# Patient Record
Sex: Female | Born: 1950 | Race: Black or African American | Hispanic: No | Marital: Single | Smoking: Former smoker
Health system: Southern US, Community
[De-identification: ages and names within clinical notes are randomized; demographics above are authoritative.]

## PROBLEM LIST (undated history)

## (undated) DIAGNOSIS — H409 Unspecified glaucoma: Secondary | ICD-10-CM

## (undated) DIAGNOSIS — I10 Essential (primary) hypertension: Secondary | ICD-10-CM

## (undated) DIAGNOSIS — H269 Unspecified cataract: Secondary | ICD-10-CM

## (undated) DIAGNOSIS — F329 Major depressive disorder, single episode, unspecified: Secondary | ICD-10-CM

## (undated) DIAGNOSIS — E669 Obesity, unspecified: Secondary | ICD-10-CM

## (undated) DIAGNOSIS — E119 Type 2 diabetes mellitus without complications: Secondary | ICD-10-CM

## (undated) DIAGNOSIS — F419 Anxiety disorder, unspecified: Secondary | ICD-10-CM

## (undated) DIAGNOSIS — F32A Depression, unspecified: Secondary | ICD-10-CM

## (undated) DIAGNOSIS — M1990 Unspecified osteoarthritis, unspecified site: Secondary | ICD-10-CM

## (undated) DIAGNOSIS — I639 Cerebral infarction, unspecified: Secondary | ICD-10-CM

## (undated) DIAGNOSIS — E785 Hyperlipidemia, unspecified: Secondary | ICD-10-CM

## (undated) HISTORY — PX: JOINT REPLACEMENT: SHX530

## (undated) HISTORY — DX: Major depressive disorder, single episode, unspecified: F32.9

## (undated) HISTORY — DX: Depression, unspecified: F32.A

## (undated) HISTORY — DX: Anxiety disorder, unspecified: F41.9

## (undated) HISTORY — DX: Type 2 diabetes mellitus without complications: E11.9

## (undated) HISTORY — DX: Essential (primary) hypertension: I10

## (undated) HISTORY — DX: Obesity, unspecified: E66.9

## (undated) HISTORY — PX: TUBAL LIGATION: SHX77

## (undated) HISTORY — DX: Hyperlipidemia, unspecified: E78.5

---

## 1997-05-12 ENCOUNTER — Other Ambulatory Visit: Admission: RE | Admit: 1997-05-12 | Discharge: 1997-05-12 | Payer: Self-pay | Admitting: Family Medicine

## 1998-12-15 ENCOUNTER — Ambulatory Visit (HOSPITAL_COMMUNITY): Admission: RE | Admit: 1998-12-15 | Discharge: 1998-12-15 | Payer: Self-pay | Admitting: Family Medicine

## 1999-04-04 ENCOUNTER — Emergency Department (HOSPITAL_COMMUNITY): Admission: EM | Admit: 1999-04-04 | Discharge: 1999-04-04 | Payer: Self-pay | Admitting: Emergency Medicine

## 1999-04-04 ENCOUNTER — Encounter: Payer: Self-pay | Admitting: Emergency Medicine

## 1999-07-28 ENCOUNTER — Encounter: Payer: Self-pay | Admitting: Emergency Medicine

## 1999-07-28 ENCOUNTER — Emergency Department (HOSPITAL_COMMUNITY): Admission: EM | Admit: 1999-07-28 | Discharge: 1999-07-28 | Payer: Self-pay | Admitting: Emergency Medicine

## 1999-09-08 ENCOUNTER — Encounter: Admission: RE | Admit: 1999-09-08 | Discharge: 1999-12-07 | Payer: Self-pay | Admitting: Orthopedic Surgery

## 1999-10-19 ENCOUNTER — Ambulatory Visit (HOSPITAL_COMMUNITY): Admission: RE | Admit: 1999-10-19 | Discharge: 1999-10-19 | Payer: Self-pay | Admitting: Ophthalmology

## 1999-10-19 ENCOUNTER — Encounter: Payer: Self-pay | Admitting: Ophthalmology

## 1999-12-20 ENCOUNTER — Ambulatory Visit (HOSPITAL_COMMUNITY): Admission: RE | Admit: 1999-12-20 | Discharge: 1999-12-20 | Payer: Self-pay

## 2000-10-04 ENCOUNTER — Emergency Department (HOSPITAL_COMMUNITY): Admission: EM | Admit: 2000-10-04 | Discharge: 2000-10-04 | Payer: Self-pay | Admitting: Emergency Medicine

## 2000-10-20 ENCOUNTER — Ambulatory Visit (HOSPITAL_COMMUNITY): Admission: RE | Admit: 2000-10-20 | Discharge: 2000-10-20 | Payer: Self-pay | Admitting: Family Medicine

## 2001-01-12 ENCOUNTER — Other Ambulatory Visit: Admission: RE | Admit: 2001-01-12 | Discharge: 2001-01-12 | Payer: Self-pay | Admitting: Family Medicine

## 2001-01-25 ENCOUNTER — Ambulatory Visit (HOSPITAL_COMMUNITY): Admission: RE | Admit: 2001-01-25 | Discharge: 2001-01-25 | Payer: Self-pay | Admitting: Family Medicine

## 2003-02-20 ENCOUNTER — Encounter: Admission: RE | Admit: 2003-02-20 | Discharge: 2003-03-04 | Payer: Self-pay | Admitting: Internal Medicine

## 2004-10-31 ENCOUNTER — Emergency Department (HOSPITAL_COMMUNITY): Admission: EM | Admit: 2004-10-31 | Discharge: 2004-10-31 | Payer: Self-pay | Admitting: Emergency Medicine

## 2004-11-01 ENCOUNTER — Ambulatory Visit: Payer: Self-pay | Admitting: Nurse Practitioner

## 2004-11-10 ENCOUNTER — Ambulatory Visit (HOSPITAL_COMMUNITY): Admission: RE | Admit: 2004-11-10 | Discharge: 2004-11-10 | Payer: Self-pay | Admitting: Emergency Medicine

## 2004-11-15 ENCOUNTER — Ambulatory Visit: Payer: Self-pay | Admitting: Nurse Practitioner

## 2004-11-29 ENCOUNTER — Ambulatory Visit: Payer: Self-pay | Admitting: Nurse Practitioner

## 2004-12-28 ENCOUNTER — Ambulatory Visit: Payer: Self-pay | Admitting: Nurse Practitioner

## 2005-01-11 ENCOUNTER — Ambulatory Visit: Payer: Self-pay | Admitting: Nurse Practitioner

## 2005-02-11 ENCOUNTER — Ambulatory Visit: Payer: Self-pay | Admitting: Nurse Practitioner

## 2005-03-29 ENCOUNTER — Ambulatory Visit: Payer: Self-pay | Admitting: Nurse Practitioner

## 2005-09-20 ENCOUNTER — Ambulatory Visit: Payer: Self-pay | Admitting: Nurse Practitioner

## 2005-10-20 ENCOUNTER — Ambulatory Visit: Payer: Self-pay | Admitting: Nurse Practitioner

## 2005-10-24 ENCOUNTER — Ambulatory Visit (HOSPITAL_COMMUNITY): Admission: RE | Admit: 2005-10-24 | Discharge: 2005-10-24 | Payer: Self-pay | Admitting: Internal Medicine

## 2005-10-24 DIAGNOSIS — M949 Disorder of cartilage, unspecified: Secondary | ICD-10-CM

## 2005-10-24 DIAGNOSIS — M899 Disorder of bone, unspecified: Secondary | ICD-10-CM | POA: Insufficient documentation

## 2006-05-19 ENCOUNTER — Ambulatory Visit: Payer: Self-pay | Admitting: Family Medicine

## 2006-06-06 ENCOUNTER — Encounter (INDEPENDENT_AMBULATORY_CARE_PROVIDER_SITE_OTHER): Payer: Self-pay | Admitting: Nurse Practitioner

## 2006-06-06 ENCOUNTER — Other Ambulatory Visit: Admission: RE | Admit: 2006-06-06 | Discharge: 2006-06-06 | Payer: Self-pay | Admitting: Family Medicine

## 2006-06-06 ENCOUNTER — Ambulatory Visit: Payer: Self-pay | Admitting: Nurse Practitioner

## 2006-10-26 ENCOUNTER — Ambulatory Visit (HOSPITAL_COMMUNITY): Admission: RE | Admit: 2006-10-26 | Discharge: 2006-10-26 | Payer: Self-pay | Admitting: Family Medicine

## 2007-02-15 ENCOUNTER — Ambulatory Visit: Payer: Self-pay | Admitting: Internal Medicine

## 2007-09-19 ENCOUNTER — Encounter (INDEPENDENT_AMBULATORY_CARE_PROVIDER_SITE_OTHER): Payer: Self-pay | Admitting: Family Medicine

## 2007-09-19 ENCOUNTER — Ambulatory Visit: Payer: Self-pay | Admitting: Family Medicine

## 2007-09-19 LAB — CONVERTED CEMR LAB
AST: 15 units/L (ref 0–37)
Alkaline Phosphatase: 73 units/L (ref 39–117)
BUN: 24 mg/dL — ABNORMAL HIGH (ref 6–23)
Basophils Absolute: 0 10*3/uL (ref 0.0–0.1)
Basophils Relative: 0 % (ref 0–1)
Creatinine, Ser: 1.1 mg/dL (ref 0.40–1.20)
Eosinophils Relative: 2 % (ref 0–5)
HCT: 41.1 % (ref 36.0–46.0)
HDL: 56 mg/dL (ref >39)
Hemoglobin: 13 g/dL (ref 12.0–15.0)
LDL Cholesterol: 117 mg/dL — ABNORMAL HIGH (ref 0–99)
MCHC: 31.6 g/dL (ref 30.0–36.0)
Monocytes Absolute: 0.5 10*3/uL (ref 0.1–1.0)
RDW: 14 % (ref 11.5–15.5)
TSH: 0.536 microintl units/mL (ref 0.350–4.50)
Total CHOL/HDL Ratio: 3.4

## 2007-09-24 ENCOUNTER — Ambulatory Visit: Payer: Self-pay | Admitting: Internal Medicine

## 2007-09-25 ENCOUNTER — Encounter (INDEPENDENT_AMBULATORY_CARE_PROVIDER_SITE_OTHER): Payer: Self-pay | Admitting: Internal Medicine

## 2007-09-28 ENCOUNTER — Telehealth (INDEPENDENT_AMBULATORY_CARE_PROVIDER_SITE_OTHER): Payer: Self-pay | Admitting: Internal Medicine

## 2007-10-03 ENCOUNTER — Encounter (INDEPENDENT_AMBULATORY_CARE_PROVIDER_SITE_OTHER): Payer: Self-pay | Admitting: Internal Medicine

## 2007-10-09 ENCOUNTER — Encounter (INDEPENDENT_AMBULATORY_CARE_PROVIDER_SITE_OTHER): Payer: Self-pay | Admitting: Nurse Practitioner

## 2007-10-31 ENCOUNTER — Ambulatory Visit: Payer: Self-pay | Admitting: Internal Medicine

## 2007-10-31 DIAGNOSIS — I10 Essential (primary) hypertension: Secondary | ICD-10-CM | POA: Insufficient documentation

## 2007-11-08 ENCOUNTER — Encounter (INDEPENDENT_AMBULATORY_CARE_PROVIDER_SITE_OTHER): Payer: Self-pay | Admitting: Internal Medicine

## 2007-11-08 ENCOUNTER — Telehealth (INDEPENDENT_AMBULATORY_CARE_PROVIDER_SITE_OTHER): Payer: Self-pay | Admitting: *Deleted

## 2007-11-08 ENCOUNTER — Ambulatory Visit (HOSPITAL_COMMUNITY): Admission: RE | Admit: 2007-11-08 | Discharge: 2007-11-08 | Payer: Self-pay | Admitting: Family Medicine

## 2007-11-09 ENCOUNTER — Telehealth (INDEPENDENT_AMBULATORY_CARE_PROVIDER_SITE_OTHER): Payer: Self-pay | Admitting: Internal Medicine

## 2007-11-22 ENCOUNTER — Emergency Department (HOSPITAL_COMMUNITY): Admission: EM | Admit: 2007-11-22 | Discharge: 2007-11-22 | Payer: Self-pay | Admitting: Emergency Medicine

## 2007-12-07 ENCOUNTER — Ambulatory Visit: Payer: Self-pay | Admitting: Internal Medicine

## 2007-12-11 ENCOUNTER — Encounter (INDEPENDENT_AMBULATORY_CARE_PROVIDER_SITE_OTHER): Payer: Self-pay | Admitting: Internal Medicine

## 2007-12-11 LAB — CONVERTED CEMR LAB: Calcium: 9.8 mg/dL (ref 8.4–10.5)

## 2008-04-04 ENCOUNTER — Ambulatory Visit: Payer: Self-pay | Admitting: Internal Medicine

## 2008-04-04 ENCOUNTER — Other Ambulatory Visit: Admission: RE | Admit: 2008-04-04 | Discharge: 2008-04-04 | Payer: Self-pay | Admitting: Internal Medicine

## 2008-04-04 ENCOUNTER — Encounter (INDEPENDENT_AMBULATORY_CARE_PROVIDER_SITE_OTHER): Payer: Self-pay | Admitting: Internal Medicine

## 2008-04-04 DIAGNOSIS — F319 Bipolar disorder, unspecified: Secondary | ICD-10-CM | POA: Insufficient documentation

## 2008-04-04 LAB — CONVERTED CEMR LAB
ALT: 27 units/L (ref 0–35)
AST: 22 units/L (ref 0–37)
Albumin: 4.4 g/dL (ref 3.5–5.2)
Alkaline Phosphatase: 105 units/L (ref 39–117)
Bilirubin Urine: NEGATIVE
Glucose, Bld: 78 mg/dL (ref 70–99)
Ketones, urine, test strip: NEGATIVE
Potassium: 4.2 meq/L (ref 3.5–5.3)
Sodium: 141 meq/L (ref 135–145)
Total Protein: 7.3 g/dL (ref 6.0–8.3)
Urobilinogen, UA: 0.2
pH: 5

## 2008-04-13 ENCOUNTER — Encounter (INDEPENDENT_AMBULATORY_CARE_PROVIDER_SITE_OTHER): Payer: Self-pay | Admitting: Internal Medicine

## 2008-10-31 ENCOUNTER — Telehealth (INDEPENDENT_AMBULATORY_CARE_PROVIDER_SITE_OTHER): Payer: Self-pay | Admitting: Internal Medicine

## 2008-11-05 ENCOUNTER — Encounter (INDEPENDENT_AMBULATORY_CARE_PROVIDER_SITE_OTHER): Payer: Self-pay | Admitting: Internal Medicine

## 2008-11-10 ENCOUNTER — Ambulatory Visit (HOSPITAL_COMMUNITY): Admission: RE | Admit: 2008-11-10 | Discharge: 2008-11-10 | Payer: Self-pay | Admitting: Internal Medicine

## 2009-04-08 ENCOUNTER — Telehealth: Payer: Self-pay | Admitting: Physician Assistant

## 2009-05-05 ENCOUNTER — Ambulatory Visit: Payer: Self-pay | Admitting: Internal Medicine

## 2009-05-05 DIAGNOSIS — M25569 Pain in unspecified knee: Secondary | ICD-10-CM

## 2009-05-05 DIAGNOSIS — R635 Abnormal weight gain: Secondary | ICD-10-CM

## 2009-05-05 DIAGNOSIS — R809 Proteinuria, unspecified: Secondary | ICD-10-CM | POA: Insufficient documentation

## 2009-05-05 LAB — CONVERTED CEMR LAB
ALT: 13 units/L (ref 0–35)
Alkaline Phosphatase: 123 units/L — ABNORMAL HIGH (ref 39–117)
Basophils Absolute: 0 10*3/uL (ref 0.0–0.1)
Basophils Relative: 1 % (ref 0–1)
Creatinine, Ser: 0.94 mg/dL (ref 0.40–1.20)
LDL Cholesterol: 142 mg/dL — ABNORMAL HIGH (ref 0–99)
MCHC: 30.6 g/dL (ref 30.0–36.0)
Neutro Abs: 2.1 10*3/uL (ref 1.7–7.7)
Neutrophils Relative %: 49 % (ref 43–77)
Platelets: 297 10*3/uL (ref 150–400)
RBC: 5 M/uL (ref 3.87–5.11)
RDW: 15.4 % (ref 11.5–15.5)
Sodium: 142 meq/L (ref 135–145)
Total Bilirubin: 0.5 mg/dL (ref 0.3–1.2)
Total CHOL/HDL Ratio: 4.8
Total Protein: 7 g/dL (ref 6.0–8.3)
VLDL: 20 mg/dL (ref 0–40)

## 2009-05-08 ENCOUNTER — Encounter (INDEPENDENT_AMBULATORY_CARE_PROVIDER_SITE_OTHER): Payer: Self-pay | Admitting: Internal Medicine

## 2009-05-13 ENCOUNTER — Ambulatory Visit (HOSPITAL_COMMUNITY): Admission: RE | Admit: 2009-05-13 | Discharge: 2009-05-13 | Payer: Self-pay | Admitting: Internal Medicine

## 2009-05-14 ENCOUNTER — Ambulatory Visit: Payer: Self-pay | Admitting: Internal Medicine

## 2009-05-14 LAB — CONVERTED CEMR LAB
Bilirubin Urine: NEGATIVE
Ketones, urine, test strip: NEGATIVE
Nitrite: NEGATIVE
Specific Gravity, Urine: <1.005
pH: 5

## 2009-05-21 DIAGNOSIS — IMO0002 Reserved for concepts with insufficient information to code with codable children: Secondary | ICD-10-CM | POA: Insufficient documentation

## 2009-05-21 DIAGNOSIS — M1710 Unilateral primary osteoarthritis, unspecified knee: Secondary | ICD-10-CM

## 2009-05-22 ENCOUNTER — Ambulatory Visit: Payer: Self-pay | Admitting: Internal Medicine

## 2009-06-21 ENCOUNTER — Encounter (INDEPENDENT_AMBULATORY_CARE_PROVIDER_SITE_OTHER): Payer: Self-pay | Admitting: Internal Medicine

## 2009-06-21 DIAGNOSIS — N259 Disorder resulting from impaired renal tubular function, unspecified: Secondary | ICD-10-CM | POA: Insufficient documentation

## 2009-06-21 LAB — CONVERTED CEMR LAB
Collection Interval-CRCL: 24 hr
Creatinine 24 HR UR: 707 mg/24hr (ref 700–1800)
Creatinine Clearance: 53 mL/min — ABNORMAL LOW (ref 75–115)

## 2009-07-07 ENCOUNTER — Other Ambulatory Visit: Admission: RE | Admit: 2009-07-07 | Discharge: 2009-07-07 | Payer: Self-pay | Admitting: Internal Medicine

## 2009-07-07 ENCOUNTER — Ambulatory Visit: Payer: Self-pay | Admitting: Internal Medicine

## 2009-07-07 DIAGNOSIS — M2550 Pain in unspecified joint: Secondary | ICD-10-CM | POA: Insufficient documentation

## 2009-07-07 DIAGNOSIS — N898 Other specified noninflammatory disorders of vagina: Secondary | ICD-10-CM | POA: Insufficient documentation

## 2009-07-07 LAB — CONVERTED CEMR LAB
Anti Nuclear Antibody(ANA): NEGATIVE
Bilirubin Urine: NEGATIVE
Chlamydia, DNA Probe: NEGATIVE
GC Probe Amp, Genital: NEGATIVE
Ketones, urine, test strip: NEGATIVE
Nitrite: NEGATIVE
Protein, U semiquant: >=300
Rhuematoid fact SerPl-aCnc: 20 intl units/mL (ref 0–20)
Sed Rate: 14 mm/hr (ref 0–22)
Urobilinogen, UA: 0.2
Whiff Test: NEGATIVE
pH: 5.5

## 2009-07-10 ENCOUNTER — Ambulatory Visit (HOSPITAL_COMMUNITY): Admission: RE | Admit: 2009-07-10 | Discharge: 2009-07-10 | Payer: Self-pay | Admitting: Internal Medicine

## 2009-07-14 ENCOUNTER — Encounter (INDEPENDENT_AMBULATORY_CARE_PROVIDER_SITE_OTHER): Payer: Self-pay | Admitting: Internal Medicine

## 2009-07-21 ENCOUNTER — Ambulatory Visit: Payer: Self-pay | Admitting: Internal Medicine

## 2009-07-23 LAB — CONVERTED CEMR LAB
BUN: 11 mg/dL (ref 6–23)
Calcium: 9.6 mg/dL (ref 8.4–10.5)
Creatinine, Ser: 0.86 mg/dL (ref 0.40–1.20)
Glucose, Bld: 106 mg/dL — ABNORMAL HIGH (ref 70–99)
Potassium: 3.8 meq/L (ref 3.5–5.3)

## 2009-08-03 ENCOUNTER — Telehealth (INDEPENDENT_AMBULATORY_CARE_PROVIDER_SITE_OTHER): Payer: Self-pay | Admitting: Internal Medicine

## 2009-08-04 ENCOUNTER — Ambulatory Visit: Payer: Self-pay | Admitting: Internal Medicine

## 2009-08-04 LAB — CONVERTED CEMR LAB
BUN: 16 mg/dL (ref 6–23)
CO2: 24 meq/L (ref 19–32)
Calcium: 10.2 mg/dL (ref 8.4–10.5)
Chloride: 104 meq/L (ref 96–112)
Creatinine, Ser: 0.7 mg/dL (ref 0.40–1.20)
Glucose, Bld: 75 mg/dL (ref 70–99)
Potassium: 4 meq/L (ref 3.5–5.3)
Sodium: 140 meq/L (ref 135–145)

## 2009-10-16 ENCOUNTER — Ambulatory Visit: Payer: Self-pay | Admitting: Internal Medicine

## 2009-10-16 DIAGNOSIS — K089 Disorder of teeth and supporting structures, unspecified: Secondary | ICD-10-CM | POA: Insufficient documentation

## 2009-11-13 ENCOUNTER — Ambulatory Visit (HOSPITAL_COMMUNITY): Admission: RE | Admit: 2009-11-13 | Discharge: 2009-11-13 | Payer: Self-pay | Admitting: Internal Medicine

## 2009-11-17 ENCOUNTER — Ambulatory Visit: Payer: Self-pay | Admitting: Internal Medicine

## 2009-11-17 LAB — CONVERTED CEMR LAB
CO2: 26 meq/L (ref 19–32)
Calcium: 9.7 mg/dL (ref 8.4–10.5)
Chloride: 106 meq/L (ref 96–112)
Glucose, Bld: 94 mg/dL (ref 70–99)
Potassium: 3.8 meq/L (ref 3.5–5.3)
Sodium: 141 meq/L (ref 135–145)

## 2009-11-27 ENCOUNTER — Ambulatory Visit: Payer: Self-pay | Admitting: Internal Medicine

## 2009-12-28 ENCOUNTER — Telehealth (INDEPENDENT_AMBULATORY_CARE_PROVIDER_SITE_OTHER): Payer: Self-pay | Admitting: Internal Medicine

## 2010-01-21 ENCOUNTER — Ambulatory Visit
Admission: RE | Admit: 2010-01-21 | Discharge: 2010-01-21 | Payer: Self-pay | Source: Home / Self Care | Attending: Internal Medicine | Admitting: Internal Medicine

## 2010-02-22 ENCOUNTER — Ambulatory Visit: Admit: 2010-02-22 | Payer: Self-pay | Admitting: Internal Medicine

## 2010-02-23 NOTE — Assessment & Plan Note (Signed)
Summary: 1 WEEK FU FOR BP CHECK///KT  Nurse Visit   Vital Signs:  Patient profile:   60 year old female Menstrual status:  postmenopausal Pulse rate:   56 / minute Pulse rhythm:   regular Resp:     20 per minute BP sitting:   156 / 92  (right arm) Cuff size:   large  Vitals Entered By: Dutch Quint RN (November 27, 2009 11:51 AM)  Impression & Recommendations:  Problem # 1:  HYPERTENSION (ICD-401.9) Rx was not changed as per plan due to low pulse Repeat blood pressure and pulse check in one week. Asymptomatic   The following medications were removed from the medication list:    Clonidine Hcl 0.2 Mg Tabs (Clonidine hcl) .Marland Kitchen... Take one tablet by mouth two times a day for blood pressure Her updated medication list for this problem includes:    Lisinopril 40 Mg Tabs (Lisinopril) .Marland Kitchen... 1 tab by mouth daily    Amlodipine Besylate 10 Mg Tabs (Amlodipine besylate) .Marland Kitchen... 1 tab by mouth daily    Atenolol 50 Mg Tabs (Atenolol) .Marland Kitchen... 1 tab daily by mouth    Clonidine Hcl 0.1 Mg Tabs (Clonidine hcl) .Marland Kitchen... Take one tablet by mouth in the morning and two at nght for blood pressure  Complete Medication List: 1)  Clonazepam 1 Mg Tabs (Clonazepam) .Marland Kitchen.. 1 tab by mouth at hs  dr. Katrinka Blazing 2)  Lisinopril 40 Mg Tabs (Lisinopril) .Marland Kitchen.. 1 tab by mouth daily 3)  Amlodipine Besylate 10 Mg Tabs (Amlodipine besylate) .Marland Kitchen.. 1 tab by mouth daily 4)  Atenolol 50 Mg Tabs (Atenolol) .Marland Kitchen.. 1 tab daily by mouth 5)  Seroquel Xr 150 Mg Xr24h-tab (Quetiapine fumarate) .Marland Kitchen.. 1 tab by mouth at 7 pm dr. Katrinka Blazing 6)  Sertraline Hcl 50 Mg Tabs (Sertraline hcl) .Marland Kitchen.. 1 tab by mouth q hs dr. Katrinka Blazing 7)  Clonidine Hcl 0.1 Mg Tabs (Clonidine hcl) .... Take one tablet by mouth in the morning and two at nght for blood pressure   Patient Instructions: 1)  Reviewed with Dr. Delrae Alfred 2)  Your blood pressure is still high today 3)  Return in one week for blood pressure and pulse check with triage nurse. 4)  Call if anything changes or  if you have any questions.   CC:  BP recheck.  History of Present Illness: Took all medications about 9:30 am.  On 11/20/09 BP was 178/100.  States asymptomatic.  Denies cough, headache, peripheral edema.   Review of Systems CV:  States asymptomatic.   Physical Exam  Lungs:  normal respiratory effort, normal breath sounds, no crackles, and no wheezes.   Heart:  normal rate and regular rhythm.    CC: BP recheck Is Patient Diabetic? No Pain Assessment Patient in pain? yes     Location: left arm, hand, knee Type: aching Onset of pain  Chronic  Does patient need assistance? Functional Status Self care Ambulation Normal   Allergies: 1)  ! Codeine  Orders Added: 1)  Est. Patient Level I [60454] Prescriptions: CLONIDINE HCL 0.2 MG TABS (CLONIDINE HCL) Take one tablet by mouth two times a day for blood pressure  #60 x 3   Entered by:   Dutch Quint RN   Authorized by:   Julieanne Manson MD   Signed by:   Dutch Quint RN on 11/27/2009   Method used:   Electronically to        [facility] [id number] (retail)       [phone]  [address]       [place], [place]  [address]       Ph: [phone]       Fax: [phone]   RxID:   [id number]

## 2010-02-23 NOTE — Progress Notes (Signed)
Summary: MEDS REFILL  Phone Note Refill Request Call back at [phone]   Refills Requested: Medication #1:  SERTRALINE HCL 50 MG TABS 1 tab by mouth q hs Dr. Katrinka Blazing [facility]   Initial call taken by: Domenic Polite,  December 28, 2009 12:00 PM  Follow-up for Phone Call        Pt[facility] -- advised that she needs to call there for refill.  Dutch Quint RN  December 28, 2009 5:51 PM

## 2010-02-23 NOTE — Progress Notes (Signed)
Summary: Legs are swelling  Phone Note Call from Patient   Summary of Call: SHE IS HAVEN'T A HARD TIME,WITH STRESS .THE PILLS SHE IS TAKING IS MAKING HER LEGS SWELLING.HAS BEEN FACING DEATH IN HER FAMILY Initial call taken by: Arta Bruce,  August 03, 2009 4:26 PM  Follow-up for Phone Call        PHONE  [phone]   Follow-up by: Arta Bruce,  August 03, 2009 4:27 PM  Additional Follow-up for Phone Call Additional follow up Details #1::        Has appt. for BP and BMET 08/04/09-- stressed importance that she keep appt. -- requested to come in around 11:30  in the AM -- advised that we will fit her in, as her sister is in the hospital and she has several family issues going on -- not sure if she can make the afternoon appt.  Dutch Quint RN  August 03, 2009 5:49 PM     Additional Follow-up for Phone Call Additional follow up Details #2::    In office this morning for nurse visit.  Dutch Quint RN  August 04, 2009 11:17 AM  Follow-up by: Dutch Quint RN,  August 04, 2009 11:17 AM

## 2010-02-23 NOTE — Assessment & Plan Note (Signed)
Summary: CPP EXAM///GK   Vital Signs:  Patient profile:   60 year old female Menstrual status:  postmenopausal Weight:      185 pounds Temp:     97.8 degrees F Pulse rate:   51 / minute Pulse rhythm:   regular Resp:     20 per minute BP sitting:   172 / 100  (left arm) Cuff size:   regular  Vitals Entered By: Vesta Mixer CMA (July 07, 2009 11:07 AM) CC: CPP.  Last mammo October 2010. Is Patient Diabetic? No Pain Assessment Patient in pain? no       Does patient need assistance? Ambulation Normal   CC:  CPP.  Last mammo October 2010.Marland Kitchen  History of Present Illness: 60 yo female here for CPP.  Concerns:    1.  Arthritis :  feet and fingers still bothering her.  Has not tried Tylenol or ibuprofen.  Really started bothering her more so 1 month ago.    2.  Hypertension:  stressed.  Has not missed meds.  Lost sister-in-law in MVA last month, nephew died of cancer recently.  Sister (pt's niece) of nephew--her daughter just severely injured in MVA yesterday.    Habits & Providers  Alcohol-Tobacco-Diet     Alcohol drinks/day: 0     Tobacco Status: quit maybe in 2006     Year Started: started in 20s  Exercise-Depression-Behavior     Drug Use: never  Allergies (verified): 1)  ! Codeine  Past History:  Past Medical History: RENAL INSUFFICIENCY (ICD-588.9) DEGENERATIVE JOINT DISEASE, LEFT KNEE (ICD-715.96) PROTEINURIA (ICD-791.0) WEIGHT GAIN (ICD-783.1) KNEE PAIN, LEFT (ICD-719.46) ROUTINE GYNECOLOGICAL EXAMINATION (ICD-V72.31) ENCOUNTER FOR LONG-TERM USE OF OTHER MEDICATIONS (ICD-V58.69) BIPOLAR DISORDER UNSPECIFIED (ICD-296.80) OSTEOPENIA (ICD-733.90) HYPERTENSION (ICD-401.9) HYPERCALCEMIA (ICD-275.42)    Past Surgical History: Reviewed history from 04/04/2008 and no changes required. 1.  1983:  BTL  Family History: Reviewed history from 04/04/2008 and no changes required. Mother, died 26s:  Hypertension--unknown cause of death--pt. was young. Father,  died unknown age--pt. was young 21 Brothers:  one living.  Twin brother hit by a train-age 70.  Others died secondary to alcoholism, diabetic complications.  Brother living with DM, on dialysis 2 sisters:  1 with DM.  1 with multiple unknown health concerns. 3 sets of twins in pt's siblings 4 children:  43, 41, 39, and 27.  All healthy  Social History: Divorced Lives alone--but now caring for son's child--child now in custody of pt's daughter.  Baby's mother a drug addict.   Currently not working Worked in [employer] previously. No partner. Not sexually active Quit smoking many years ago.  Cannot say how many years she smoked. No exerciseSmoking Status:  quit maybe in 2006 Drug Use:  never  Review of Systems General:  Energy up and down.. Eyes:  reading glasses--stable with those. ENT:  Denies decreased hearing. CV:  Denies chest pain or discomfort and palpitations. Resp:  Dyspnea when tired and walks a lot;. GI:  Denies abdominal pain, bloody stools, constipation, dark tarry stools, and diarrhea. GU:  Complains of urinary frequency; denies dysuria; white vaginal discharge with itching.  Bad odor.  Has noted for about 3 weeks.. MS:  See HPI--hands, feet and knees with discomfort.  Never red and swollen.. Derm:  Denies lesion(s) and rash. Neuro:  Denies numbness, tingling, and weakness. Psych:  Scored 10 on PQH 9.  Working on this with [employer].Marland Kitchen  Physical Exam  General:  overweight, NAD Head:  Normocephalic and atraumatic  without obvious abnormalities. No apparent alopecia or balding. Eyes:  No corneal or conjunctival inflammation noted. EOMI. Perrla. Funduscopic exam benign, without hemorrhages, exudates or papilledema. Vision grossly normal. Ears:  External ear exam shows no significant lesions or deformities.  Otoscopic examination reveals clear canals, tympanic membranes are intact bilaterally without bulging, retraction, inflammation or discharge. Hearing is  grossly normal bilaterally. Nose:  External nasal examination shows no deformity or inflammation. Nasal mucosa are pink and moist without lesions or exudates. Mouth:  Oral mucosa and oropharynx without lesions or exudates.  poor dentition.   Neck:  No deformities, masses, or tenderness noted. Breasts:  No mass, nodules, thickening, tenderness, bulging, retraction, inflamation, nipple discharge or skin changes noted.   Lungs:  Normal respiratory effort, chest expands symmetrically. Lungs are clear to auscultation, no crackles or wheezes. Heart:  Normal rate and regular rhythm. S1 and S2 normal without gallop, murmur, click, rub or other extra sounds. Abdomen:  Abdomen obese and protuberant.soft, non-tender, normal bowel sounds, no distention, no masses, no hepatomegaly, and no splenomegaly.   Rectal:  No external abnormalities noted. Normal sphincter tone. No rectal masses or tenderness.  Heme negative light brown stool. Genitalia:  Pelvic Exam:        External: normal female genitalia without lesions or masses        Vagina: normal without lesions or masses.  Light yellow frothy discharge without odor        Cervix: normal without lesions or masses        Adnexa: normal bimanual exam without masses or fullness        Uterus: Either enlarged or quite anteverted or both.        Pap smear: performed Msk:  No deformity or scoliosis noted of thoracic or lumbar spine.   Pulses:  R and L carotid,radial,femoral,dorsalis pedis and posterior tibial pulses are full and equal bilaterally Extremities:  No obvious synovial thickening of any of joints of fingers, hand, wrist, toes, feet , or ankles, but MCPs of hands, particularly of fingers 2-4 appear hypertrophied, possibly with ulnar deviation of fingers on right.  Knees with hypertrophic changes as well. Neurologic:  No cranial nerve deficits noted. Station and gait are normal. Plantar reflexes are down-going bilaterally. DTRs are symmetrical throughout.  Sensory, motor and coordinative functions appear intact. Skin:  Intact without suspicious lesions or rashes Cervical Nodes:  No lymphadenopathy noted Axillary Nodes:  No palpable lymphadenopathy Inguinal Nodes:  No significant adenopathy Psych:  Cognition and judgment appear intact. Alert and cooperative with normal attention span and concentration. No apparent delusions, illusions, hallucinations   Impression & Recommendations:  Problem # 1:  ROUTINE GYNECOLOGICAL EXAMINATION (ICD-V72.31)  Orders: Mammogram (Screening) (Mammo)--for October KOH/ [place] [id number]) Pap Smear, Thin Prep ( Collection of) (J8119) UA Dipstick w/o Micro (manual) (14782) T-Pap Smear, Thin Prep (95621)  Problem # 2:  HEALTH SCREENING (ICD-V70.0) Guaiac cards x 3  to return in 2 weeks. Immunizations up to date Orders: Gastroenterology Referral (GI) for screening colonoscopy  Problem # 3:  HYPERTENSION (ICD-401.9) Not controlled. Switch Nifediac to Amlodipine 10 mg Discussed importance of controlling bp to prevent progression of Renal insufficiency Her updated medication list for this problem includes:    Clonidine Hcl 0.1 Mg Tabs (Clonidine hcl) .Marland Kitchen... Take one tab every night at bedtime    Lisinopril 20 Mg Tabs (Lisinopril) .Marland Kitchen... Take one tab once daily    Amlodipine Besylate 10 Mg Tabs (Amlodipine besylate) .Marland Kitchen... 1 tab by mouth daily  Atenolol 50 Mg Tabs (Atenolol) .Marland Kitchen... 1 tab daily by mouth  Problem # 4:  ARTHRALGIA (ICD-719.40) Suspect OA, but some minimal changes to suggest inflammatory arthritis. Orders: T-Rheumatoid Factor [phone]) T-Antinuclear Antib (ANA) [id number]) T-Sed Rate (Automated) [id number])  Problem # 5:  VAGINAL DISCHARGE (ICD-623.5) No defintive dx via wet prep Orders: T- GC Chlamydia (57846) T-HIV Antibody  (Reflex) ([id number])  Problem # 6:  UTERINE ENLARGEMENT (ICD-621.2)  Orders: Ultrasound (Ultrasound)  Problem # 7:  OSTEOPENIA  (ICD-733.90)  Orders: Dexa scan (Dexa scan)  Complete Medication List: 1)  Clonazepam 1 Mg Tabs (Clonazepam) .Marland Kitchen.. 1 tab by mouth at hs  dr. Katrinka Blazing 2)  Clonidine Hcl 0.1 Mg Tabs (Clonidine hcl) .... Take one tab every night at bedtime 3)  Lisinopril 20 Mg Tabs (Lisinopril) .... Take one tab once daily 4)  Amlodipine Besylate 10 Mg Tabs (Amlodipine besylate) .Marland Kitchen.. 1 tab by mouth daily 5)  Atenolol 50 Mg Tabs (Atenolol) .Marland Kitchen.. 1 tab daily by mouth 6)  Seroquel Xr 150 Mg Xr24h-tab (Quetiapine fumarate) .Marland Kitchen.. 1 tab by mouth at 7 pm dr. Katrinka Blazing 7)  Sertraline Hcl 50 Mg Tabs (Sertraline hcl) .Marland Kitchen.. 1 tab by mouth q hs dr. Katrinka Blazing  Patient Instructions: 1)  BP check in 2 weeks--nurse visit. 2)  BMET with BP check 3)  Follow up with Dr. Delrae Alfred in 3 months--hypertension Prescriptions: AMLODIPINE BESYLATE 10 MG TABS (AMLODIPINE BESYLATE) 1 tab by mouth daily  #30 x 11   Entered and Authorized by:   Julieanne Manson MD   Signed by:   Julieanne Manson MD on 07/07/2009   Method used:   Electronically to        [facility] [id number] (retail)       [address] [place], [place]  [address]       Ph: [phone]       Fax: [phone]   RxID:   [id number]    Preventive Care Screening  Prior Values:    Pap Smear:  NEGATIVE FOR INTRAEPITHELIAL LESIONS OR MALIGNANCY. (04/04/2008)    Mammogram:  ASSESSMENT: Negative - BI-RADS 1^MM DIGITAL SCREENING (11/10/2008)    Last Tetanus Booster:  Tdap (04/04/2008)     SBE:  Daily--no concerns LMP:  age 19 Osteoprevention:  Eats 5-6 cups of yogurt daily.  Not much physical activity with family loss recently. Guaiac cards:  cannot recall last time Colonoscopy:  never.  Laboratory Results   Urine Tests    Routine Urinalysis   Glucose: negative   (Normal Range: Negative) Bilirubin: negative   (Normal Range: Negative) Ketone: negative   (Normal Range: Negative) Spec. Gravity: >=1.030   (Normal Range:  1.003-1.035) Blood: negative   (Normal Range: Negative) pH: 5.5   (Normal Range: 5.0-8.0) Protein: >=300   (Normal Range: Negative) Urobilinogen: 0.2   (Normal Range: 0-1) Nitrite: negative   (Normal Range: Negative) Leukocyte Esterace: small   (Normal Range: Negative)    Date/Time Received: July 07, 2009 1:32 PM  Date/Time Reported: July 07, 2009 1:32 PM   [employer] Source: vaginal WBC/hpf: 10-20 Bacteria/hpf: 1+ Clue cells/hpf: none  Negative whiff Yeast/hpf: none Wet [place] KOH: Negative Trichomonas/hpf: none  Other Tests  Rapid HIV: negative   Laboratory Results   Urine Tests    Routine Urinalysis   Glucose: negative   (Normal Range: Negative) Bilirubin: negative   (Normal Range: Negative) Ketone: negative   (Normal Range: Negative) Spec.  Gravity: >=1.030   (Normal Range: 1.003-1.035) Blood: negative   (Normal Range: Negative) pH: 5.5   (Normal Range: 5.0-8.0) Protein: >=300   (Normal Range: Negative) Urobilinogen: 0.2   (Normal Range: 0-1) Nitrite: negative   (Normal Range: Negative) Leukocyte Esterace: small   (Normal Range: Negative)      Wet [place]/KOH  Negative whiff  Other Tests  Rapid HIV: negative

## 2010-02-23 NOTE — Letter (Signed)
Summary: *HSN Results Follow up  [facility]  [address] [place], [place] [address]   Phone: [phone]  Fax: [phone]      07/14/2009   [facility] [address] [place], [place]  [address]   Dear  Ms. [employer],                            ____S.Drinkard,FNP   ____D. Gore,FNP       ____B. McPherson,MD   ____V. Rankins,MD    __X__E. Maxson Oddo,MD    ____N. Daphine Deutscher, FNP  ____D. Reche Dixon, MD    ____K. Philipp Deputy, MD    ____Other     This letter is to inform you that your recent test(s):  ___X____Pap Smear    ____X___Lab Test     ___X____X-ray    _______ is within acceptable limits  _______ requires a medication change  _______ requires a follow-up lab visit  _______ requires a follow-up visit with your provider   Comments:  Lab tests regarding your joint pains do not support inflammation like would see in rheumatoid arthritis--you have regular arthritis.  Pap smear was fine.  Your ultrasound did show a large fibroid, which has made your uterus larger.  No concern with that.       _________________________________________________________ If you have any questions, please contact our office                     Sincerely,  Julieanne Manson MD [facility]

## 2010-02-23 NOTE — Assessment & Plan Note (Signed)
Summary: 1 MONTH FU ON BP/BMET//KT   Vital Signs:  Patient profile:   60 year old female Menstrual status:  postmenopausal Weight:      189.2 pounds Pulse rate:   72 / minute Pulse rhythm:   regular Resp:     24 per minute BP sitting:   178 / 100  (left arm) Cuff size:   regular  Vitals Entered By: Dutch Quint RN (November 17, 2009 9:54 AM) CC: BP and BMET Is Patient Diabetic? No Pain Assessment Patient in pain? no       Does patient need assistance? Functional Status Self care Ambulation Normal   CC:  BP and BMET.  History of Present Illness: Here for BP check.  Took meds this morning, about an hour before visit.  Medications checked against bottles, states she's taking regularly.  Some question on whether or not this is actually the case.  States asymptomatic.  Allergies: 1)  ! Codeine  Review of Systems CV:  Denies headache, cough, visual changes.  States asymptomatic.   Impression & Recommendations:  Problem # 1:  HYPERTENSION (ICD-401.9)  Will check meds against refill history BP elevated  178/100 BMET done Return in one week forBP recheck with triage nurse  Her updated medication list for this problem includes:    Clonidine Hcl 0.1 Mg Tabs (Clonidine hcl) .Marland Kitchen... 1 tab by mouth in morning and 2 tabs by mouth at night.    Lisinopril 40 Mg Tabs (Lisinopril) .Marland Kitchen... 1 tab by mouth daily    Amlodipine Besylate 10 Mg Tabs (Amlodipine besylate) .Marland Kitchen... 1 tab by mouth daily    Atenolol 50 Mg Tabs (Atenolol) .Marland Kitchen... 1 tab daily by mouth  Orders: T-Basic Metabolic Panel [phone])  Complete Medication List: 1)  Clonazepam 1 Mg Tabs (Clonazepam) .Marland Kitchen.. 1 tab by mouth at hs  dr. Katrinka Blazing 2)  Clonidine Hcl 0.1 Mg Tabs (Clonidine hcl) .Marland Kitchen.. 1 tab by mouth in morning and 2 tabs by mouth at night. 3)  Lisinopril 40 Mg Tabs (Lisinopril) .Marland Kitchen.. 1 tab by mouth daily 4)  Amlodipine Besylate 10 Mg Tabs (Amlodipine besylate) .Marland Kitchen.. 1 tab by mouth daily 5)  Atenolol 50 Mg Tabs  (Atenolol) .Marland Kitchen.. 1 tab daily by mouth 6)  Seroquel Xr 150 Mg Xr24h-tab (Quetiapine fumarate) .Marland Kitchen.. 1 tab by mouth at 7 pm dr. Katrinka Blazing 7)  Sertraline Hcl 50 Mg Tabs (Sertraline hcl) .Marland Kitchen.. 1 tab by mouth q hs dr. Katrinka Blazing  Other Orders: Flu Vaccine 32yrs + [phone]) Admin 1st Vaccine ([id number])  Patient Instructions: 1)  Reviewed with Dr. Delrae Alfred 2)  Your blood pressure is elevated 178/100 3)  Return in one week for recheck of blood pressure with triage nurse 4)  We will call with the results of your lab work. 5)  You received your flu vaccine today 6)  Call if anything changes or if you have any questions 7)  If BP is still higher than 150/90 when follows up, increase Clonidine to 0.2 mg two times a day with repeat bp in 1 week.  Baruch Goldmann, M.D.   Orders Added: 1)  Est. Patient Level I [29562] 2)  T-Basic Metabolic Panel [[id number]] 3)  Flu Vaccine 43yrs + [90658] 4)  Admin 1st Vaccine [96295]   Immunizations Administered:  Influenza Vaccine # 1:    Vaccine Type: Fluvax 3+    Site: left deltoid    Mfr: GlaxoSmithKline    Dose: 0.5 ml    Route: IM    Given by: Dutch Quint RN  Exp. Date: 07/24/2010    Lot #: [id number]    VIS given: 08/18/09 version given November 17, 2009.  Flu Vaccine Consent Questions:    Do you have a history of severe allergic reactions to this vaccine? no    Any prior history of allergic reactions to egg and/or gelatin? no    Do you have a sensitivity to the preservative Thimersol? no    Do you have a past history of Guillan-Barre Syndrome? no    Do you currently have an acute febrile illness? no    Have you ever had a severe reaction to latex? no    Vaccine information given and explained to patient? yes    Are you currently pregnant? no   Immunizations Administered:  Influenza Vaccine # 1:    Vaccine Type: Fluvax 3+    Site: left deltoid    Mfr: GlaxoSmithKline    Dose: 0.5 ml    Route: IM    Given by: Dutch Quint RN    Exp. Date: 07/24/2010     Lot #: [id number]    VIS given: 08/18/09 version given November 17, 2009.   Appended Document: 1 MONTH FU ON BP/BMET//KT EValuated pharmacy records--she appears to at least be picking up regularly--increase Clonidine to 0.2 mg two times a day at next visit if bp still > 150/90

## 2010-02-23 NOTE — Assessment & Plan Note (Signed)
Summary: f/u htn/ knee pain no ov in >1  year /tmm   Vital Signs:  Patient profile:   60 year old female Menstrual status:  postmenopausal Weight:      188.6 pounds BMI:     35.76 Temp:     98.1 degrees F Pulse rate:   72 / minute Pulse rhythm:   regular Resp:     20 per minute BP sitting:   172 / 99  (left arm) Cuff size:   regular  Vitals Entered By: Vesta Mixer CMA (May 05, 2009 9:02 AM) CC: no ov in a while.  Has not taken bp meds today.  Knees hurt specially when going up staires.  Left worse than right also gets short of breath real easy.  Per pt not taking seroquel now. Is Patient Diabetic? No Pain Assessment Patient in pain? no       Does patient need assistance? Ambulation Normal   CC:  no ov in a while.  Has not taken bp meds today.  Knees hurt specially when going up staires.  Left worse than right also gets short of breath real easy.  Per pt not taking seroquel now..  History of Present Illness: Pt. not seen in over 1 year.  Has been dealing with mental health issues.  Lot of loss of family and friends since last year.  Is going to [facility].    1.  Hypertension:  States she does miss meds--if cannot recall if has taken, does not take.  She does have a pill box, but hasn/t really used.  Daughter has tried working with her on this.  Not exercising.  Has put on 13 lbs since last year.    2.  Left knee pain:  Front part of knee and top of foot hurts as well. Past 5 months or so.  When goes up stairs, really hurts.  Her steps are quite high in her home--no problem with shorter steps, however.  Does have pain even when not moving.  Hurts also with kneeling or deep knee bending.  Sits a lot, stays in her home with the baby.  Is trying to move by end of May--will still have Section 8 housing.  Not sure if area she is moving to is safer than current area.    3.  Worried about DM.  Siblings with DM.  Son borderline.  Weight gain as above  Current Medications  (verified): 1)  Clonazepam 1 Mg Tabs (Clonazepam) .Marland Kitchen.. 1 Tab By Mouth At [facility]  Dr. Katrinka Blazing 2)  Clonidine Hcl 0.1 Mg Tabs (Clonidine Hcl) .... Take One Tab Every Night At Bedtime 3)  Lisinopril 20 Mg Tabs (Lisinopril) .... Take One Tab Once Daily 4)  Nifediac Cc 90 Mg Xr24h-Tab (Nifedipine) .... Take One Tab Once Daily 5)  Atenolol 50 Mg Tabs (Atenolol) .Marland Kitchen.. 1 Tab Daily By Mouth 6)  Zoloft 50 Mg Tabs (Sertraline Hcl) .Marland Kitchen.. 1 Tab By Mouth Daily 7)  Seroquel Xr 150 Mg Xr24h-Tab (Quetiapine Fumarate) .Marland Kitchen.. 1 Tab By Mouth At 7 Pm Dr. Katrinka Blazing 8)  Sertraline Hcl 50 Mg Tabs (Sertraline Hcl) .Marland Kitchen.. 1 Tab By Mouth Q Hs Dr. Katrinka Blazing  Allergies (verified): 1)  ! Codeine  Social History: Divorced Lives alone--but now caring for son's child--child now in custody of pt's daughter.  Baby's mother a drug addict.   Works part time helping out in a daycare--volunteers.   Worked in [employer] previously. No partner. Not sexually active Quit smoking many  years ago.  Cannot say how many years she smoked. No exercise  Physical Exam  General:  Morbidly obese. Lungs:  Normal respiratory effort, chest expands symmetrically. Lungs are clear to auscultation, no crackles or wheezes. Heart:  Normal rate and regular rhythm. S1 and S2 normal without gallop, murmur, click, rub or other extra sounds.  Radial pulses normal and equal. Extremities:  Decreased flexion of left knee.   Otherwise full ROM.  Good extension against opposition No definite effusion--lot of fatty tissue surrounding. NT over patella and along joint margins, posteriorly NT with no laxity  on stress of ligaments  Mild tenderness over left dorsal foot--no erythema, swelling, or crepitation.   Impression & Recommendations:  Problem # 1:  KNEE PAIN, LEFT (GNF-621.30)  Orders: Diagnostic X-Ray/Fluoroscopy (Diagnostic X-Ray/Flu) UA Dipstick w/o Micro (automated)  (81003)  Problem # 2:  WEIGHT GAIN (ICD-783.1)  Orders: T-Lipid Profile  (86578-46962)  Problem # 3:  HYPERTENSION (ICD-401.9) Encouraged exercise, weight loss and to take meds regularly--she will work with daughter and pill box on this. Her updated medication list for this problem includes:    Clonidine Hcl 0.1 Mg Tabs (Clonidine hcl) .Marland Kitchen... Take one tab every night at bedtime    Lisinopril 20 Mg Tabs (Lisinopril) .Marland Kitchen... Take one tab once daily    Nifediac Cc 90 Mg Xr24h-tab (Nifedipine) .Marland Kitchen... Take one tab once daily    Atenolol 50 Mg Tabs (Atenolol) .Marland Kitchen... 1 tab daily by mouth  Orders: T-Comprehensive Metabolic Panel (95284-13244) T-CBC w/Diff (01027-25366) UA Dipstick w/o Micro (automated)  (81003)  Complete Medication List: 1)  Clonazepam 1 Mg Tabs (Clonazepam) .Marland Kitchen.. 1 tab by mouth at hs  dr. Katrinka Blazing 2)  Clonidine Hcl 0.1 Mg Tabs (Clonidine hcl) .... Take one tab every night at bedtime 3)  Lisinopril 20 Mg Tabs (Lisinopril) .... Take one tab once daily 4)  Nifediac Cc 90 Mg Xr24h-tab (Nifedipine) .... Take one tab once daily 5)  Atenolol 50 Mg Tabs (Atenolol) .Marland Kitchen.. 1 tab daily by mouth 6)  Zoloft 50 Mg Tabs (Sertraline hcl) .Marland Kitchen.. 1 tab by mouth daily 7)  Seroquel Xr 150 Mg Xr24h-tab (Quetiapine fumarate) .Marland Kitchen.. 1 tab by mouth at 7 pm dr. Katrinka Blazing 8)  Sertraline Hcl 50 Mg Tabs (Sertraline hcl) .Marland Kitchen.. 1 tab by mouth q hs dr. Katrinka Blazing  Patient Instructions: 1)  Use your pill box--get a set schedule for taking meds. 2)  Exercise--start low and gradually increase. 3)  Follow up with Dr. Delrae Alfred in 2 months --CPP and bp follow up. 4)  Tylenol 1000 mg two times a day for knee pain--take regularly  Appended Document: f/u htn/ knee pain no ov in >1  year /tmm  Laboratory Results   Urine Tests    Routine Urinalysis   Glucose: negative   (Normal Range: Negative) Bilirubin: negative   (Normal Range: Negative) Ketone: negative   (Normal Range: Negative) Spec. Gravity: >=1.030   (Normal Range: 1.003-1.035) Blood: negative   (Normal Range: Negative) pH: 5.5   (Normal  Range: 5.0-8.0) Protein: >=300   (Normal Range: Negative) Urobilinogen: 0.2   (Normal Range: 0-1) Nitrite: negative   (Normal Range: Negative) Leukocyte Esterace: negative   (Normal Range: Negative)      Please have pt. come in well hydrated with water to repeat UA secondary to proteinuria.  If still with protein in urine, set her up for a 24 hour urine collection for protein and creatinine clearance.  Pt notified and she will come in this week to give UA  after hydrating well........ Elmarie Shiley McCoy CMA  May 11, 2009 11:06 AM

## 2010-02-23 NOTE — Progress Notes (Signed)
Summary: Office Visit//DEPRESSION SCREENNING  Office Visit//DEPRESSION SCREENNING   Imported By: Arta Bruce 07/08/2009 09:55:25  _____________________________________________________________________  External Attachment:    Type:   Image     Comment:   External Document

## 2010-02-23 NOTE — Assessment & Plan Note (Signed)
Summary: F/U per Dr. Baran Kuhrt//mm   Vital Signs:  Patient profile:   60 year old female Menstrual status:  postmenopausal Height:      61 inches Weight:      190.7 pounds Pulse rate:   76 / minute BP sitting:   160 / 94  (left arm) Cuff size:   large  Vitals Entered By: Michelle Nasuti (October 16, 2009 11:05 AM) CC: htn flollow up Pain Assessment Patient in pain? no        CC:  htn flollow up.  History of Present Illness: 1.  Hypertension:  No Clonidine bottle today, but states she is taking  tab in morning and 2 at night.  Swelling of legs is better.  2.  Pain in right maxillary area--went to dentist secondary to pain and swelling in that area 4 days ago.  Sounds like she was diagnosed with an abscessed tooth.  Has been on Amoxicillin 500 mg three times a day for 3 days.  Swelling is better, but still with some pain.  No drainage.  She does have a follow up with him.    3.  Bipolar Disorder:  pt. has been to [employer] having difficulties after losing sister in July.  On Clonazepam and Sertraline currenlty-not clear if taking the Seroquel.  Medications helps somewhat.  Allergies: 1)  ! Codeine  Physical Exam  Mouth:  No current swelling of cheek or gingiva.  No erythema.  Mild tenderness near right nasaolabial fold.  Remaining teeth with very large cavities. Lungs:  Normal respiratory effort, chest expands symmetrically. Lungs are clear to auscultation, no crackles or wheezes. Heart:  Normal rate and regular rhythm. S1 and S2 normal without gallop, murmur, click, rub or other extra sounds.  Radial pulses normal and equal   Impression & Recommendations:  Problem # 1:  HYPERTENSION (ICD-401.9) Not adequately controlled--increase Lisinopril Her updated medication list for this problem includes:    Clonidine Hcl 0.1 Mg Tabs (Clonidine hcl) .Marland Kitchen... 1 tab by mouth in morning and 2 tabs by mouth at night.    Lisinopril 40 Mg Tabs (Lisinopril) .Marland Kitchen... 1 tab by mouth  daily    Amlodipine Besylate 10 Mg Tabs (Amlodipine besylate) .Marland Kitchen... 1 tab by mouth daily    Atenolol 50 Mg Tabs (Atenolol) .Marland Kitchen... 1 tab daily by mouth  Problem # 2:  DENTAL PAIN (ICD-525.9) No concerning findings for infection today--to follow up with dentist.  Problem # 3:  BIPOLAR DISORDER UNSPECIFIED (ICD-296.80) As per [facility]  Complete Medication List: 1)  Clonazepam 1 Mg Tabs (Clonazepam) .Marland Kitchen.. 1 tab by mouth at hs  dr. Katrinka Blazing 2)  Clonidine Hcl 0.1 Mg Tabs (Clonidine hcl) .Marland Kitchen.. 1 tab by mouth in morning and 2 tabs by mouth at night. 3)  Lisinopril 40 Mg Tabs (Lisinopril) .Marland Kitchen.. 1 tab by mouth daily 4)  Amlodipine Besylate 10 Mg Tabs (Amlodipine besylate) .Marland Kitchen.. 1 tab by mouth daily 5)  Atenolol 50 Mg Tabs (Atenolol) .Marland Kitchen.. 1 tab daily by mouth 6)  Seroquel Xr 150 Mg Xr24h-tab (Quetiapine fumarate) .Marland Kitchen.. 1 tab by mouth at 7 pm dr. Katrinka Blazing 7)  Sertraline Hcl 50 Mg Tabs (Sertraline hcl) .Marland Kitchen.. 1 tab by mouth q hs dr. Katrinka Blazing  Patient Instructions: 1)  Follow up with Dr. Delrae Alfred in 1 month --for bp and to have bmet done Prescriptions: LISINOPRIL 40 MG TABS (LISINOPRIL) 1 tab by mouth daily  #30 x 11   Entered and Authorized by:   Julieanne Manson MD  Signed by:   Julieanne Manson MD on 10/16/2009   Method used:   Electronically to        [facility] [id number] (retail)       [address] [place], [place]  [address]       Ph: [phone]       Fax: [phone]   RxID:   [id number]

## 2010-02-23 NOTE — Letter (Signed)
Summary: Lipid Letter  [facility]  [address] [place], [place] [address]   Phone: [phone]  Fax: [phone]    05/08/2009  Caitlin Morris [address] [place], [place]  [address]  Dear Ms. Caitlin Morris:  We have carefully reviewed your last lipid profile from 05/05/2009 and the results are noted below with a summary of recommendations for lipid management.    Cholesterol:       205     Goal: <200   HDL "good" Cholesterol:   43     Goal: >45   LDL "bad" Cholesterol:   142     Goal: <100   Triglycerides:       98     Goal: <150    Your cholesterol is up a fair amount.  Need to work on eating healthier--lots of vegetables and fruits, cut back on fatty, fried foods and red meats.  Recommend we recheck your cholesterol again in 3-4 months.  I believe you have a follow up about then--plan to come fasting if the appt. is in the morning.  If not, we can schedule the fasting labs for a later date after your appt. with me.    TLC Diet (Therapeutic Lifestyle Change): Saturated Fats & Transfatty acids should be kept < 7% of total calories ***Reduce Saturated Fats Polyunstaurated Fat can be up to 10% of total calories Monounsaturated Fat Fat can be up to 20% of total calories Total Fat should be no greater than 25-35% of total calories Carbohydrates should be 50-60% of total calories Protein should be approximately 15% of total calories Fiber should be at least 20-30 grams a day ***Increased fiber may help lower LDL Total Cholesterol should be < 200mg /day Consider adding plant stanol/sterols to diet (example: Benacol spread) ***A higher intake of unsaturated fat may reduce Triglycerides and Increase HDL    Adjunctive Measures (may lower LIPIDS and reduce risk of Heart Attack) include: Aerobic Exercise (20-30 minutes 3-4 times a week) Limit Alcohol Consumption Weight Reduction Aspirin 75-81 mg a day by mouth (if not allergic or contraindicated) Dietary Fiber 20-30 grams a  day by mouth     Current Medications: 1)    Clonazepam 1 Mg Tabs (Clonazepam) .Marland Kitchen.. 1 tab by mouth at hs  dr. Katrinka Blazing 2)    Clonidine Hcl 0.1 Mg Tabs (Clonidine hcl) .... Take one tab every night at bedtime 3)    Lisinopril 20 Mg Tabs (Lisinopril) .... Take one tab once daily 4)    Nifediac Cc 90 Mg Xr24h-tab (Nifedipine) .... Take one tab once daily 5)    Atenolol 50 Mg Tabs (Atenolol) .Marland Kitchen.. 1 tab daily by mouth 6)    Zoloft 50 Mg Tabs (Sertraline hcl) .Marland Kitchen.. 1 tab by mouth daily 7)    Seroquel Xr 150 Mg Xr24h-tab (Quetiapine fumarate) .Marland Kitchen.. 1 tab by mouth at 7 pm dr. Katrinka Blazing 8)    Sertraline Hcl 50 Mg Tabs (Sertraline hcl) .Marland Kitchen.. 1 tab by mouth q hs dr. Katrinka Blazing  If you have any questions, please call. We appreciate being able to work with you.   Sincerely,    [facility] Julieanne Manson MD

## 2010-02-23 NOTE — Progress Notes (Signed)
  Phone Note Outgoing Call   Summary of Call: Not seen in a year. Needs f/u. Will fill HTN med x 2 mos. Initial call taken by: Brynda Rim,  April 08, 2009 4:26 PM  Follow-up for Phone Call        Appt made for pt on 05/05/09. Follow-up by: Vesta Mixer CMA,  April 08, 2009 4:45 PM    Prescriptions: LISINOPRIL 20 MG TABS (LISINOPRIL) take one tab once daily  #30 x 1   Entered and Authorized by:   Tereso Newcomer PA-C   Signed by:   Tereso Newcomer PA-C on 04/08/2009   Method used:   Electronically to        [facility] [id number] (retail)       [address] [place], [place]  [address]       Ph: [phone]       Fax: [phone]   RxID:   [id number]

## 2010-02-23 NOTE — Letter (Signed)
Summary: *HSN Results Follow up  [facility]  [address] [place], [place] [address]   Phone: [phone]  Fax: [phone]      06/21/2009   [facility] [address] [place], [place]  [address]   Dear  Ms. [employer],                            ____S.Drinkard,FNP   ____D. Gore,FNP       ____B. McPherson,MD   ____V. Rankins,MD    _X___E. Gottfried Standish,MD    ____N. Daphine Deutscher, FNP  ____D. Reche Dixon, MD    ____K. Philipp Deputy, MD    ____Other     This letter is to inform you that your recent test(s):  _______Pap Smear    ____X___Lab Test     _______X-ray    _______ is within acceptable limits  _______ requires a medication change  _______ requires a follow-up lab visit  ___X__ requires a follow-up visit with your provider   Comments:  Please make sure you follow up with me for your physical--should be in June or July.  If you do not have a follow up scheduled, please call and get an appt. with me in next 2 months       _________________________________________________________ If you have any questions, please contact our office                     Sincerely,  Julieanne Manson MD [facility]

## 2010-02-25 ENCOUNTER — Encounter: Payer: Self-pay | Admitting: Internal Medicine

## 2010-02-25 NOTE — Assessment & Plan Note (Signed)
Summary: BP recheck // tl  Nurse Visit   Vital Signs:  Patient profile:   60 year old female Menstrual status:  postmenopausal Weight:      189.5 pounds Temp:     97.0 degrees F oral Pulse rate:   60 / minute Pulse rhythm:   regular Resp:     20 per minute BP sitting:   170 / 90  (right arm) Cuff size:   regular  Vitals Entered By: Dutch Quint RN (January 21, 2010 11:26 AM)  Patient Instructions: 1)  Reviewed with Dr. Delrae Alfred 2)  Your blood pressure is still elevated 3)  Make sure your medications are taken exactly as ordered -- when you don't, your blood pressure will continue to stay high. 4)  Take Coric[place] HBP for your sinus congestion and cough -- you can get this over the counter. 5)  Return in one month for blood pressure check with triage nurse.  Bring all of your medication bottles with you. 6)  Call if anything changes or if you have any questions.  CC: BP recheck Is Patient Diabetic? No Pain Assessment Patient in pain? no       Does patient need assistance? Functional Status Self care Ambulation Normal   CC:  BP recheck.  History of Present Illness: 11/27/09 bp 156/92, pulse 56.  Clonidine changed to 0.1 mg. 1 in am and 2 in pm.  Here for BP recheck.  C/o sinus pressure, draining yellow, started Monday.  States did not take Clonidine last night, fell asleep.  Meds checked with pharmacy -- is not taking meds as ordered, has several bottles with more meds than should be for the dates.   Review of Systems ENT:  Complains of nasal congestion and sinus pressure; states nasal drainage is yellow. CV:  States occasional cough, mostly at night, dry cough, no headaches except sinus, denies peripheral edema, visual changes..   Physical Exam  Ears:  R ear normal and L ear normal.   Nose:  nares mucosa swollen, reddened Mouth:  pharynx pink and moist.     Impression & Recommendations:  Problem # 1:  HYPERTENSION (ICD-401.9) BP still elevated Pharmacy  called, meds checked -- medications not taken as ordered, especially clonidine Return one month for BP recheck with triage nurse -- to bring all med bottles with her  Her updated medication list for this problem includes:    Lisinopril 40 Mg Tabs (Lisinopril) .Marland Kitchen... 1 tab by mouth daily    Amlodipine Besylate 10 Mg Tabs (Amlodipine besylate) .Marland Kitchen... 1 tab by mouth daily    Atenolol 50 Mg Tabs (Atenolol) .Marland Kitchen... 1 tab daily by mouth    Clonidine Hcl 0.1 Mg Tabs (Clonidine hcl) .Marland Kitchen... Take one tablet by mouth in the morning and two at nght for blood pressure  Orders: Est. Patient Level I ([address])  Complete Medication List: 1)  Clonazepam 1 Mg Tabs (Clonazepam) .Marland Kitchen.. 1 tab by mouth at hs  dr. Katrinka Blazing 2)  Lisinopril 40 Mg Tabs (Lisinopril) .Marland Kitchen.. 1 tab by mouth daily 3)  Amlodipine Besylate 10 Mg Tabs (Amlodipine besylate) .Marland Kitchen.. 1 tab by mouth daily 4)  Atenolol 50 Mg Tabs (Atenolol) .Marland Kitchen.. 1 tab daily by mouth 5)  Seroquel Xr 150 Mg Xr24h-tab (Quetiapine fumarate) .Marland Kitchen.. 1 tab by mouth at 7 pm dr. Katrinka Blazing 6)  Sertraline Hcl 50 Mg Tabs (Sertraline hcl) .Marland Kitchen.. 1 tab by mouth q hs dr. Katrinka Blazing 7)  Clonidine Hcl 0.1 Mg Tabs (Clonidine hcl) .... Take one tablet by  mouth in the morning and two at nght for blood pressure   Allergies: 1)  ! Codeine  Orders Added: 1)  Est. Patient Level I [[address]]

## 2010-03-03 NOTE — Assessment & Plan Note (Signed)
Summary: BP recheck  Nurse Visit   Vital Signs:  Patient profile:   60 year old female Menstrual status:  postmenopausal Weight:      193.3 pounds BMI:     36.66 Temp:     97.9 degrees F oral Pulse rate:   52 / minute Pulse rhythm:   regular Resp:     20 per minute BP sitting:   164 / 84  (left arm) Cuff size:   regular  Vitals Entered By: Dutch Quint RN (February 25, 2010 11:21 AM) CC: BP recheck Is Patient Diabetic? No Pain Assessment Patient in pain? yes     Location: hands Intensity: 6 Type: aching Onset of pain  says it's arthritis pain  Does patient need assistance? Functional Status Self care Ambulation Normal   CC:  BP recheck.  History of Present Illness: 01/21/10 BP 170/90 P60.  No change in meds at that time.  Still drinking a lot of sodas.  Med bottles confirmed against med list - appears that pt. is compliant with medication regimen.   Patient Instructions: 1)  Reviewed with Jesse Fall 2)  Your blood pressure is better, but still elevated 3)  Watch your intake of salt, monitor for "hidden" salt in canned goods, sodas, processed meats. 4)  Take clonidine 0.2 mg. 1 tablet by mouth twice a day.  You can take  2 tablets twice a day of the 0.1 mg. prescription you have now until it runs out.  5)  Continue taking all of your other medications as ordered. 6)  Return in two weeks for a blood pressure recheck with triage nurse. 7)  Call if anything changes or if you have any questions.   Review of Systems CV:  Gets occasional SOB with walking.  Denies CP, cough, headache, visual changes, peripheral edema..   Physical Exam  General:  alert, well-developed, well-nourished, and well-hydrated.     Impression & Recommendations:  Problem # 1:  HYPERTENSION (ICD-401.9) BP better, still elevated Increase clonidine to 0.2 two times a day Monitor salt intake, watch for hidden sources F/U BP recheck in two weeks with triage  Her updated medication list for  this problem includes:    Lisinopril 40 Mg Tabs (Lisinopril) .Marland Kitchen... 1 tab by mouth daily    Amlodipine Besylate 10 Mg Tabs (Amlodipine besylate) .Marland Kitchen... 1 tab by mouth daily    Atenolol 50 Mg Tabs (Atenolol) .Marland Kitchen... 1 tab daily by mouth    Clonidine Hcl 0.2 Mg Tabs (Clonidine hcl) .Marland Kitchen... 1 tablet by mouth two times a day for blood pressure  Complete Medication List: 1)  Clonazepam 1 Mg Tabs (Clonazepam) .Marland Kitchen.. 1 tab by mouth at hs  dr. Katrinka Blazing 2)  Lisinopril 40 Mg Tabs (Lisinopril) .Marland Kitchen.. 1 tab by mouth daily 3)  Amlodipine Besylate 10 Mg Tabs (Amlodipine besylate) .Marland Kitchen.. 1 tab by mouth daily 4)  Atenolol 50 Mg Tabs (Atenolol) .Marland Kitchen.. 1 tab daily by mouth 5)  Seroquel Xr 150 Mg Xr24h-tab (Quetiapine fumarate) .Marland Kitchen.. 1 tab by mouth at 7 pm dr. Katrinka Blazing 6)  Sertraline Hcl 50 Mg Tabs (Sertraline hcl) .Marland Kitchen.. 1 tab by mouth q hs dr. Katrinka Blazing 7)  Clonidine Hcl 0.2 Mg Tabs (Clonidine hcl) .Marland Kitchen.. 1 tablet by mouth two times a day for blood pressure   Allergies: 1)  ! Codeine  Orders Added: 1)  Est. Patient Level II [97026] Prescriptions: CLONIDINE HCL 0.2 MG TABS (CLONIDINE HCL) 1 tablet by mouth two times a day for blood pressure  #60  x 5   Entered by:   Dutch Quint RN   Authorized by:   Lehman Prom FNP   Signed by:   Dutch Quint RN on 02/25/2010   Method used:   Electronically to        [facility] [id number] (retail)       [address] [place], [place]  [address]       Ph: [phone]       Fax: [phone]   RxID:   [id number]

## 2010-09-05 ENCOUNTER — Emergency Department (HOSPITAL_COMMUNITY)
Admission: EM | Admit: 2010-09-05 | Discharge: 2010-09-05 | Disposition: A | Discharge status: Home / Self Care | Payer: Medicaid Other | Attending: Emergency Medicine | Admitting: Emergency Medicine

## 2010-09-05 DIAGNOSIS — F411 Generalized anxiety disorder: Secondary | ICD-10-CM | POA: Insufficient documentation

## 2010-09-05 DIAGNOSIS — Z8673 Personal history of transient ischemic attack (TIA), and cerebral infarction without residual deficits: Secondary | ICD-10-CM | POA: Insufficient documentation

## 2010-09-05 DIAGNOSIS — E876 Hypokalemia: Secondary | ICD-10-CM | POA: Insufficient documentation

## 2010-09-05 DIAGNOSIS — I10 Essential (primary) hypertension: Secondary | ICD-10-CM | POA: Insufficient documentation

## 2010-09-05 LAB — COMPREHENSIVE METABOLIC PANEL
BUN: 12 mg/dL (ref 6–23)
CO2: 30 mEq/L (ref 19–32)
Calcium: 11 mg/dL — ABNORMAL HIGH (ref 8.4–10.5)
Chloride: 99 mEq/L (ref 96–112)
Creatinine, Ser: 0.95 mg/dL (ref 0.50–1.10)
GFR calc Af Amer: >60 mL/min (ref >60)
GFR calc non Af Amer: 60 mL/min — ABNORMAL LOW (ref >60)
Glucose, Bld: 95 mg/dL (ref 70–99)
Total Bilirubin: 0.5 mg/dL (ref 0.3–1.2)

## 2010-09-05 LAB — DIFFERENTIAL
Eosinophils Absolute: 0.2 10*3/uL (ref 0.0–0.7)
Eosinophils Relative: 4 % (ref 0–5)
Lymphocytes Relative: 25 % (ref 12–46)
Lymphs Abs: 1.5 10*3/uL (ref 0.7–4.0)
Monocytes Relative: 9 % (ref 3–12)

## 2010-09-05 LAB — RAPID URINE DRUG SCREEN, HOSP PERFORMED
Amphetamines: NOT DETECTED
Benzodiazepines: NOT DETECTED
Opiates: NOT DETECTED
Tetrahydrocannabinol: NOT DETECTED

## 2010-09-05 LAB — URINALYSIS, ROUTINE W REFLEX MICROSCOPIC
Bilirubin Urine: NEGATIVE
Hgb urine dipstick: NEGATIVE
Ketones, ur: NEGATIVE mg/dL
Specific Gravity, Urine: 1.005 (ref 1.005–1.030)
Urobilinogen, UA: 0.2 mg/dL (ref 0.0–1.0)
pH: 7 (ref 5.0–8.0)

## 2010-09-05 LAB — CBC
HCT: 40 % (ref 36.0–46.0)
MCH: 27.5 pg (ref 26.0–34.0)
MCV: 83.3 fL (ref 78.0–100.0)
RDW: 14.5 % (ref 11.5–15.5)
WBC: 5.8 10*3/uL (ref 4.0–10.5)

## 2010-09-05 LAB — URINE MICROSCOPIC-ADD ON

## 2010-10-12 ENCOUNTER — Other Ambulatory Visit (HOSPITAL_COMMUNITY): Payer: Self-pay | Admitting: Family Medicine

## 2010-10-12 DIAGNOSIS — Z1231 Encounter for screening mammogram for malignant neoplasm of breast: Secondary | ICD-10-CM

## 2010-11-16 ENCOUNTER — Ambulatory Visit (HOSPITAL_COMMUNITY)
Admission: RE | Admit: 2010-11-16 | Discharge: 2010-11-16 | Disposition: A | Discharge status: Home / Self Care | Payer: Medicaid Other | Source: Ambulatory Visit | Attending: Family Medicine | Admitting: Family Medicine

## 2010-11-16 DIAGNOSIS — Z1231 Encounter for screening mammogram for malignant neoplasm of breast: Secondary | ICD-10-CM | POA: Insufficient documentation

## 2011-12-12 ENCOUNTER — Other Ambulatory Visit (HOSPITAL_COMMUNITY): Payer: Self-pay | Admitting: Internal Medicine

## 2011-12-12 DIAGNOSIS — Z1231 Encounter for screening mammogram for malignant neoplasm of breast: Secondary | ICD-10-CM

## 2011-12-30 ENCOUNTER — Ambulatory Visit (HOSPITAL_COMMUNITY)
Admission: RE | Admit: 2011-12-30 | Discharge: 2011-12-30 | Disposition: A | Discharge status: Home / Self Care | Payer: Medicaid Other | Source: Ambulatory Visit | Attending: Internal Medicine | Admitting: Internal Medicine

## 2011-12-30 DIAGNOSIS — Z1231 Encounter for screening mammogram for malignant neoplasm of breast: Secondary | ICD-10-CM | POA: Insufficient documentation

## 2012-01-11 ENCOUNTER — Encounter: Payer: Self-pay | Admitting: *Deleted

## 2012-01-11 ENCOUNTER — Encounter: Payer: Medicaid Other | Attending: Internal Medicine | Admitting: *Deleted

## 2012-01-11 VITALS — Ht 63.0 in | Wt 171.1 lb

## 2012-01-11 DIAGNOSIS — Z713 Dietary counseling and surveillance: Secondary | ICD-10-CM | POA: Insufficient documentation

## 2012-01-11 DIAGNOSIS — E119 Type 2 diabetes mellitus without complications: Secondary | ICD-10-CM | POA: Insufficient documentation

## 2012-01-11 NOTE — Patient Instructions (Addendum)
Plan: Aim for 2-3 Carb Choices per meal (30-45 grams) Aim for 0-1 Carb per snack if hungry Consider reading food labels for total carbohydrate of foods Consider either walking or exercises in your home for 5-15 minutes a few times every day Consider asking MD about getting a meter so you can check your BG yourself

## 2012-01-11 NOTE — Progress Notes (Signed)
  Medical Nutrition Therapy:  Appt start time: 1200 end time:  1300.  Assessment:  Primary concerns today: patient here for diabetes education. Patient here with her daughter and a grandchild. She states her son has started restricting some of her food choices now that she has been diagnosed with diabetes, including excluding her regular Pepsi and all sweets. 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 She finds this stressful due to the high energy level of the child.  MEDICATIONS: see list   DIETARY INTAKE:  Usual eating pattern includes 2-3 meals and 0-1 snacks per day.  Everyday foods include fast food, frozen dinners and other easily prepared meals.  Avoided foods include none stated other than regular sodas due to son preventing them now.    24-hr recall:  B ( AM): left overs (soup), may skip, fresh fruit OR large bowl of cereal with 2% milk, enjoys coffee but family prohibits Snk ( AM): none  L ( PM): left overs in refrigerator, OR fast food fried chicken, water or would like Pepsi Snk ( PM): none D ( PM): used to eat out, not lately, frozen dinners,  Snk ( PM): crackers before bed to take with medications Beverages: water, would enjoy soda if family allowed  Usual physical activity: cleans own house and keeps grandchild during the day  Estimated energy needs: 1400 calories 158 g carbohydrates 105 g protein 39 g fat  Progress Towards Goal(s):  In progress.   Nutritional Diagnosis:  NB-1.1 Food and nutrition-related knowledge deficit As related to new diagnosis of diabetes.  As evidenced by A1c of 7.5% on 11/03/2011.    Intervention:  Nutrition counseling and diabetes education initiated. Discussed basic physiology of diabetes, SMBG and rationale of checking BG at alternate times of day, A1c, Carb Counting and reading food labels, and benefits of increased activity.   Plan: Aim for 2-3 Carb Choices per  meal (30-45 grams) Aim for 0-1 Carb per snack if hungry Consider reading food labels for total carbohydrate of foods Consider either walking or exercises in your home for 5-15 minutes a few times every day Consider asking MD about getting a meter so you can check your BG yourself   Handouts given during visit include: Living Well with Diabetes Carb Counting and Food Label handouts Meal Plan Card  Monitoring/Evaluation:  Dietary intake, exercise, reading food labels, and body weight prn. Patient to call if she has questions or concerns. Does not choose to make an appointment at this time.

## 2012-01-16 ENCOUNTER — Encounter: Payer: Self-pay | Admitting: *Deleted

## 2012-05-22 ENCOUNTER — Other Ambulatory Visit: Payer: Self-pay | Admitting: Nephrology

## 2012-05-22 DIAGNOSIS — N189 Chronic kidney disease, unspecified: Secondary | ICD-10-CM

## 2012-05-31 ENCOUNTER — Ambulatory Visit
Admission: RE | Admit: 2012-05-31 | Discharge: 2012-05-31 | Disposition: A | Discharge status: Home / Self Care | Payer: Medicaid Other | Source: Ambulatory Visit | Attending: Nephrology | Admitting: Nephrology

## 2012-05-31 DIAGNOSIS — N189 Chronic kidney disease, unspecified: Secondary | ICD-10-CM

## 2012-12-12 ENCOUNTER — Other Ambulatory Visit (HOSPITAL_COMMUNITY): Payer: Self-pay | Admitting: Internal Medicine

## 2012-12-12 DIAGNOSIS — Z1231 Encounter for screening mammogram for malignant neoplasm of breast: Secondary | ICD-10-CM

## 2012-12-31 ENCOUNTER — Ambulatory Visit (HOSPITAL_COMMUNITY): Payer: Medicaid Other

## 2013-02-05 ENCOUNTER — Ambulatory Visit (HOSPITAL_COMMUNITY)
Admission: RE | Admit: 2013-02-05 | Discharge: 2013-02-05 | Disposition: A | Discharge status: Home / Self Care | Payer: Medicaid Other | Source: Ambulatory Visit | Attending: Internal Medicine | Admitting: Internal Medicine

## 2013-02-05 DIAGNOSIS — Z1231 Encounter for screening mammogram for malignant neoplasm of breast: Secondary | ICD-10-CM | POA: Insufficient documentation

## 2013-09-11 ENCOUNTER — Other Ambulatory Visit: Payer: Self-pay | Admitting: Orthopedic Surgery

## 2013-09-11 ENCOUNTER — Other Ambulatory Visit: Payer: Self-pay | Admitting: Internal Medicine

## 2013-09-18 ENCOUNTER — Encounter (HOSPITAL_COMMUNITY): Payer: Self-pay

## 2013-09-20 ENCOUNTER — Encounter (HOSPITAL_COMMUNITY)
Admission: RE | Admit: 2013-09-20 | Discharge: 2013-09-20 | Disposition: A | Discharge status: Home / Self Care | Payer: Medicare Other | Source: Ambulatory Visit | Attending: Orthopedic Surgery | Admitting: Orthopedic Surgery

## 2013-09-20 ENCOUNTER — Encounter (HOSPITAL_COMMUNITY): Payer: Self-pay

## 2013-09-20 DIAGNOSIS — M1710 Unilateral primary osteoarthritis, unspecified knee: Secondary | ICD-10-CM | POA: Insufficient documentation

## 2013-09-20 DIAGNOSIS — Z01818 Encounter for other preprocedural examination: Secondary | ICD-10-CM | POA: Insufficient documentation

## 2013-09-20 LAB — COMPREHENSIVE METABOLIC PANEL
ALBUMIN: 4 g/dL (ref 3.5–5.2)
ALT: 21 U/L (ref 0–35)
ANION GAP: 14 (ref 5–15)
AST: 24 U/L (ref 0–37)
Alkaline Phosphatase: 112 U/L (ref 39–117)
BILIRUBIN TOTAL: 0.6 mg/dL (ref 0.3–1.2)
BUN: 17 mg/dL (ref 6–23)
CALCIUM: 10.3 mg/dL (ref 8.4–10.5)
CHLORIDE: 100 meq/L (ref 96–112)
CO2: 27 mEq/L (ref 19–32)
Creatinine, Ser: 1.36 mg/dL — ABNORMAL HIGH (ref 0.50–1.10)
GFR calc Af Amer: 46 mL/min — ABNORMAL LOW (ref >90)
GFR, EST NON AFRICAN AMERICAN: 40 mL/min — AB (ref >90)
Glucose, Bld: 101 mg/dL — ABNORMAL HIGH (ref 70–99)
Potassium: 3.3 mEq/L — ABNORMAL LOW (ref 3.7–5.3)
Sodium: 141 mEq/L (ref 137–147)
Total Protein: 8 g/dL (ref 6.0–8.3)

## 2013-09-20 LAB — CBC WITH DIFFERENTIAL/PLATELET
Basophils Absolute: 0 10*3/uL (ref 0.0–0.1)
Basophils Relative: 0 % (ref 0–1)
Eosinophils Absolute: 0.3 10*3/uL (ref 0.0–0.7)
Eosinophils Relative: 6 % — ABNORMAL HIGH (ref 0–5)
HEMATOCRIT: 39.5 % (ref 36.0–46.0)
HEMOGLOBIN: 12.6 g/dL (ref 12.0–15.0)
Lymphocytes Relative: 32 % (ref 12–46)
Lymphs Abs: 1.8 10*3/uL (ref 0.7–4.0)
MCH: 27.7 pg (ref 26.0–34.0)
MCHC: 31.9 g/dL (ref 30.0–36.0)
MCV: 86.8 fL (ref 78.0–100.0)
MONO ABS: 0.4 10*3/uL (ref 0.1–1.0)
MONOS PCT: 7 % (ref 3–12)
NEUTROS ABS: 3 10*3/uL (ref 1.7–7.7)
Neutrophils Relative %: 55 % (ref 43–77)
Platelets: 261 10*3/uL (ref 150–400)
RBC: 4.55 MIL/uL (ref 3.87–5.11)
RDW: 15.1 % (ref 11.5–15.5)
WBC: 5.5 10*3/uL (ref 4.0–10.5)

## 2013-09-20 LAB — SURGICAL PCR SCREEN
MRSA, PCR: NEGATIVE
Staphylococcus aureus: NEGATIVE

## 2013-09-20 LAB — TYPE AND SCREEN
ABO/RH(D): O POS
Antibody Screen: NEGATIVE

## 2013-09-20 LAB — PROTIME-INR
INR: 0.99 (ref 0.00–1.49)
PROTHROMBIN TIME: 13.1 s (ref 11.6–15.2)

## 2013-09-20 LAB — ABO/RH: ABO/RH(D): O POS

## 2013-09-20 LAB — APTT: aPTT: 30 seconds (ref 24–37)

## 2013-09-20 NOTE — Pre-Procedure Instructions (Signed)
ADONIS RYTHER   09/20/2013   Your procedure is scheduled on:  10-01-2013  Tuesday   Report to [facility] Admitting at 8:30 AM.   Call this number if you have problems the morning of surgery: [phone]   Remember:   Do not eat food or drink liquids after midnight.    Take these medicines the morning of surgery with A SIP OF WATER: amlodipine(Norvasc),atenolol(Tenorimin),clonidine(Catapress),sertraline(zoloft)              DO NOT TAKE ANY DIABETIC MEDICATIONS THE MORNING OF SURGERY   Do not wear jewelry, make-up or nail polish.  Do not wear lotions, powders, or perfumes. You may NOT wear deodorant.   Do not shave 48 hours prior to surgery. Men may shave face and neck.  Do not bring valuables to the hospital.  [facility] is not responsible for any belongings or valuables.               Contacts, dentures or bridgework may not be worn into surgery.   Leave suitcase in the car. After surgery it may be brought to your room.  For patients admitted to the hospital, discharge time is determined by your   treatment team.                  Special Instructions: See Attached Sheet for instructions on CHG shower/bath   Please read over the following fact sheets that you were given: Pain Booklet, Coughing and Deep Breathing, Blood Transfusion Information and Surgical Site Infection Prevention

## 2013-09-20 NOTE — Progress Notes (Signed)
Lab called and reports that urine has leaked our of specimen cup. Will need recollection DOS

## 2013-09-30 MED ORDER — CEFAZOLIN SODIUM-DEXTROSE 2-3 GM-% IV SOLR
2.0000 g | INTRAVENOUS | Status: AC
  Administered 2013-10-01: 2 g via INTRAVENOUS
  Filled 2013-09-30: qty 50

## 2013-09-30 MED ORDER — POVIDONE-IODINE 7.5 % EX SOLN
Freq: Once | CUTANEOUS | Status: DC
Start: 2013-09-30 — End: 2013-10-01
  Filled 2013-09-30: qty 118

## 2013-10-01 ENCOUNTER — Inpatient Hospital Stay (HOSPITAL_COMMUNITY): Payer: Medicare Other | Admitting: Certified Registered Nurse Anesthetist

## 2013-10-01 ENCOUNTER — Encounter (HOSPITAL_COMMUNITY)
Admission: RE | Disposition: A | Discharge status: Home Health | Payer: Self-pay | Source: Ambulatory Visit | Attending: Orthopedic Surgery

## 2013-10-01 ENCOUNTER — Inpatient Hospital Stay (HOSPITAL_COMMUNITY)
Admission: RE | Admit: 2013-10-01 | Discharge: 2013-10-04 | DRG: 470 | Disposition: A | Discharge status: Home Health | Payer: Medicare Other | Source: Ambulatory Visit | Attending: Orthopedic Surgery | Admitting: Orthopedic Surgery

## 2013-10-01 ENCOUNTER — Encounter (HOSPITAL_COMMUNITY): Payer: Self-pay | Admitting: *Deleted

## 2013-10-01 ENCOUNTER — Encounter (HOSPITAL_COMMUNITY): Payer: Medicare Other | Admitting: Certified Registered Nurse Anesthetist

## 2013-10-01 DIAGNOSIS — E785 Hyperlipidemia, unspecified: Secondary | ICD-10-CM | POA: Diagnosis present

## 2013-10-01 DIAGNOSIS — Z6835 Body mass index (BMI) 35.0-35.9, adult: Secondary | ICD-10-CM | POA: Diagnosis not present

## 2013-10-01 DIAGNOSIS — M1710 Unilateral primary osteoarthritis, unspecified knee: Principal | ICD-10-CM | POA: Diagnosis present

## 2013-10-01 DIAGNOSIS — Z7982 Long term (current) use of aspirin: Secondary | ICD-10-CM

## 2013-10-01 DIAGNOSIS — F319 Bipolar disorder, unspecified: Secondary | ICD-10-CM | POA: Diagnosis present

## 2013-10-01 DIAGNOSIS — F411 Generalized anxiety disorder: Secondary | ICD-10-CM | POA: Diagnosis present

## 2013-10-01 DIAGNOSIS — Z79899 Other long term (current) drug therapy: Secondary | ICD-10-CM | POA: Diagnosis not present

## 2013-10-01 DIAGNOSIS — E119 Type 2 diabetes mellitus without complications: Secondary | ICD-10-CM | POA: Diagnosis present

## 2013-10-01 DIAGNOSIS — I10 Essential (primary) hypertension: Secondary | ICD-10-CM | POA: Diagnosis present

## 2013-10-01 DIAGNOSIS — M25569 Pain in unspecified knee: Secondary | ICD-10-CM | POA: Diagnosis present

## 2013-10-01 DIAGNOSIS — E669 Obesity, unspecified: Secondary | ICD-10-CM | POA: Diagnosis present

## 2013-10-01 DIAGNOSIS — M1712 Unilateral primary osteoarthritis, left knee: Secondary | ICD-10-CM | POA: Diagnosis present

## 2013-10-01 HISTORY — PX: TOTAL KNEE ARTHROPLASTY: SHX125

## 2013-10-01 LAB — URINE MICROSCOPIC-ADD ON

## 2013-10-01 LAB — GLUCOSE, CAPILLARY
GLUCOSE-CAPILLARY: 180 mg/dL — AB (ref 70–99)
GLUCOSE-CAPILLARY: 198 mg/dL — AB (ref 70–99)
Glucose-Capillary: 201 mg/dL — ABNORMAL HIGH (ref 70–99)
Glucose-Capillary: 210 mg/dL — ABNORMAL HIGH (ref 70–99)
Glucose-Capillary: 159 mg/dL — ABNORMAL HIGH (ref 70–99)

## 2013-10-01 LAB — URINALYSIS, ROUTINE W REFLEX MICROSCOPIC
BILIRUBIN URINE: NEGATIVE
Glucose, UA: 100 mg/dL — AB
HGB URINE DIPSTICK: NEGATIVE
Ketones, ur: NEGATIVE mg/dL
Leukocytes, UA: NEGATIVE
Nitrite: NEGATIVE
Protein, ur: 30 mg/dL — AB
SPECIFIC GRAVITY, URINE: 1.014 (ref 1.005–1.030)
Urobilinogen, UA: 1 mg/dL (ref 0.0–1.0)
pH: 7 (ref 5.0–8.0)

## 2013-10-01 SURGERY — ARTHROPLASTY, KNEE, TOTAL
Anesthesia: General | Laterality: Left

## 2013-10-01 MED ORDER — HYDROCHLOROTHIAZIDE 25 MG PO TABS
25.0000 mg | ORAL_TABLET | Freq: Every day | ORAL | Status: DC
  Administered 2013-10-02 – 2013-10-04 (×3): 25 mg via ORAL
  Filled 2013-10-01 (×3): qty 1

## 2013-10-01 MED ORDER — ALUM & MAG HYDROXIDE-SIMETH 200-200-20 MG/5ML PO SUSP: 30.0000 mL | ORAL | Status: DC | PRN

## 2013-10-01 MED ORDER — LACTATED RINGERS IV SOLN
INTRAVENOUS | Status: DC
  Administered 2013-10-01: 09:00:00 via INTRAVENOUS

## 2013-10-01 MED ORDER — DIPHENHYDRAMINE HCL 12.5 MG/5ML PO ELIX: 12.5000 mg | ORAL_SOLUTION | ORAL | Status: DC | PRN

## 2013-10-01 MED ORDER — PROPOFOL 10 MG/ML IV BOLUS
INTRAVENOUS | Status: AC
  Filled 2013-10-01: qty 20

## 2013-10-01 MED ORDER — GLIPIZIDE ER 5 MG PO TB24
5.0000 mg | ORAL_TABLET | Freq: Every day | ORAL | Status: DC
  Administered 2013-10-02 – 2013-10-04 (×3): 5 mg via ORAL
  Filled 2013-10-01 (×4): qty 1

## 2013-10-01 MED ORDER — METHOCARBAMOL 500 MG PO TABS: 500.0000 mg | ORAL_TABLET | Freq: Four times a day (QID) | ORAL | Status: DC | PRN

## 2013-10-01 MED ORDER — PROPOFOL 10 MG/ML IV BOLUS
INTRAVENOUS | Status: DC | PRN
  Administered 2013-10-01: 150 mg via INTRAVENOUS

## 2013-10-01 MED ORDER — PROMETHAZINE HCL 25 MG/ML IJ SOLN: 6.2500 mg | INTRAMUSCULAR | Status: DC | PRN

## 2013-10-01 MED ORDER — MORPHINE SULFATE 2 MG/ML IJ SOLN: 2.0000 mg | INTRAMUSCULAR | Status: DC | PRN

## 2013-10-01 MED ORDER — OXYCODONE HCL 5 MG PO TABS
ORAL_TABLET | ORAL | Status: AC
  Administered 2013-10-01: 10 mg via ORAL
  Filled 2013-10-01: qty 2

## 2013-10-01 MED ORDER — ROCURONIUM BROMIDE 100 MG/10ML IV SOLN
INTRAVENOUS | Status: DC | PRN
  Administered 2013-10-01: 50 mg via INTRAVENOUS

## 2013-10-01 MED ORDER — LACTATED RINGERS IV SOLN
INTRAVENOUS | Status: DC | PRN
  Administered 2013-10-01: 10:00:00 via INTRAVENOUS

## 2013-10-01 MED ORDER — FENTANYL CITRATE 0.05 MG/ML IJ SOLN
INTRAMUSCULAR | Status: AC
  Administered 2013-10-01: 25 ug via INTRAVENOUS
  Filled 2013-10-01: qty 2

## 2013-10-01 MED ORDER — LIDOCAINE HCL (CARDIAC) 20 MG/ML IV SOLN
INTRAVENOUS | Status: AC
  Filled 2013-10-01: qty 5

## 2013-10-01 MED ORDER — METOCLOPRAMIDE HCL 10 MG PO TABS: 5.0000 mg | ORAL_TABLET | Freq: Three times a day (TID) | ORAL | Status: DC | PRN

## 2013-10-01 MED ORDER — ONDANSETRON HCL 4 MG PO TABS: 4.0000 mg | ORAL_TABLET | Freq: Four times a day (QID) | ORAL | Status: DC | PRN

## 2013-10-01 MED ORDER — LISINOPRIL 20 MG PO TABS
20.0000 mg | ORAL_TABLET | Freq: Every day | ORAL | Status: DC
  Administered 2013-10-02 – 2013-10-04 (×3): 20 mg via ORAL
  Filled 2013-10-01 (×3): qty 1

## 2013-10-01 MED ORDER — SENNA 8.6 MG PO TABS
1.0000 | ORAL_TABLET | Freq: Two times a day (BID) | ORAL | Status: DC
  Administered 2013-10-01 – 2013-10-04 (×6): 8.6 mg via ORAL
  Filled 2013-10-01 (×7): qty 1

## 2013-10-01 MED ORDER — BISACODYL 10 MG RE SUPP: 10.0000 mg | Freq: Every day | RECTAL | Status: DC | PRN

## 2013-10-01 MED ORDER — ASPIRIN EC 325 MG PO TBEC
325.0000 mg | DELAYED_RELEASE_TABLET | Freq: Every day | ORAL | Status: DC
Start: 2013-10-02 — End: 2013-10-04
  Administered 2013-10-02 – 2013-10-04 (×3): 325 mg via ORAL
  Filled 2013-10-01 (×4): qty 1

## 2013-10-01 MED ORDER — METHOCARBAMOL 1000 MG/10ML IJ SOLN
500.0000 mg | Freq: Four times a day (QID) | INTRAVENOUS | Status: DC | PRN
  Filled 2013-10-01 (×2): qty 5

## 2013-10-01 MED ORDER — PHENOL 1.4 % MT LIQD: 1.0000 | OROMUCOSAL | Status: DC | PRN

## 2013-10-01 MED ORDER — BUPIVACAINE LIPOSOME 1.3 % IJ SUSP
20.0000 mL | Freq: Once | INTRAMUSCULAR | Status: DC
  Filled 2013-10-01: qty 20

## 2013-10-01 MED ORDER — MIDAZOLAM HCL 2 MG/2ML IJ SOLN
INTRAMUSCULAR | Status: AC
  Administered 2013-10-01: 0.5 mg via INTRAVENOUS
  Filled 2013-10-01: qty 2

## 2013-10-01 MED ORDER — ACETAMINOPHEN 325 MG PO TABS: 650.0000 mg | ORAL_TABLET | Freq: Four times a day (QID) | ORAL | Status: DC | PRN

## 2013-10-01 MED ORDER — MIDAZOLAM HCL 2 MG/2ML IJ SOLN
1.0000 mg | INTRAMUSCULAR | Status: DC | PRN
  Administered 2013-10-01: 0.5 mg via INTRAVENOUS

## 2013-10-01 MED ORDER — ONDANSETRON HCL 4 MG/2ML IJ SOLN
4.0000 mg | Freq: Four times a day (QID) | INTRAMUSCULAR | Status: DC | PRN
  Administered 2013-10-01: 4 mg via INTRAVENOUS
  Filled 2013-10-01: qty 2

## 2013-10-01 MED ORDER — SODIUM CHLORIDE 0.9 % IV SOLN
INTRAVENOUS | Status: DC
  Administered 2013-10-01: 100 mL/h via INTRAVENOUS
  Administered 2013-10-02: 09:00:00 via INTRAVENOUS

## 2013-10-01 MED ORDER — SODIUM CHLORIDE 0.9 % IR SOLN
Status: DC | PRN
  Administered 2013-10-01 (×2): 1000 mL

## 2013-10-01 MED ORDER — SODIUM CHLORIDE 0.9 % IV SOLN: INTRAVENOUS | Status: DC | PRN

## 2013-10-01 MED ORDER — MENTHOL 3 MG MT LOZG: 1.0000 | LOZENGE | OROMUCOSAL | Status: DC | PRN

## 2013-10-01 MED ORDER — ONDANSETRON HCL 4 MG/2ML IJ SOLN
INTRAMUSCULAR | Status: DC | PRN
  Administered 2013-10-01: 4 mg via INTRAVENOUS

## 2013-10-01 MED ORDER — POLYETHYLENE GLYCOL 3350 17 G PO PACK: 17.0000 g | PACK | Freq: Every day | ORAL | Status: DC | PRN

## 2013-10-01 MED ORDER — FLEET ENEMA 7-19 GM/118ML RE ENEM: 1.0000 | ENEMA | Freq: Once | RECTAL | Status: AC | PRN

## 2013-10-01 MED ORDER — CEFAZOLIN SODIUM-DEXTROSE 2-3 GM-% IV SOLR
2.0000 g | Freq: Four times a day (QID) | INTRAVENOUS | Status: AC
  Administered 2013-10-01 (×2): 2 g via INTRAVENOUS
  Filled 2013-10-01 (×2): qty 50

## 2013-10-01 MED ORDER — ONDANSETRON HCL 4 MG/2ML IJ SOLN
INTRAMUSCULAR | Status: AC
  Filled 2013-10-01: qty 2

## 2013-10-01 MED ORDER — LISINOPRIL-HYDROCHLOROTHIAZIDE 20-25 MG PO TABS: 1.0000 | ORAL_TABLET | Freq: Every day | ORAL | Status: DC

## 2013-10-01 MED ORDER — BUPIVACAINE-EPINEPHRINE (PF) 0.5% -1:200000 IJ SOLN
INTRAMUSCULAR | Status: DC | PRN
  Administered 2013-10-01: 30 mL via PERINEURAL

## 2013-10-01 MED ORDER — ATENOLOL 50 MG PO TABS
50.0000 mg | ORAL_TABLET | Freq: Every day | ORAL | Status: DC
  Administered 2013-10-02 – 2013-10-04 (×3): 50 mg via ORAL
  Filled 2013-10-01 (×3): qty 1

## 2013-10-01 MED ORDER — FENTANYL CITRATE 0.05 MG/ML IJ SOLN
INTRAMUSCULAR | Status: AC
  Filled 2013-10-01: qty 5

## 2013-10-01 MED ORDER — CLONIDINE HCL 0.3 MG PO TABS
0.3000 mg | ORAL_TABLET | Freq: Three times a day (TID) | ORAL | Status: DC
  Administered 2013-10-01 – 2013-10-04 (×8): 0.3 mg via ORAL
  Filled 2013-10-01 (×11): qty 1

## 2013-10-01 MED ORDER — SERTRALINE HCL 100 MG PO TABS
100.0000 mg | ORAL_TABLET | Freq: Every day | ORAL | Status: DC
  Administered 2013-10-02 – 2013-10-04 (×3): 100 mg via ORAL
  Filled 2013-10-01 (×3): qty 1

## 2013-10-01 MED ORDER — OXYCODONE HCL 5 MG PO TABS: 5.0000 mg | ORAL_TABLET | Freq: Once | ORAL | Status: DC | PRN

## 2013-10-01 MED ORDER — HYDRALAZINE HCL 20 MG/ML IJ SOLN
INTRAMUSCULAR | Status: DC | PRN
  Administered 2013-10-01 (×2): 5 mg via INTRAVENOUS

## 2013-10-01 MED ORDER — BUPIVACAINE LIPOSOME 1.3 % IJ SUSP
INTRAMUSCULAR | Status: DC | PRN
  Administered 2013-10-01: 20 mL

## 2013-10-01 MED ORDER — AMLODIPINE BESYLATE 10 MG PO TABS
10.0000 mg | ORAL_TABLET | Freq: Every day | ORAL | Status: DC
  Administered 2013-10-02 – 2013-10-04 (×3): 10 mg via ORAL
  Filled 2013-10-01 (×4): qty 1

## 2013-10-01 MED ORDER — METOCLOPRAMIDE HCL 5 MG/ML IJ SOLN
5.0000 mg | Freq: Three times a day (TID) | INTRAMUSCULAR | Status: DC | PRN
  Administered 2013-10-01: 10 mg via INTRAVENOUS
  Filled 2013-10-01: qty 2

## 2013-10-01 MED ORDER — SODIUM CHLORIDE 0.9 % IJ SOLN
INTRAMUSCULAR | Status: DC | PRN
  Administered 2013-10-01: 10 mL via INTRAVENOUS

## 2013-10-01 MED ORDER — FENTANYL CITRATE 0.05 MG/ML IJ SOLN
INTRAMUSCULAR | Status: DC | PRN
  Administered 2013-10-01: 25 ug via INTRAVENOUS
  Administered 2013-10-01: 50 ug via INTRAVENOUS
  Administered 2013-10-01: 100 ug via INTRAVENOUS
  Administered 2013-10-01: 50 ug via INTRAVENOUS
  Administered 2013-10-01: 25 ug via INTRAVENOUS

## 2013-10-01 MED ORDER — FENTANYL CITRATE 0.05 MG/ML IJ SOLN
50.0000 ug | INTRAMUSCULAR | Status: DC | PRN
  Administered 2013-10-01: 25 ug via INTRAVENOUS

## 2013-10-01 MED ORDER — OXYCODONE HCL 5 MG/5ML PO SOLN: 5.0000 mg | Freq: Once | ORAL | Status: DC | PRN

## 2013-10-01 MED ORDER — NEOSTIGMINE METHYLSULFATE 10 MG/10ML IV SOLN
INTRAVENOUS | Status: DC | PRN
  Administered 2013-10-01: 3 mg via INTRAVENOUS

## 2013-10-01 MED ORDER — INSULIN ASPART 100 UNIT/ML ~~LOC~~ SOLN
0.0000 [IU] | Freq: Three times a day (TID) | SUBCUTANEOUS | Status: DC
  Administered 2013-10-01 – 2013-10-02 (×2): 3 [IU] via SUBCUTANEOUS
  Administered 2013-10-02: 2 [IU] via SUBCUTANEOUS
  Administered 2013-10-02: 3 [IU] via SUBCUTANEOUS
  Administered 2013-10-03 (×2): 2 [IU] via SUBCUTANEOUS

## 2013-10-01 MED ORDER — HYDROMORPHONE HCL PF 1 MG/ML IJ SOLN
0.2500 mg | INTRAMUSCULAR | Status: DC | PRN
  Administered 2013-10-01 (×2): 0.5 mg via INTRAVENOUS

## 2013-10-01 MED ORDER — DOCUSATE SODIUM 100 MG PO CAPS
100.0000 mg | ORAL_CAPSULE | Freq: Two times a day (BID) | ORAL | Status: DC
  Administered 2013-10-01 – 2013-10-04 (×6): 100 mg via ORAL
  Filled 2013-10-01 (×7): qty 1

## 2013-10-01 MED ORDER — GLYCOPYRROLATE 0.2 MG/ML IJ SOLN
INTRAMUSCULAR | Status: DC | PRN
  Administered 2013-10-01: 0.4 mg via INTRAVENOUS
  Administered 2013-10-01: 0.2 mg via INTRAVENOUS

## 2013-10-01 MED ORDER — CLONAZEPAM 1 MG PO TABS
1.0000 mg | ORAL_TABLET | Freq: Every day | ORAL | Status: DC
  Administered 2013-10-01 – 2013-10-03 (×3): 1 mg via ORAL
  Filled 2013-10-01 (×3): qty 1

## 2013-10-01 MED ORDER — OXYCODONE HCL ER 10 MG PO T12A
10.0000 mg | EXTENDED_RELEASE_TABLET | Freq: Two times a day (BID) | ORAL | Status: DC
  Administered 2013-10-01 – 2013-10-04 (×6): 10 mg via ORAL
  Filled 2013-10-01 (×6): qty 1

## 2013-10-01 MED ORDER — CELECOXIB 200 MG PO CAPS
200.0000 mg | ORAL_CAPSULE | Freq: Two times a day (BID) | ORAL | Status: DC
  Administered 2013-10-01 – 2013-10-04 (×6): 200 mg via ORAL
  Filled 2013-10-01 (×7): qty 1

## 2013-10-01 MED ORDER — MIDAZOLAM HCL 2 MG/2ML IJ SOLN
INTRAMUSCULAR | Status: AC
  Filled 2013-10-01: qty 2

## 2013-10-01 MED ORDER — ROCURONIUM BROMIDE 50 MG/5ML IV SOLN
INTRAVENOUS | Status: AC
Start: 2013-10-01 — End: 2013-10-01
  Filled 2013-10-01: qty 1

## 2013-10-01 MED ORDER — SIMVASTATIN 10 MG PO TABS
10.0000 mg | ORAL_TABLET | Freq: Every day | ORAL | Status: DC
  Administered 2013-10-02 – 2013-10-03 (×2): 10 mg via ORAL
  Filled 2013-10-01 (×4): qty 1

## 2013-10-01 MED ORDER — ACETAMINOPHEN 650 MG RE SUPP: 650.0000 mg | Freq: Four times a day (QID) | RECTAL | Status: DC | PRN

## 2013-10-01 MED ORDER — 0.9 % SODIUM CHLORIDE (POUR BTL) OPTIME
TOPICAL | Status: DC | PRN
  Administered 2013-10-01: 1000 mL

## 2013-10-01 MED ORDER — OXYCODONE HCL 5 MG PO TABS
5.0000 mg | ORAL_TABLET | ORAL | Status: DC | PRN
  Administered 2013-10-01 – 2013-10-02 (×2): 10 mg via ORAL
  Filled 2013-10-01 (×2): qty 2

## 2013-10-01 MED ORDER — HYDROMORPHONE HCL PF 1 MG/ML IJ SOLN
INTRAMUSCULAR | Status: AC
  Administered 2013-10-01: 14:00:00
  Filled 2013-10-01: qty 1

## 2013-10-01 SURGICAL SUPPLY — 68 items
BANDAGE ELASTIC 4 VELCRO ST LF (GAUZE/BANDAGES/DRESSINGS) ×3 IMPLANT
BANDAGE ESMARK 6X9 LF (GAUZE/BANDAGES/DRESSINGS) ×1 IMPLANT
BLADE SAGITTAL 25.0X1.19X90 (BLADE) ×2 IMPLANT
BLADE SAGITTAL 25.0X1.19X90MM (BLADE) ×1
BLADE SAW SGTL 13X75X1.27 (BLADE) ×3 IMPLANT
BLADE SURG ROTATE 9660 (MISCELLANEOUS) IMPLANT
BNDG CMPR 9X6 STRL LF SNTH (GAUZE/BANDAGES/DRESSINGS)
BNDG CMPR MED 10X6 ELC LF (GAUZE/BANDAGES/DRESSINGS) ×1
BNDG ELASTIC 6X10 VLCR STRL LF (GAUZE/BANDAGES/DRESSINGS) ×3 IMPLANT
BNDG ESMARK 6X9 LF (GAUZE/BANDAGES/DRESSINGS)
BOWL SMART MIX CTS (DISPOSABLE) ×3 IMPLANT
CAPT RP KNEE ×2 IMPLANT
CEMENT HV SMART SET (Cement) ×6 IMPLANT
CHLORAPREP W/TINT 26ML (MISCELLANEOUS) ×3 IMPLANT
CLOSURE WOUND 1/2 X4 (GAUZE/BANDAGES/DRESSINGS) ×2
COVER SURGICAL LIGHT HANDLE (MISCELLANEOUS) ×3 IMPLANT
CUFF TOURNIQUET SINGLE 34IN LL (TOURNIQUET CUFF) ×3 IMPLANT
CUFF TOURNIQUET SINGLE 44IN (TOURNIQUET CUFF) IMPLANT
DRAPE EXTREMITY T 121X128X90 (DRAPE) ×3 IMPLANT
DRAPE PROXIMA HALF (DRAPES) ×3 IMPLANT
DRAPE U-SHAPE 47X51 STRL (DRAPES) ×3 IMPLANT
DRSG ADAPTIC 3X8 NADH LF (GAUZE/BANDAGES/DRESSINGS) ×3 IMPLANT
DRSG AQUACEL AG ADV 3.5X10 (GAUZE/BANDAGES/DRESSINGS) ×2 IMPLANT
DRSG PAD ABDOMINAL 8X10 ST (GAUZE/BANDAGES/DRESSINGS) ×3 IMPLANT
ELECT REM PT RETURN 9FT ADLT (ELECTROSURGICAL) ×3
ELECTRODE REM PT RTRN 9FT ADLT (ELECTROSURGICAL) ×1 IMPLANT
EVACUATOR 1/8 PVC DRAIN (DRAIN) IMPLANT
GAUZE SPONGE 4X4 12PLY STRL (GAUZE/BANDAGES/DRESSINGS) ×3 IMPLANT
GLOVE BIO SURGEON STRL SZ7 (GLOVE) ×3 IMPLANT
GLOVE BIO SURGEON STRL SZ7.5 (GLOVE) ×6 IMPLANT
GLOVE BIOGEL PI IND STRL 7.0 (GLOVE) ×1 IMPLANT
GLOVE BIOGEL PI IND STRL 8 (GLOVE) ×1 IMPLANT
GLOVE BIOGEL PI INDICATOR 7.0 (GLOVE) ×2
GLOVE BIOGEL PI INDICATOR 8 (GLOVE) ×2
GOWN STRL REUS W/ TWL LRG LVL3 (GOWN DISPOSABLE) ×3 IMPLANT
GOWN STRL REUS W/ TWL XL LVL3 (GOWN DISPOSABLE) ×2 IMPLANT
GOWN STRL REUS W/TWL LRG LVL3 (GOWN DISPOSABLE) ×9
GOWN STRL REUS W/TWL XL LVL3 (GOWN DISPOSABLE) ×6
HANDPIECE INTERPULSE COAX TIP (DISPOSABLE) ×3
HOOD PEEL AWAY FACE SHEILD DIS (HOOD) ×9 IMPLANT
IMMOBILIZER KNEE 20 (SOFTGOODS) IMPLANT
IMMOBILIZER KNEE 22 UNIV (SOFTGOODS) ×2 IMPLANT
KIT BASIN OR (CUSTOM PROCEDURE TRAY) ×3 IMPLANT
KIT ROOM TURNOVER OR (KITS) ×3 IMPLANT
MANIFOLD NEPTUNE II (INSTRUMENTS) ×3 IMPLANT
NS IRRIG 1000ML POUR BTL (IV SOLUTION) ×3 IMPLANT
PACK TOTAL JOINT (CUSTOM PROCEDURE TRAY) ×3 IMPLANT
PAD ARMBOARD 7.5X6 YLW CONV (MISCELLANEOUS) ×6 IMPLANT
PAD CAST 4YDX4 CTTN HI CHSV (CAST SUPPLIES) ×1 IMPLANT
PADDING CAST COTTON 4X4 STRL (CAST SUPPLIES) ×3
PADDING CAST COTTON 6X4 STRL (CAST SUPPLIES) ×3 IMPLANT
SET HNDPC FAN SPRY TIP SCT (DISPOSABLE) ×1 IMPLANT
STRIP CLOSURE SKIN 1/2X4 (GAUZE/BANDAGES/DRESSINGS) ×4 IMPLANT
SUCTION FRAZIER TIP 10 FR DISP (SUCTIONS) ×3 IMPLANT
SUT ETHIBOND NAB CT1 #1 30IN (SUTURE) ×3 IMPLANT
SUT VIC AB 0 CT1 27 (SUTURE) ×6
SUT VIC AB 0 CT1 27XBRD ANBCTR (SUTURE) ×2 IMPLANT
SUT VIC AB 1 CT1 27 (SUTURE) ×6
SUT VIC AB 1 CT1 27XBRD ANTBC (SUTURE) IMPLANT
SUT VIC AB 1 CTB1 27 (SUTURE) ×6 IMPLANT
SUT VIC AB 2-0 CT1 27 (SUTURE) ×6
SUT VIC AB 2-0 CT1 TAPERPNT 27 (SUTURE) ×2 IMPLANT
SUT VLOC 180 0 24IN GS25 (SUTURE) ×2 IMPLANT
SYRINGE 60CC LL (MISCELLANEOUS) ×2 IMPLANT
TOWEL OR 17X24 6PK STRL BLUE (TOWEL DISPOSABLE) ×3 IMPLANT
TOWEL OR 17X26 10 PK STRL BLUE (TOWEL DISPOSABLE) ×3 IMPLANT
TRAY FOLEY CATH 16FRSI W/METER (SET/KITS/TRAYS/PACK) IMPLANT
WATER STERILE IRR 1000ML POUR (IV SOLUTION) ×6 IMPLANT

## 2013-10-01 NOTE — Anesthesia Preprocedure Evaluation (Addendum)
Anesthesia Evaluation  Patient identified by MRN, date of birth, ID band Patient awake    Reviewed: Allergy & Precautions, H&P , NPO status , Patient's Chart, lab work & pertinent test results, reviewed documented beta blocker date and time   Airway Mallampati: III TM Distance: >3 FB Neck ROM: Full    Dental  (+) Teeth Intact, Dental Advisory Given   Pulmonary neg pulmonary ROS,  breath sounds clear to auscultation        Cardiovascular hypertension, Pt. on home beta blockers and Pt. on medications Rhythm:Regular Rate:Bradycardia     Neuro/Psych PSYCHIATRIC DISORDERS Anxiety Depression Bipolar Disorder negative neurological ROS     GI/Hepatic negative GI ROS, Neg liver ROS,   Endo/Other  diabetes, Type 2, Oral Hypoglycemic Agents  Renal/GU CRFRenal disease  negative genitourinary   Musculoskeletal  (+) Arthritis -, Osteoarthritis,    Abdominal   Peds  Hematology negative hematology ROS (+)   Anesthesia Other Findings   Reproductive/Obstetrics negative OB ROS                          Anesthesia Physical Anesthesia Plan  ASA: III  Anesthesia Plan: General   Post-op Pain Management:    Induction:   Airway Management Planned: Oral ETT  Additional Equipment: None  Intra-op Plan:   Post-operative Plan: Extubation in OR  Informed Consent: I have reviewed the patients History and Physical, chart, labs and discussed the procedure including the risks, benefits and alternatives for the proposed anesthesia with the patient or authorized representative who has indicated his/her understanding and acceptance.   Dental advisory given  Plan Discussed with: CRNA and Anesthesiologist  Anesthesia Plan Comments:        Anesthesia Quick Evaluation

## 2013-10-01 NOTE — Progress Notes (Signed)
Orthopedic Tech Progress Note Patient Details:  JAZZMA NEIDHARDT 1950-04-05 [id number]  Patient ID: Veatrice Bourbon, female   DOB: May 28, 1950, 63 y.o.   MRN: [id number] Placed pt's lle in cpm @ 0-90 degrees @1940   Hildred Priest 10/01/2013, 7:34 PM

## 2013-10-01 NOTE — Progress Notes (Signed)
Utilization review completed.  

## 2013-10-01 NOTE — H&P (Signed)
TOTAL KNEE ADMISSION H&P  Patient is being admitted for left total knee arthroplasty.  Subjective:  Chief Complaint:left knee pain.  HPI: Caitlin Morris, 63 y.o. female, has a history of pain and functional disability in the left knee due to arthritis and has failed non-surgical conservative treatments for greater than 12 weeks to includeNSAID's and/or analgesics, corticosteriod injections and activity modification.  Onset of symptoms was gradual, starting >10 years ago with gradually worsening course since that time. The patient noted no past surgery on the left knee(s).   Patient has worsening of pain with activity and weight bearing, pain that interferes with activities of daily living, crepitus and joint swelling.  Patient has evidence of subchondral sclerosis, periarticular osteophytes and joint space narrowing by imaging studies.There is no active infection.  Patient Active Problem List   Diagnosis Date Noted  . DENTAL PAIN 10/16/2009  . VAGINAL DISCHARGE 07/07/2009  . ARTHRALGIA 07/07/2009  . RENAL INSUFFICIENCY 06/21/2009  . DEGENERATIVE JOINT DISEASE, LEFT KNEE 05/21/2009  . KNEE PAIN, LEFT 05/05/2009  . WEIGHT GAIN 05/05/2009  . PROTEINURIA 05/05/2009  . BIPOLAR DISORDER UNSPECIFIED 04/04/2008  . HYPERTENSION 10/31/2007  . OSTEOPENIA 10/24/2005  . HYPERCALCEMIA 01/24/2005   Past Medical History  Diagnosis Date  . Hypertension   . Anxiety   . Depression   . Diabetes mellitus without complication   . Hyperlipidemia   . Obesity     Past Surgical History  Procedure Laterality Date  . No past surgeries      Prescriptions prior to admission  Medication Sig Dispense Refill  . amLODipine (NORVASC) 10 MG tablet Take 10 mg by mouth daily.      Marland Kitchen atenolol (TENORMIN) 50 MG tablet Take 50 mg by mouth daily.      . clonazePAM (KLONOPIN) 1 MG tablet Take 1 mg by mouth at bedtime.       . cloNIDine (CATAPRES) 0.3 MG tablet Take 0.3-0.6 mg by mouth 2 (two) times daily. Takes  0.3mg  in the morning and 0.6mg  in the evening      . glipiZIDE (GLUCOTROL XL) 5 MG 24 hr tablet Take 5 mg by mouth daily with breakfast.      . lisinopril-hydrochlorothiazide (PRINZIDE,ZESTORETIC) 20-25 MG per tablet Take 1 tablet by mouth daily.      . pravastatin (PRAVACHOL) 20 MG tablet Take 20 mg by mouth daily.      . sertraline (ZOLOFT) 100 MG tablet Take 100 mg by mouth daily.       Allergies  Allergen Reactions  . Codeine     REACTION: Swells    History  Substance Use Topics  . Smoking status: Never Smoker   . Smokeless tobacco: Never Used  . Alcohol Use: No    History reviewed. No pertinent family history.   Review of Systems  All other systems reviewed and are negative.   Objective:  Physical Exam  Constitutional: She is oriented to person, place, and time. She appears well-developed and well-nourished.  HENT:  Head: Atraumatic.  Eyes: EOM are normal.  Cardiovascular: Intact distal pulses.   Respiratory: Effort normal.  Musculoskeletal:  L knee pain/crepitance with limited ROM.  Neurological: She is alert and oriented to person, place, and time.  Skin: Skin is warm and dry.  Psychiatric: She has a normal mood and affect.    Vital signs in last 24 hours: Temp:  [97.6 F (36.4 C)] 97.6 F (36.4 C) (09/08 0836) Pulse Rate:  [43-44] 43 (09/08 0913) Resp:  [20-22] 22 (  09/08 0913) BP: (194)/(97) 194/97 mmHg (09/08 0836) SpO2:  [95 %-100 %] 95 % (09/08 0913) Weight:  [84.823 kg (187 lb)] 84.823 kg (187 lb) (09/08 0836)  Labs:   Estimated body mass index is 35.35 kg/(m^2) as calculated from the following:   Height as of 09/20/13: 5\' 1"  (1.549 m).   Weight as of this encounter: 84.823 kg (187 lb).   Imaging Review Plain radiographs demonstrate severe degenerative joint disease of the left knee(s). The overall alignment ismild varus. The bone quality appears to be good for age and reported activity level.  Assessment/Plan:  End stage arthritis, left knee    The patient history, physical examination, clinical judgment of the provider and imaging studies are consistent with end stage degenerative joint disease of the left knee(s) and total knee arthroplasty is deemed medically necessary. The treatment options including medical management, injection therapy arthroscopy and arthroplasty were discussed at length. The risks and benefits of total knee arthroplasty were presented and reviewed. The risks due to aseptic loosening, infection, stiffness, patella tracking problems, thromboembolic complications and other imponderables were discussed. The patient acknowledged the explanation, agreed to proceed with the plan and consent was signed. Patient is being admitted for inpatient treatment for surgery, pain control, PT, OT, prophylactic antibiotics, VTE prophylaxis, progressive ambulation and ADL's and discharge planning. The patient is planning to be discharged home with home health services

## 2013-10-01 NOTE — Transfer of Care (Signed)
Immediate Anesthesia Transfer of Care Note  Patient: Caitlin Morris  Procedure(s) Performed: Procedure(s) with comments: TOTAL KNEE ARTHROPLASTY (Left) - Left total knee arthroplasty  Patient Location: PACU  Anesthesia Type:General  Level of Consciousness: awake, alert  and oriented  Airway & Oxygen Therapy: Patient Spontanous Breathing  Post-op Assessment: Report given to PACU RN  Post vital signs: Reviewed and stable  Complications: No apparent anesthesia complications

## 2013-10-01 NOTE — Progress Notes (Signed)
Orthopedic Tech Progress Note Patient Details:  Caitlin Morris 09-28-1950 [id number]  CPM Left Knee CPM Left Knee: On Left Knee Flexion (Degrees): 90 Left Knee Extension (Degrees): 0 Additional Comments: traperze bar patient helper Ortho has no bone foam at this time. When it becomes available, it will be provided for pt. RN notified     Hildred Priest 10/01/2013, 1:54 PM

## 2013-10-01 NOTE — Anesthesia Procedure Notes (Signed)
Anesthesia Regional Block:  Femoral nerve block  Pre-Anesthetic Checklist: ,, timeout performed, Correct Patient, Correct Site, Correct Laterality, Correct Procedure, Correct Position, site marked, Risks and benefits discussed,  Surgical consent,  Pre-op evaluation,  At surgeon's request and post-op pain management  Laterality: Left  Prep: chloraprep       Needles:  Injection technique: Single-shot  Needle Type: Echogenic Stimulator Needle     Needle Length: 9cm 9 cm Needle Gauge: 22 and 22 G    Additional Needles:  Procedures: ultrasound guided (picture in chart) and nerve stimulator Femoral nerve block  Nerve Stimulator or Paresthesia:  Response: quadraceps contraction, 0.45 mA,   Additional Responses:   Narrative:  Start time: 10/01/2013 9:45 AM End time: 10/01/2013 9:55 AM Injection made incrementally with aspirations every 5 mL.  Performed by: Personally  Anesthesiologist: Suzette Battiest, MD  Additional Notes: Functioning IV was confirmed and monitors were applied.  A 56mm 22ga Arrow echogenic stimulator needle was used. Sterile prep and drape,hand hygiene and sterile gloves were used. Ultrasound guidance: relevant anatomy identified, needle position confirmed, local anesthetic spread visualized around nerve(s)., vascular puncture avoided.  Image printed for medical record. Negative aspiration and negative test dose prior to incremental administration of local anesthetic. The patient tolerated the procedure well.

## 2013-10-01 NOTE — Anesthesia Postprocedure Evaluation (Signed)
  Anesthesia Post-op Note  Patient: Caitlin Morris  Procedure(s) Performed: Procedure(s) with comments: TOTAL KNEE ARTHROPLASTY (Left) - Left total knee arthroplasty  Patient Location: PACU  Anesthesia Type:GA combined with regional for post-op pain  Level of Consciousness: awake, alert  and oriented  Airway and Oxygen Therapy: Patient Spontanous Breathing  Post-op Pain: mild  Post-op Assessment: Post-op Vital signs reviewed  Post-op Vital Signs: Reviewed  Last Vitals:  Filed Vitals:   10/01/13 1501  BP: 125/65  Pulse: 77  Temp: 36.2 C  Resp: 18    Complications: No apparent anesthesia complications

## 2013-10-01 NOTE — Evaluation (Signed)
Physical Therapy Evaluation Patient Details Name: Caitlin Morris MRN: [id number] DOB: 02-14-1950 Today's Date: 10/01/2013   History of Present Illness  63 y.o. female admitted to [facility] on 10/01/13 for elective L TKA.  Pt with significant PMHx of HTN, anxiety, depression, and DM.  Clinical Impression  Pt is POD #0 and is lethargic, nauseated.  She was able to get to the [facility] to attempt to urinate (she is retaining urine post-op) with two person assist and the RW. She was buckling over her left leg (even with KI on) and was unable to un-weight her body with her arms to take steps without two person assist.  Pt would like to d/c home with HHPT, but it will depend on her progress with therapy.   PT to follow acutely for deficits listed below.       Follow Up Recommendations Home health PT;Supervision/Assistance - 24 hour    Equipment Recommendations  None recommended by PT    Recommendations for Other Services   NA    Precautions / Restrictions Precautions Precautions: Knee;Fall Required Braces or Orthoses: Knee Immobilizer - Left Knee Immobilizer - Left: On at all times Restrictions Weight Bearing Restrictions: Yes LLE Weight Bearing: Weight bearing as tolerated      Mobility  Bed Mobility Overal bed mobility: Needs Assistance Bed Mobility: Supine to Sit     Supine to sit: Min assist     General bed mobility comments: Min assist to help progress her left leg to EOB and then support trunk during transitions to sitting.  Verbal cues for hand placement and sequencing.   Transfers Overall transfer level: Needs assistance Equipment used: Rolling walker (2 wheeled) Transfers: Sit to/from [facility] Sit to Stand: +2 physical assistance;Min assist Stand pivot transfers: +2 physical assistance;Mod assist       General transfer comment: Two person min assist to stand from both bed and BSC.  Verbal cues for safe hand placement and support needed at trunk to support body  over painful left leg.  Pt needed two person mod assist to pivot because her left leg was buckling at attempts at stepping and two people needed to [facility] her body and prevent falls.    Ambulation/Gait Ambulation/Gait assistance: +2 physical assistance;Mod assist Ambulation Distance (Feet): 5 Feet Assistive device: Rolling walker (2 wheeled) Gait Pattern/deviations: Step-to pattern;Antalgic (buckling in stance on left knee)     General Gait Details: Pt even with KI on left knee would buckle in stance and two people needed to support trunk to prevent buckling to the floot.  Pt having difficulty unweighting her own body weight with her arms on RW.          Balance Overall balance assessment: Needs assistance Sitting-balance support: Feet supported;No upper extremity supported Sitting balance-Leahy Scale: Good     Standing balance support: Bilateral upper extremity supported Standing balance-Leahy Scale: Poor Standing balance comment: pt needs two people and a RW to stand safely at this time.                              Pertinent Vitals/Pain Pain Assessment: No/denies pain    Home Living Family/patient expects to be discharged to:: Private residence Living Arrangements: Other relatives (10 y.o. granddaughter who is not in school) Available Help at Discharge: Family;Available 24 hours/day Type of Home: House Home Access: Stairs to enter Entrance Stairs-Rails: Right Entrance Stairs-Number of Steps: 3 Home Layout: One level Home Equipment:  Walker - 4 wheels;Bedside commode      Prior Function Level of Independence: Independent with assistive device(s)         Comments: used rollator PTA?        Extremity/Trunk Assessment   Upper Extremity Assessment: Defer to OT evaluation           Lower Extremity Assessment: LLE deficits/detail   LLE Deficits / Details: left leg with normal post op pain and weakness. Generally 2/5 strength throughout, and even  in knee immobilizer, the pt was buckling when WB on her left leg.   Cervical / Trunk Assessment: Normal  Communication   Communication: No difficulties  Cognition Arousal/Alertness: Lethargic;Suspect due to medications Behavior During Therapy: Flat affect Overall Cognitive Status: Difficult to assess                               Assessment/Plan    PT Assessment Patient needs continued PT services  PT Diagnosis Difficulty walking;Abnormality of gait;Generalized weakness;Acute pain   PT Problem List Decreased strength;Decreased range of motion;Decreased activity tolerance;Decreased balance;Decreased mobility;Decreased knowledge of use of DME;Decreased knowledge of precautions;Obesity;Pain  PT Treatment Interventions DME instruction;Gait training;Stair training;Functional mobility training;Therapeutic activities;Therapeutic exercise;Balance training;Neuromuscular re-education;Patient/family education;Modalities   PT Goals (Current goals can be found in the Care Plan section) Acute Rehab PT Goals Patient Stated Goal: to go home at discharge PT Goal Formulation: With patient Time For Goal Achievement: 10/08/13 Potential to Achieve Goals: Good    Frequency 7X/week    End of Session Equipment Utilized During Treatment: Gait belt;Left knee immobilizer Activity Tolerance: Patient limited by fatigue;Patient limited by pain Patient left: in bed;with call bell/phone within reach Nurse Communication: Mobility status;Other (comment) (need for two people assist)         Time: 1600-1630 PT Time Calculation (min): 30 min   Charges:   PT Evaluation $Initial PT Evaluation Tier I: 1 Procedure PT Treatments $Therapeutic Activity: 8-22 mins        Akul Leggette B. [place], [place], DPT [phone]   10/01/2013, 4:38 PM

## 2013-10-01 NOTE — Op Note (Signed)
Procedure(s): TOTAL KNEE ARTHROPLASTY Procedure Note  Caitlin Morris female 63 y.o. 10/01/2013  Procedure(s) and Anesthesia Type:    * LEFT TOTAL KNEE ARTHROPLASTY - Choice  Surgeon(s) and Role:    * Nita Sells, MD - Primary   Indications:  63 y.o. female  With endstage left knee arthritis. Pain and dysfunction interfered with quality of life and nonoperative treatment with activity modification, NSAIDS and injections failed.     Surgeon: Nita Sells   Assistants: Jeanmarie Hubert PA-C [facility] was present and scrubbed throughout the procedure and was essential in positioning, retraction, exposure, and closure)  Anesthesia: General endotracheal anesthesia with preoperative femoral block given by the attending anesthesiologist    Procedure Detail  Findings: Size 2.5 femur and tibia, DePuy Sigma, fixed-bearing, cemented. A 38 cemented patellar component. Size 10 tibial poly-.  Estimated Blood Loss:  200 mL         Drains: None  Blood Given: none          Specimens: none        Complications:  * No complications entered in OR log *         Disposition: PACU - hemodynamically stable.         Condition: stable   Procedure:   The patient was identified in the preoperative holding area where I personally marked the operative extremity and then was taken to the operating room where She was transferred to the operative table with all extremities padded to   protect against neurovascular compromise.  The patient received 1 g of   IV Ancef preoperatively.  After general endotracheal anesthesia was   induced without complication, the left lower extremity was prepped and draped in the standard sterile fashion.  The appropriate time out procedure was carried out with myself the OR staff and anesthesia staff all verifying site, side and procedure.  An esmarch dressing was used to exsanguinate the limb, and the tourniquet was elevated to 350 mmHg.  An  approximately 15-cm incision was made over the anterior knee.   Dissection was carried down through the subcutaneous tissue and a medial   flap was developed over the vastus medialis.  The knife was then used to   create a medial parapatellar arthrotomy.  A medial release was performed   subperiosteally and dissection was carried beneath the patellar tendon.   The bulk of the patellar fat pad was excised.  Medial and lateral   synovectomy was done over the anterior distal femur.  A small amount of   periosteum was removed in a half-moon shape over the distal femur to   allow later visualization when cutting.  The patella was everted, and   the knee was then flexed.  The medial meniscus was excised.  The osteophytes in the notch were excised using an osteotome. The ACL and   PCL were excised.  The tibia was then subluxated anteriorly, and a bent   Hohmann retractor was placed just lateral to the lateral meniscus.  The   lateral meniscus was then excised.  The proximal tibia was exposed.  The   tibia was then reduced, and Z-retractors were placed medially and   laterally.  The intramedullary drill was used to establish the  intramedullary canal.  The valgus cutting guide was then placed at 5  degrees and a 10-mm cut.  It was pinned in place, and the distal valgus   cut was made with a saw.  Cutting guide was then removed,  and attention   was turned to the tibia where again the tibia was subluxated anteriorly   and Z-retractor medially and bent Hohmann laterally.  The extramedullary   tibial guide was then used to establish the proximal tibial cut based on   the palpable anterior tibial crest and based on preoperative templating.   The guide was pinned and the tibia was cut taking care to protect medial   and lateral collateral ligaments.  After the cut was made, the guide was removed,   and the knee was extended.  A 10-mm spacer block was found to be   Perfect fit.  The knee easily came to  full extension.   The knee was flexed and posterior upsizing guide was used to obtain a   size 2-2.5 femur, and a 2.5 was selected..  It was pinned for size 2.5, and the AP cutting guide was   placed.  The cutting guide was used to ensure no anterior notching, and   the anterior-posterior cuts were then made protecting ligaments.   Anterior, posterior, and chamfer cuts were made.  Guide was removed and   flexion gap was assessed with a 10-mm spacer block and found to be well   balanced.  Notch cutting guide was then placed, and the femoral notch was cut.   Guide was removed.  The femoral trial implant was placed and found to   fit well.  Tibial tray was then placed with a 2.5 tibial trial.  The knee   was balanced in flexion/extension.  Tibial rotation of the tray was   marked with a Bovie on the anterior proximal tibia.  The patella was   then everted and prepared.  The caliper was used to measure the thickness of   the patella.  After patellar cut, the patellar drilling guide was used to   drill the appropriate pegs and trial patella was placed with good   reapproximation of the patellar height and good tracking with no   significant patellar lift-off.  The trial was then removed, and the   trial distal femur and proximal tibial components were removed.  Tibial   retractors were again placed and the tibia was exposed for preparation.   Size   2.5tibial tray was found to fit well, it was pinned in place in the   appropriate rotation as previously marked, and excess osteophytes were   removed.  The tibia was then prepared by reaming and broaching.  Pulse   lavage were then used to clean the femur proximal tibia as well as   patella.  They were dried, and the final implants were opened, the   cement was mixed.  Prior to cementing and bone plug from the notch cut   was placed in the distal femur intramedullary hole effectively plugging   it.  The tibia was then cemented and tibial tray was  impacted and cement was   cleaned.  It was re-impacted and again the cement was cleaned.   Attention was then turned to the femur where the cement was placed, and   the femur was impacted taking care not to place in flexion.  It was   impacted in place and cement was cleaned.  Tibial trial was then   inserted.  The knee was brought into extension and held into extension   with a slight valgus force. The patella was then cemented and clamped.   This position was held until the cement was completely hardened.  The   knee was then flexed, again tibial trial was removed.  Bone hook was   used to distract femur from the tibia and moist lap was used to clean   the cement from the posterior femur.  This was copiously irrigated and   all cement was removed.  The final tibial  tray was inserted and impacted   into place.  Final range of motion and stability was excellent with good   patellar tracking.  Tourniquet was let down for a total tourniquet time   of  98 minutes at 350 mmHg.  Small bleeders were coagulated   using electrocautery.  The joint was then irrigated copiously with pulse lavage.  the knee was closed with  0 V lock suture for closure of the arthrotomy, 2-0 Vicryl and  staples for skin closure.    Sterile dressings were then applied including  aquacell and a large Ace bandage from the toes to the thigh.  The   patient was then placed in a knee immobilizer, allowed to awaken from   general anesthesia, transferred to stretcher, and taken to the recovery   room in stable condition.      POSTOPERATIVE PLAN:  The patient will be on  aspirin twice daily for VTE prophylaxis.  Physical Therapy and CPM will be started in the hospital.

## 2013-10-02 DIAGNOSIS — M1710 Unilateral primary osteoarthritis, unspecified knee: Secondary | ICD-10-CM | POA: Diagnosis not present

## 2013-10-02 DIAGNOSIS — M25569 Pain in unspecified knee: Secondary | ICD-10-CM | POA: Diagnosis not present

## 2013-10-02 LAB — GLUCOSE, CAPILLARY
GLUCOSE-CAPILLARY: 154 mg/dL — AB (ref 70–99)
Glucose-Capillary: 148 mg/dL — ABNORMAL HIGH (ref 70–99)
Glucose-Capillary: 155 mg/dL — ABNORMAL HIGH (ref 70–99)
Glucose-Capillary: 131 mg/dL — ABNORMAL HIGH (ref 70–99)

## 2013-10-02 LAB — CBC
HCT: 31.7 % — ABNORMAL LOW (ref 36.0–46.0)
HEMOGLOBIN: 10.2 g/dL — AB (ref 12.0–15.0)
MCH: 27.5 pg (ref 26.0–34.0)
MCHC: 32.2 g/dL (ref 30.0–36.0)
MCV: 85.4 fL (ref 78.0–100.0)
PLATELETS: 242 10*3/uL (ref 150–400)
RBC: 3.71 MIL/uL — AB (ref 3.87–5.11)
RDW: 15.1 % (ref 11.5–15.5)
WBC: 7.6 10*3/uL (ref 4.0–10.5)

## 2013-10-02 LAB — BASIC METABOLIC PANEL
Anion gap: 12 (ref 5–15)
BUN: 17 mg/dL (ref 6–23)
CO2: 29 meq/L (ref 19–32)
CREATININE: 1.14 mg/dL — AB (ref 0.50–1.10)
Calcium: 9.1 mg/dL (ref 8.4–10.5)
Chloride: 100 mEq/L (ref 96–112)
GFR calc Af Amer: 57 mL/min — ABNORMAL LOW (ref >90)
GFR calc non Af Amer: 49 mL/min — ABNORMAL LOW (ref >90)
GLUCOSE: 147 mg/dL — AB (ref 70–99)
Potassium: 3.5 mEq/L — ABNORMAL LOW (ref 3.7–5.3)
Sodium: 141 mEq/L (ref 137–147)

## 2013-10-02 NOTE — Progress Notes (Signed)
   PATIENT ID: Caitlin Morris   1 Day Post-Op Procedure(s) (LRB): TOTAL KNEE ARTHROPLASTY (Left)  Subjective: Reports some discomfort in left knee, slept well overnight. Reports some numbness and tingling distally from block. No other complaints or concerns.   Objective:  Filed Vitals:   10/02/13 0423  BP: 122/64  Pulse: 54  Temp: 98.6 F (37 C)  Resp: 18     L knee dressing/ACE c/d/i Wiggles toes, distally some decreased sensation to light touch  Calves soft/nontender  Labs:   Recent Labs  10/02/13 0432  HGB 10.2*   Recent Labs  10/02/13 0432  WBC 7.6  RBC 3.71*  HCT 31.7*  PLT 242   Recent Labs  10/02/13 0432  NA 141  K 3.5*  CL 100  CO2 29  BUN 17  CREATININE 1.14*  GLUCOSE 147*  CALCIUM 9.1    Assessment and Plan: 1 day s/p left TKA Up with PT today, continue CPM Will reassess function tomorrow after block has fully worn off Anticipate d/c tomorrow   VTE proph: ASA 325mg  BID, SCDs

## 2013-10-02 NOTE — Care Management Note (Signed)
CARE MANAGEMENT NOTE 10/02/2013  Patient:  Caitlin Morris, Caitlin Morris   Account Number:  [id number]  Date Initiated:  10/02/2013  Documentation initiated by:  Ricki Miller  Subjective/Objective Assessment:   63 yr old female admitted with left knee osteoarthritis, s/p left knee arthroplasty.     Action/Plan:   Case manager spoke with patient concerning home health and DME needs at discharge. patient preoperatively setup with [facility], no changes. Has rolling walker and 3in1.   Anticipated DC Date:  10/03/2013   Anticipated DC Plan:  [place]  CM consult      [facility] Choice  HOME HEALTH  DURABLE MEDICAL EQUIPMENT   Choice offered to / List presented to:  C-1 Patient   DME arranged  CP[facility]  WALKER - ROLLING  3-N-1      DME agency  [employer]     [place] arranged  HH-2 PT      [place] agency  [facility]   Status of service:  Completed, signed off Medicare Important Message given?   (If response is "NO", the following Medicare IM given date fields will be blank) Date Medicare IM given:   Medicare IM given by:   Date Additional Medicare IM given:   Additional Medicare IM given by:    Discharge Disposition:  [place]  Per UR Regulation:  Reviewed for med. necessity/level of care/duration of stay

## 2013-10-02 NOTE — Progress Notes (Signed)
Physical Therapy Treatment Patient Details Name: Caitlin Morris MRN: [id number] DOB: [birth year] Today's Date: 10/02/2013    History of Present Illness 63 y.o. female admitted to [facility] on 10/01/13 for elective L TKA.  Pt with significant PMHx of HTN, anxiety, depression, and DM.    PT Comments    Patient progressing slowly this session and able to complete small bout of ambulation. Still having some buckling of LLE. Patient able to complete some exercises as well this morning. Will see again this afternoon for progressive ambulation.   Follow Up Recommendations  Home health PT;Supervision/Assistance - 24 hour     Equipment Recommendations  None recommended by PT    Recommendations for Other Services       Precautions / Restrictions Precautions Precautions: Knee;Fall Required Braces or Orthoses: Knee Immobilizer - Left Knee Immobilizer - Left: On at all times Restrictions Weight Bearing Restrictions: Yes LLE Weight Bearing: Weight bearing as tolerated    Mobility  Bed Mobility Overal bed mobility: Needs Assistance Bed Mobility: Supine to Sit     Supine to sit: Min assist     General bed mobility comments: Min assist to help progress her left leg to EOB and then support trunk during transitions to sitting.  Verbal cues for hand placement and sequencing.   Transfers Overall transfer level: Needs assistance Equipment used: Rolling walker (2 wheeled)   Sit to Stand: Min assist         General transfer comment: A to power up into standing. Cues for safe hand placement  Ambulation/Gait Ambulation/Gait assistance: Min assist;+2 safety/equipment Ambulation Distance (Feet): 30 Feet   Gait Pattern/deviations: Step-to pattern;Decreased stance time - right;Decreased step length - left   Gait velocity interpretation: Below normal speed for age/gender General Gait Details: Patient with some buckling still in KI. Cues and A for RW management and gait sequence. Limited  ambulation due to increase pain and fatigue   Stairs            Wheelchair Mobility    Modified Rankin (Stroke Patients Only)       Balance                                    Cognition Arousal/Alertness: Awake/alert Behavior During Therapy: Flat affect Overall Cognitive Status: Within Functional Limits for tasks assessed                      Exercises Total Joint Exercises Ankle Circles/Pumps: AROM;Both;10 reps Quad Sets: AROM;Left;10 reps Heel Slides: AAROM;Left;10 reps Hip ABduction/ADduction: AAROM;Left;10 reps Straight Leg Raises: AAROM;Left;10 reps    General Comments        Pertinent Vitals/Pain Pain Assessment: 0-10 Pain Score: 6  Pain Location: L knee Pain Descriptors / Indicators: Aching Pain Intervention(s): Monitored during session;Repositioned    Home Living                      Prior Function            PT Goals (current goals can now be found in the care plan section) Progress towards PT goals: Progressing toward goals    Frequency  7X/week    PT Plan Current plan remains appropriate    Co-evaluation             End of Session Equipment Utilized During Treatment: Gait belt;Left knee immobilizer Activity Tolerance: Patient limited by fatigue;Patient limited  by pain Patient left: in chair;with call bell/phone within reach     Time: 0840-0908 PT Time Calculation (min): 28 min  Charges:  $Gait Training: 8-22 mins $Therapeutic Exercise: 8-22 mins                    G Codes:      Jacqualyn Posey 10/02/2013, 9:50 AM  10/02/2013 Jacqualyn Posey PTA [phone] pager [phone] office

## 2013-10-02 NOTE — Progress Notes (Signed)
Physical Therapy Treatment Patient Details Name: Caitlin Morris MRN: [id number] DOB: 09-11-1950 Today's Date: 10/02/2013    History of Present Illness 63 y.o. female admitted to [facility] on 10/01/13 for elective L TKA.  Pt with significant PMHx of HTN, anxiety, depression, and DM.    PT Comments    Patient had just gotten back to bed with nursing but agreeable to hall ambulation. Motivated to walk. Family present this afternoon. Patient is hopeful she will be able to DC home tomorrow. Will need to practice steps tomorrow if she is planning on discharge home. Will see how her mobility is and plan on two session of therapy tomorrow  Follow Up Recommendations  Home health PT;Supervision/Assistance - 24 hour     Equipment Recommendations  None recommended by PT    Recommendations for Other Services       Precautions / Restrictions Precautions Precautions: Knee;Fall Required Braces or Orthoses: Knee Immobilizer - Left Knee Immobilizer - Left: On at all times Restrictions LLE Weight Bearing: Weight bearing as tolerated    Mobility  Bed Mobility Overal bed mobility: Needs Assistance Bed Mobility: Supine to Sit;Sit to Supine     Supine to sit: Min assist Sit to supine: Min assist   General bed mobility comments: Min A with LE out of bed. Patient able to work trunk up out of bed better this session with more ease and control   Transfers Overall transfer level: Needs assistance Equipment used: Rolling walker (2 wheeled)   Sit to Stand: Min assist         General transfer comment: A to power up into standing. Cues for safe hand placement  Ambulation/Gait Ambulation/Gait assistance: Min guard Ambulation Distance (Feet): 60 Feet Assistive device: Rolling walker (2 wheeled) Gait Pattern/deviations: Step-to pattern;Decreased stance time - right;Decreased step length - left   Gait velocity interpretation: Below normal speed for age/gender General Gait Details: Patient cued for  gait sequence. Much better control of RW this session.    Stairs            Wheelchair Mobility    Modified Rankin (Stroke Patients Only)       Balance                                    Cognition Arousal/Alertness: Awake/alert Behavior During Therapy: WFL for tasks assessed/performed Overall Cognitive Status: Within Functional Limits for tasks assessed                      Exercises      General Comments        Pertinent Vitals/Pain Pain Score: 6  Pain Location: L knee Pain Descriptors / Indicators: Aching Pain Intervention(s): Monitored during session;Repositioned    Home Living                      Prior Function            PT Goals (current goals can now be found in the care plan section) Progress towards PT goals: Progressing toward goals    Frequency  7X/week    PT Plan Current plan remains appropriate    Co-evaluation             End of Session Equipment Utilized During Treatment: Gait belt;Left knee immobilizer Activity Tolerance: Patient tolerated treatment well Patient left: in bed;with call bell/phone within reach;in CPM  Time: 1103-1594 PT Time Calculation (min): 24 min  Charges:  $Gait Training: 8-22 mins $Therapeutic Activity: 8-22 mins                    G Codes:      Jacqualyn Posey 10/02/2013, 2:59 PM 10/02/2013 Jacqualyn Posey PTA [phone] pager [phone] office

## 2013-10-02 NOTE — Progress Notes (Addendum)
Occupational Therapy Evaluation Patient Details Name: Caitlin Morris MRN: [id number] DOB: 24-Mar-1950 Today's Date: 10/02/2013    History of Present Illness 63 y.o. female admitted to [facility] on 10/01/13 for elective L TKA.  Pt with significant PMHx of HTN, anxiety, depression, and DM.   Clinical Impression   PTA pt lived at home and was independent with ADLs and functional mobility, although she states that she rarely wore socks and often wore slip on shoes or flip-flops. Pt was returning to bed from bathroom when OT arrived and declined getting OOB with OT at this time. Educated pt on techniques to increase safety at home through fall prevention and environmental modification. Encouraged pt to continue to work with PT/OT. Pt will benefit from skilled OT to increase LB ADLs and functional mobility prior to d/c.     Follow Up Recommendations  No OT follow up;Supervision/Assistance - 24 hour    Equipment Recommendations  3 in 1 bedside comode;Other (comment) (AE (hip kit))    Recommendations for Other Services       Precautions / Restrictions Precautions Precautions: Knee;Fall Required Braces or Orthoses: Knee Immobilizer - Left Knee Immobilizer - Left: On at all times Restrictions Weight Bearing Restrictions: Yes LLE Weight Bearing: Weight bearing as tolerated      Mobility Bed Mobility    General bed mobility comments: Pt declined OOB activities due to fatigue and just getting up with NT.   Transfers         General transfer comment: Pt declined OOB activities due to fatigue and just getting up with NT.          ADL Overall ADL's : Needs assistance/impaired Eating/Feeding: Independent;Sitting   Grooming: Set up;Sitting   Upper Body Bathing: Set up;Sitting       Upper Body Dressing : Set up;Sitting                     General ADL Comments: Pt returned to bed from bathroom as OT arrived to the room and declined OOB activities at this time due to fatigue.  Encouraged pt to participate with OT and PT tomorrow and pt agreed. Educated pt on fall prevention through environmental modifications at home including proper footwear, adequate lighting, and clear walking path in the house. Educated pt on AE available as well as compensatory techniques for LB ADLs.      Vision  Pt reports no change from baseline and has no apparent visual deficits.                    Perception Perception Perception Tested?: No   Praxis Praxis Praxis tested?: Within functional limits    Pertinent Vitals/Pain Pain Assessment: No/denies pain      Hand Dominance Right   Extremity/Trunk Assessment Upper Extremity Assessment Upper Extremity Assessment: Overall WFL for tasks assessed   Lower Extremity Assessment Lower Extremity Assessment: Defer to PT evaluation   Cervical / Trunk Assessment Cervical / Trunk Assessment: Normal   Communication Communication Communication: No difficulties   Cognition Arousal/Alertness: Awake/alert Behavior During Therapy: WFL for tasks assessed/performed Overall Cognitive Status: Within Functional Limits for tasks assessed                                Home Living Family/patient expects to be discharged to:: Private residence Living Arrangements: Other relatives (32 y.o. granddaughter not in school) Available Help at Discharge: Family;Available 24 hours/day Type of  Home: House Home Access: Stairs to enter [employer] of Steps: 3 Entrance Stairs-Rails: Right Home Layout: One level     Bathroom Shower/Tub: Tub/shower unit Shower/tub characteristics: Curtain Biochemist, clinical: Standard     Home Equipment: Environmental consultant - 4 wheels;Bedside commode          Prior Functioning/Environment Level of Independence: Independent        Comments: pt denies use of DME for ambulation    OT Diagnosis: Generalized weakness;Acute pain   OT Problem List: Decreased strength;Decreased range of  motion;Decreased activity tolerance;Impaired balance (sitting and/or standing);Decreased safety awareness;Decreased knowledge of use of DME or AE;Decreased knowledge of precautions;Pain   OT Treatment/Interventions: Self-care/ADL training;Therapeutic exercise;Energy conservation;DME and/or AE instruction;Therapeutic activities;Patient/family education;Balance training    OT Goals(Current goals can be found in the care plan section) Acute Rehab OT Goals Patient Stated Goal: to get rest and go home OT Goal Formulation: With patient Time For Goal Achievement: 10/16/13 Potential to Achieve Goals: Good ADL Goals Pt Will Perform Grooming: standing;with supervision Pt Will Perform Lower Body Dressing: with set-up;with supervision;with adaptive equipment;sit to/from stand Pt Will Transfer to Toilet: with supervision;ambulating;bedside commode Pt Will Perform Toileting - Clothing Manipulation and hygiene: with supervision;sit to/from stand Pt Will Perform Tub/Shower Transfer: Tub transfer;with supervision;ambulating;3 in 1;rolling walker  OT Frequency: Min 2X/week    End of Session Equipment Utilized During Treatment: Left knee immobilizer CPM Left Knee CPM Left Knee: Off   Activity Tolerance: Patient limited by fatigue Patient left: in bed;with call bell/phone within reach   Time: 8721-5872 OT Time Calculation (min): 18 min Charges:  OT General Charges $OT Visit: 1 Procedure OT Evaluation $Initial OT Evaluation Tier I: 1 Procedure OT Treatments $Self Care/Home Management : 8-22 mins  10-10-2013 1700  OT G-codes **NOT FOR INPATIENT CLASS**  Functional Assessment Tool Used clinical judgment  Functional Limitation Self care  Self Care Current Status [phone]) CJ  Self Care Goal Status ([id number]) CI    Juluis Rainier [phone], 5:37 PM  2013/11/01: added G-codes Cyndie Chime, OTR/L Occupational Therapist [phone] (pager)

## 2013-10-03 ENCOUNTER — Encounter (HOSPITAL_COMMUNITY): Payer: Self-pay | Admitting: Orthopedic Surgery

## 2013-10-03 DIAGNOSIS — M25569 Pain in unspecified knee: Secondary | ICD-10-CM | POA: Diagnosis not present

## 2013-10-03 DIAGNOSIS — M1710 Unilateral primary osteoarthritis, unspecified knee: Secondary | ICD-10-CM | POA: Diagnosis not present

## 2013-10-03 LAB — COMPREHENSIVE METABOLIC PANEL
ALBUMIN: 2.8 g/dL — AB (ref 3.5–5.2)
ALK PHOS: 78 U/L (ref 39–117)
ALT: 14 U/L (ref 0–35)
ANION GAP: 13 (ref 5–15)
AST: 40 U/L — ABNORMAL HIGH (ref 0–37)
BUN: 19 mg/dL (ref 6–23)
CALCIUM: 9.3 mg/dL (ref 8.4–10.5)
CO2: 27 mEq/L (ref 19–32)
Chloride: 101 mEq/L (ref 96–112)
Creatinine, Ser: 1.38 mg/dL — ABNORMAL HIGH (ref 0.50–1.10)
GFR calc Af Amer: 45 mL/min — ABNORMAL LOW (ref >90)
GFR calc non Af Amer: 39 mL/min — ABNORMAL LOW (ref >90)
Glucose, Bld: 136 mg/dL — ABNORMAL HIGH (ref 70–99)
POTASSIUM: 3.8 meq/L (ref 3.7–5.3)
Sodium: 141 mEq/L (ref 137–147)
TOTAL PROTEIN: 6.3 g/dL (ref 6.0–8.3)
Total Bilirubin: 0.6 mg/dL (ref 0.3–1.2)

## 2013-10-03 LAB — GLUCOSE, CAPILLARY
GLUCOSE-CAPILLARY: 142 mg/dL — AB (ref 70–99)
GLUCOSE-CAPILLARY: 125 mg/dL — AB (ref 70–99)
GLUCOSE-CAPILLARY: 179 mg/dL — AB (ref 70–99)
GLUCOSE-CAPILLARY: 161 mg/dL — AB (ref 70–99)

## 2013-10-03 LAB — CBC
HEMATOCRIT: 29 % — AB (ref 36.0–46.0)
Hemoglobin: 9.3 g/dL — ABNORMAL LOW (ref 12.0–15.0)
MCH: 28 pg (ref 26.0–34.0)
MCHC: 32.1 g/dL (ref 30.0–36.0)
MCV: 87.3 fL (ref 78.0–100.0)
Platelets: 195 10*3/uL (ref 150–400)
RBC: 3.32 MIL/uL — ABNORMAL LOW (ref 3.87–5.11)
RDW: 15.3 % (ref 11.5–15.5)
WBC: 7.6 10*3/uL (ref 4.0–10.5)

## 2013-10-03 MED ORDER — OXYCODONE-ACETAMINOPHEN 5-325 MG PO TABS: 1.0000 | ORAL_TABLET | ORAL | Status: DC | PRN

## 2013-10-03 MED ORDER — ASPIRIN EC 325 MG PO TBEC: 325.0000 mg | DELAYED_RELEASE_TABLET | Freq: Two times a day (BID) | ORAL | Status: DC

## 2013-10-03 MED ORDER — DOCUSATE SODIUM 100 MG PO CAPS: 100.0000 mg | ORAL_CAPSULE | Freq: Three times a day (TID) | ORAL | Status: DC | PRN

## 2013-10-03 MED ORDER — METHOCARBAMOL 500 MG PO TABS: 500.0000 mg | ORAL_TABLET | Freq: Three times a day (TID) | ORAL | Status: DC

## 2013-10-03 NOTE — Progress Notes (Signed)
Occupational Therapy Treatment Patient Details Name: Caitlin Morris MRN: [id number] DOB: 1950/11/09 Today's Date: 10/03/2013    History of present illness 63 y.o. female admitted to [facility] on 10/01/13 for elective L TKA.  Pt with significant PMHx of HTN, anxiety, depression, and DM.   OT comments  Pain limiting pt- RN aware           Precautions / Restrictions Precautions Precautions: Knee;Fall Required Braces or Orthoses: Knee Immobilizer - Left Knee Immobilizer - Left: On at all times Restrictions LLE Weight Bearing: Weight bearing as tolerated       Mobility Bed Mobility               General bed mobility comments: pt in chair  Transfers Overall transfer level: Needs assistance Equipment used: Rolling walker (2 wheeled) Transfers: Sit to/from Stand Sit to Stand: Min assist Stand pivot transfers: Min assist                ADL Overall ADL's : Needs assistance/impaired                     Lower Body Dressing: Moderate assistance;Sit to/from stand;Cueing for compensatory techniques Lower Body Dressing Details (indicate cue type and reason): pt not interested in AE. she states family will A Toilet Transfer: Minimal assistance;BSC;RW   Toileting- Clothing Manipulation and Hygiene: Minimal assistance;Sit to/from stand;Cueing for safety         General ADL Comments: pt wants to wait until she gets home to practice tub transfer. encouraged pt to practice with [facility] therapist                   General Comments  family will A pt with ADL activity as needed    Pertinent Vitals/ Pain       Pain Score: 9  Pain Location: l knee Pain Descriptors / Indicators: Aching Pain Intervention(s): Monitored during session;Premedicated before session         Frequency Min 2X/week     Progress Toward Goals  OT Goals(current goals can now be found in the care plan section)  Progress towards OT goals: Progressing toward goals     Plan Discharge plan  remains appropriate       End of Session     Activity Tolerance Patient limited by pain   Patient Left in chair;with family/visitor present   Nurse Communication Mobility status        Time: 1209-1224 OT Time Calculation (min): 15 min  Charges: OT General Charges $OT Visit: 1 Procedure OT Treatments $Self Care/Home Management : 8-22 mins  Alpha, Edwena Felty D 10/03/2013, 12:28 PM

## 2013-10-03 NOTE — Discharge Instructions (Signed)
Discharge Instructions after Total Knee Replacement   You will have a light dressing on your knee.  This is waterproof and can remain in place until your first visit with Dr. Lorri Frederick to shower with dressing on, pat dry Continue to elevate your leg frequently Pump your foot up and down 20 times per hour, every hour you are awake.  Apply ice to the knee 3 times per day for 30 minutes for the first 1 week until your knee is feeling comfortable again. Do not use heat.  Pain medicine has been prescribed for you.  Use your medicine as needed over the first 48 hours, and then you can begin to taper your use. You may take Extra Strength Tylenol or Tylenol only in place of the pain pills.  Take one  325mg  aspirin twice daily for 3 weeks post-operatively unless it upsets your stomach.   Please call [phone] during normal business hours or [phone] after hours for any problems. Including the following:  - excessive redness of the incisions - drainage for more than 4 days - fever of more than 101.5 F  *Please note that pain medications will not be refilled after hours or on weekends.

## 2013-10-03 NOTE — Progress Notes (Signed)
Physical Therapy Treatment Patient Details Name: Caitlin Morris MRN: [id number] DOB: 1950/02/11 Today's Date: 10/03/2013    History of Present Illness 63 y.o. female admitted to [facility] on 10/01/13 for elective L TKA.  Pt with significant PMHx of HTN, anxiety, depression, and DM.    PT Comments    Patient progressing slowly towards goals. She is eager to DC home but at this time from a PT standpoint, patient needs to be more mobile and able to stand without assistance. She also has stair to enter the home which will be attempted next session. She does have support at home on DC  Follow Up Recommendations  Home health PT;Supervision/Assistance - 24 hour     Equipment Recommendations  None recommended by PT    Recommendations for Other Services       Precautions / Restrictions Precautions Precautions: Knee;Fall Required Braces or Orthoses: Knee Immobilizer - Left Knee Immobilizer - Left: On at all times Restrictions LLE Weight Bearing: Weight bearing as tolerated    Mobility  Bed Mobility         Supine to sit: Min assist     General bed mobility comments: A for positioning  Transfers Overall transfer level: Needs assistance Equipment used: Rolling walker (2 wheeled) Transfers: Sit to/from Stand Sit to Stand: Min assist Stand pivot transfers: Min assist       General transfer comment: Min A to power up into standing and max cues for safe hand placement and technique  Ambulation/Gait Ambulation/Gait assistance: Min guard Ambulation Distance (Feet): 80 Feet Assistive device: Rolling walker (2 wheeled) Gait Pattern/deviations: Decreased stance time - right;Decreased step length - left;Step-to pattern     General Gait Details: Patient cued for gait sequence. Cued for increasing step length and RW positoniing   Stairs            Wheelchair Mobility    Modified Rankin (Stroke Patients Only)       Balance                                     Cognition Arousal/Alertness: Awake/alert Behavior During Therapy: WFL for tasks assessed/performed Overall Cognitive Status: Within Functional Limits for tasks assessed                      Exercises Total Joint Exercises Quad Sets: AROM;Left;10 reps Heel Slides: AAROM;Left;10 reps Hip ABduction/ADduction: AAROM;Left;10 reps Straight Leg Raises: AAROM;Left;10 reps    General Comments        Pertinent Vitals/Pain Pain Score: 7  Pain Location: L knee Pain Descriptors / Indicators: Aching Pain Intervention(s): Monitored during session    Home Living                      Prior Function            PT Goals (current goals can now be found in the care plan section) Progress towards PT goals: Progressing toward goals    Frequency  7X/week    PT Plan Current plan remains appropriate    Co-evaluation             End of Session Equipment Utilized During Treatment: Gait belt;Left knee immobilizer Activity Tolerance: Patient tolerated treatment well Patient left: in chair;with call bell/phone within reach     Time: 1102-1127 PT Time Calculation (min): 25 min  Charges:  $Gait Training: 8-22 mins $Therapeutic Exercise:  8-22 mins                    G Codes:      Jacqualyn Posey 10/03/2013, 1:41 PM 10/03/2013 Jacqualyn Posey PTA [phone] pager [phone] office

## 2013-10-03 NOTE — Progress Notes (Signed)
Physical Therapy Treatment Patient Details Name: Caitlin Morris MRN: [id number] DOB: 25-Sep-1950 Today's Date: 10/03/2013    History of Present Illness 63 y.o. female admitted to [facility] on 10/01/13 for elective L TKA.  Pt with significant PMHx of HTN, anxiety, depression, and DM.    PT Comments    Attempted stairs this afternoon and patient very unsteady. Would recommend another day of acute PT before discharging home as patient is very fatigued and she stated she had no energy (evident with stair attempt). Will attempt stairs again in AM and plan for DC after.   Follow Up Recommendations  Home health PT;Supervision/Assistance - 24 hour     Equipment Recommendations  None recommended by PT    Recommendations for Other Services       Precautions / Restrictions Precautions Precautions: Knee;Fall Required Braces or Orthoses: Knee Immobilizer - Left Knee Immobilizer - Left: On at all times Restrictions LLE Weight Bearing: Weight bearing as tolerated    Mobility  Bed Mobility         Supine to sit: Min assist     General bed mobility comments: Pt in recliner before and after session  Transfers Overall transfer level: Needs assistance Equipment used: Rolling walker (2 wheeled) Transfers: Sit to/from Stand Sit to Stand: Min assist Stand pivot transfers: Min assist       General transfer comment: Min A to power up into standing andcues for safe hand placement    Ambulation/Gait Ambulation/Gait assistance: Min guard Ambulation Distance (Feet): 30 Feet Assistive device: Rolling walker (2 wheeled) Gait Pattern/deviations: Step-to pattern;Decreased step length - left;Decreased stance time - right     General Gait Details: Patient limited by fatigue   Stairs Stairs: Yes Stairs assistance: Mod assist Stair Management: Backwards;With walker;No rails Number of Stairs: 3 General stair comments: Attempted 3 steps with RW backwards. Max cues for technique. Mod A for RW and  patient balance. Recommend +2 next attempt  Wheelchair Mobility    Modified Rankin (Stroke Patients Only)       Balance                                    Cognition Arousal/Alertness: Awake/alert Behavior During Therapy: WFL for tasks assessed/performed Overall Cognitive Status: Within Functional Limits for tasks assessed                      Exercises Total Joint Exercises Quad Sets: AROM;Left;10 reps Heel Slides: AAROM;Left;10 reps Hip ABduction/ADduction: AAROM;Left;10 reps Straight Leg Raises: AAROM;Left;10 reps    General Comments        Pertinent Vitals/Pain Pain Score: 6  Pain Location: L knee Pain Descriptors / Indicators: Aching Pain Intervention(s): Monitored during session    Home Living                      Prior Function            PT Goals (current goals can now be found in the care plan section) Progress towards PT goals: Progressing toward goals    Frequency  7X/week    PT Plan Current plan remains appropriate    Co-evaluation             End of Session Equipment Utilized During Treatment: Gait belt;Left knee immobilizer Activity Tolerance: Patient limited by fatigue Patient left: in chair;with call bell/phone within reach     Time: 1425-1450  PT Time Calculation (min): 25 min  Charges:  $Gait Training: 23-37 mins $Therapeutic Exercise: 8-22 mins                    G Codes:      Jacqualyn Posey 10/03/2013, 3:08 PM  10/03/2013 Jacqualyn Posey PTA [phone] pager [phone] office

## 2013-10-03 NOTE — Progress Notes (Signed)
   PATIENT ID: Caitlin Morris   2 Days Post-Op Procedure(s) (LRB): TOTAL KNEE ARTHROPLASTY (Left)  Subjective: Reports she is tired this am. 1/10 pain. Up with PT yesterday and tolerated that well. Hopes to go home today. No other complaints or concerns.  Objective:  Filed Vitals:   10/03/13 0628  BP: 126/66  Pulse: 62  Temp: 98.3 F (36.8 C)  Resp: 16     Awake,alert, orientated L LE dressing c/d/i Wiggles toes, full arom foot and ankle Distally NVI  Labs:   Recent Labs  10/02/13 0432 10/03/13 0640  HGB 10.2* 9.3*   Recent Labs  10/02/13 0432 10/03/13 0640  WBC 7.6 7.6  RBC 3.71* 3.32*  HCT 31.7* 29.0*  PLT 242 195   Recent Labs  10/02/13 0432  NA 141  K 3.5*  CL 100  CO2 29  BUN 17  CREATININE 1.14*  GLUCOSE 147*  CALCIUM 9.1    Assessment and Plan: 2 days s/p left TKA Continue with PT today Home health with [facility] set up and already has equipment Percocet for home pain control, robaxin script in chart Okay to d/c home today if cleared by PT Fu with Dr. Tamera Punt in 2 weeks  VTE proph: ASA 325mg  BID, scds

## 2013-10-03 NOTE — Progress Notes (Signed)
Orthopedic Tech Progress Note Patient Details:  Caitlin Morris 1950-02-25 [id number] On cpm LLE 0-73 at 8:25 pm  Patient ID: Caitlin Morris, female   DOB: 1951-01-13, 63 y.o.   MRN: [id number]   Braulio Bosch 10/03/2013, 8:23 PM

## 2013-10-04 DIAGNOSIS — M1710 Unilateral primary osteoarthritis, unspecified knee: Secondary | ICD-10-CM | POA: Diagnosis not present

## 2013-10-04 DIAGNOSIS — M25569 Pain in unspecified knee: Secondary | ICD-10-CM | POA: Diagnosis not present

## 2013-10-04 LAB — CBC
HCT: 27.8 % — ABNORMAL LOW (ref 36.0–46.0)
Hemoglobin: 8.8 g/dL — ABNORMAL LOW (ref 12.0–15.0)
MCH: 27.2 pg (ref 26.0–34.0)
MCHC: 31.7 g/dL (ref 30.0–36.0)
MCV: 85.8 fL (ref 78.0–100.0)
PLATELETS: 198 10*3/uL (ref 150–400)
RBC: 3.24 MIL/uL — AB (ref 3.87–5.11)
RDW: 15.3 % (ref 11.5–15.5)
WBC: 8 10*3/uL (ref 4.0–10.5)

## 2013-10-04 LAB — GLUCOSE, CAPILLARY
GLUCOSE-CAPILLARY: 157 mg/dL — AB (ref 70–99)
GLUCOSE-CAPILLARY: 129 mg/dL — AB (ref 70–99)

## 2013-10-04 NOTE — Progress Notes (Signed)
Physical Therapy Treatment Patient Details Name: Caitlin Morris MRN: [id number] DOB: 06/09/1950 Today's Date: 10/04/2013    History of Present Illness 63 y.o. female admitted to [facility] on 10/01/13 for elective L TKA.  Pt with significant PMHx of HTN, anxiety, depression, and DM.    PT Comments    Focus this session was stair training with patient and family. Patient completed stairs much safer this session than yesterday. Daughter feels safe taking patient home today. Will not see again this afternoon. Patient safe to D/C from a mobility standpoint based on progression towards goals set on PT eval.    Follow Up Recommendations  Home health PT;Supervision/Assistance - 24 hour     Equipment Recommendations  None recommended by PT    Recommendations for Other Services       Precautions / Restrictions Precautions Precautions: Knee;Fall Required Braces or Orthoses: Knee Immobilizer - Left Knee Immobilizer - Left: On at all times Restrictions LLE Weight Bearing: Weight bearing as tolerated    Mobility  Bed Mobility Overal bed mobility: Needs Assistance       Supine to sit: Min assist     General bed mobility comments: Very little assistance to get LLE off EOB  Transfers Overall transfer level: Needs assistance Equipment used: Rolling walker (2 wheeled) Transfers: Sit to/from Stand Sit to Stand: Min guard         General transfer comment: Max cues for hand placement. Patient continues to want to pull up on RW  Ambulation/Gait Ambulation/Gait assistance: Min guard Ambulation Distance (Feet): 40 Feet Assistive device: Rolling walker (2 wheeled) Gait Pattern/deviations: Step-through pattern;Decreased stride length   Gait velocity interpretation: Below normal speed for age/gender     Stairs Stairs: Yes Stairs assistance: Min assist Stair Management: Step to pattern;Backwards;With walker;No rails Number of Stairs: 4 General stair comments: Patient and daugther cued  for sequency and techique and given handout. A for RW positioning and support  Wheelchair Mobility    Modified Rankin (Stroke Patients Only)       Balance                                    Cognition Arousal/Alertness: Awake/alert Behavior During Therapy: WFL for tasks assessed/performed Overall Cognitive Status: Within Functional Limits for tasks assessed                      Exercises      General Comments        Pertinent Vitals/Pain Pain Assessment: 0-10 Pain Score: 4  Pain Location: L knee Pain Descriptors / Indicators: Aching Pain Intervention(s): Monitored during session    Home Living                      Prior Function            PT Goals (current goals can now be found in the care plan section) Progress towards PT goals: Progressing toward goals    Frequency  7X/week    PT Plan Current plan remains appropriate    Co-evaluation             End of Session Equipment Utilized During Treatment: Gait belt;Left knee immobilizer Activity Tolerance: Patient tolerated treatment well Patient left: in chair;with call bell/phone within reach     Time: 0932-0958 PT Time Calculation (min): 26 min  Charges:  $Gait Training: 23-37 mins  G Codes:      Jacqualyn Posey 10/04/2013, 10:20 AM 10/04/2013 Jacqualyn Posey PTA [phone] pager [phone] office

## 2013-10-04 NOTE — Discharge Summary (Signed)
Patient ID: Caitlin Morris MRN: [id number] DOB/AGE: 1951-01-19 63 y.o.  Admit date: 10/01/2013 Discharge date: 10/04/2013  Admission Diagnoses:  Active Problems:   Osteoarthritis of left knee   Discharge Diagnoses:  Same  Past Medical History  Diagnosis Date  . Hypertension   . Anxiety   . Depression   . Diabetes mellitus without complication   . Hyperlipidemia   . Obesity     Surgeries: Procedure(s): TOTAL KNEE ARTHROPLASTY on 10/01/2013   Consultants:    Discharged Condition: Improved  Hospital Course: Caitlin Morris is an 63 y.o. female who was admitted 10/01/2013 for operative treatment of left knee osteoarthritis. Patient has severe unremitting pain that affects sleep, daily activities, and work/hobbies. After pre-op clearance the patient was taken to the operating room on 10/01/2013 and underwent  Procedure(s): TOTAL KNEE ARTHROPLASTY.    Patient was given perioperative antibiotics: Anti-infectives   Start     Dose/Rate Route Frequency Ordered Stop   10/01/13 1600  ceFAZolin (ANCEF) IVPB 2 g/50 mL premix     2 g 100 mL/hr over 30 Minutes Intravenous Every 6 hours 10/01/13 1503 10/01/13 2255   10/01/13 0600  ceFAZolin (ANCEF) IVPB 2 g/50 mL premix     2 g 100 mL/hr over 30 Minutes Intravenous On call to O.R. 09/30/13 1318 10/01/13 1047       Patient was given sequential compression devices, early ambulation, and 325mg  BID to prevent DVT.  Patient benefited maximally from hospital stay and there were no complications.    Recent vital signs: Patient Vitals for the past 24 hrs:  BP Temp Temp src Pulse Resp SpO2  10/04/13 0554 118/69 mmHg 98.2 F (36.8 C) Oral 53 16 96 %  10/04/13 0400 - - - - 16 93 %  10/04/13 0000 - - - - 16 93 %  10/03/13 2120 119/64 mmHg 99.1 F (37.3 C) Oral 63 16 93 %  10/03/13 2000 - - - - 16 93 %  10/03/13 1500 101/56 mmHg 98 F (36.7 C) - 56 16 96 %     Recent laboratory studies:  Recent Labs  10/02/13 0432 10/03/13 0640  10/04/13 0540  WBC 7.6 7.6 8.0  HGB 10.2* 9.3* 8.8*  HCT 31.7* 29.0* 27.8*  PLT 242 195 198  NA 141 141  --   K 3.5* 3.8  --   CL 100 101  --   CO2 29 27  --   BUN 17 19  --   CREATININE 1.14* 1.38*  --   GLUCOSE 147* 136*  --   CALCIUM 9.1 9.3  --      Discharge Medications:     Medication List         amLODipine 10 MG tablet  Commonly known as:  NORVASC  Take 10 mg by mouth daily.     aspirin EC 325 MG tablet  Take 1 tablet (325 mg total) by mouth 2 (two) times daily.     atenolol 50 MG tablet  Commonly known as:  TENORMIN  Take 50 mg by mouth daily.     clonazePAM 1 MG tablet  Commonly known as:  KLONOPIN  Take 1 mg by mouth at bedtime.     cloNIDine 0.3 MG tablet  Commonly known as:  CATAPRES  Take 0.3-0.6 mg by mouth 2 (two) times daily. Takes 0.3mg  in the morning and 0.6mg  in the evening     docusate sodium 100 MG capsule  Commonly known as:  COLACE  Take 1  capsule (100 mg total) by mouth 3 (three) times daily as needed.     glipiZIDE 5 MG 24 hr tablet  Commonly known as:  GLUCOTROL XL  Take 5 mg by mouth daily with breakfast.     lisinopril-hydrochlorothiazide 20-25 MG per tablet  Commonly known as:  PRINZIDE,ZESTORETIC  Take 1 tablet by mouth daily.     methocarbamol 500 MG tablet  Commonly known as:  ROBAXIN  Take 1 tablet (500 mg total) by mouth 3 (three) times daily.     oxyCODONE-acetaminophen 5-325 MG per tablet  Commonly known as:  ROXICET  Take 1-2 tablets by mouth every 4 (four) hours as needed for severe pain.     pravastatin 20 MG tablet  Commonly known as:  PRAVACHOL  Take 20 mg by mouth daily.     sertraline 100 MG tablet  Commonly known as:  ZOLOFT  Take 100 mg by mouth daily.        Diagnostic Studies: Dg Chest 2 View  09/20/2013   CLINICAL DATA:  Preop knee replacement  EXAM: CHEST  2 VIEW  COMPARISON:  None.  FINDINGS: Lungs are clear.  No pleural effusion or pneumothorax.  The heart is normal in size.  Mild degenerative  changes of the visualized thoracolumbar spine.  IMPRESSION: No evidence of acute cardiopulmonary disease.   Electronically Signed   By: Julian Hy M.D.   On: 09/20/2013 16:17    Disposition: 01-Home or Self Care      Discharge Instructions   Call MD / Call 911    Complete by:  As directed   If you experience chest pain or shortness of breath, CALL 911 and be transported to the hospital emergency room.  If you develope a fever above 101 F, pus (white drainage) or increased drainage or redness at the wound, or calf pain, call your surgeon's office.     Constipation Prevention    Complete by:  As directed   Drink plenty of fluids.  Prune juice may be helpful.  You may use a stool softener, such as Colace (over the counter) 100 mg twice a day.  Use MiraLax (over the counter) for constipation as needed.     Diet - low sodium heart healthy    Complete by:  As directed      Increase activity slowly as tolerated    Complete by:  As directed            Follow-up Information   Follow up with Nita Sells, MD. Schedule an appointment as soon as possible for a visit in 2 weeks. (Someone from [facility] will contact you concerning start date for physical therapy.)    Specialty:  Orthopedic Surgery   Contact information:   [address] [place] [address] [phone]        Signed: Grier Mitts 10/04/2013, 11:42 AM

## 2013-10-04 NOTE — Progress Notes (Signed)
   PATIENT ID: Caitlin Morris   3 Days Post-Op Procedure(s) (LRB): TOTAL KNEE ARTHROPLASTY (Left)  Subjective: Doing well this am. 1/10 pain. A little tired. Ready to go home today.   Objective:  Filed Vitals:   10/04/13 0554  BP: 118/69  Pulse: 53  Temp: 98.2 F (36.8 C)  Resp: 16     Awake, alert, orientated L LE dressing c/d/i  Wiggles toes, full arom foot and ankle  Distally NVI   Labs:   Recent Labs  10/02/13 0432 10/03/13 0640 10/04/13 0540  HGB 10.2* 9.3* 8.8*   Recent Labs  10/03/13 0640 10/04/13 0540  WBC 7.6 8.0  RBC 3.32* 3.24*  HCT 29.0* 27.8*  PLT 195 198   Recent Labs  10/02/13 0432 10/03/13 0640  NA 141 141  K 3.5* 3.8  CL 100 101  CO2 29 27  BUN 17 19  CREATININE 1.14* 1.38*  GLUCOSE 147* 136*  CALCIUM 9.1 9.3    Assessment and Plan: 3 days s/p left TKA  Continue with PT today  Home health with [facility] set up and already has equipment  Percocet for home pain control, robaxin script in chart  Okay to d/c home today if cleared by PT  Fu with Dr. Tamera Punt in 2 weeks  VTE proph: ASA 325mg  BID, scds

## 2013-10-04 NOTE — Plan of Care (Signed)
Problem: Consults Goal: Diagnosis- Total Joint Replacement Outcome: Completed/Met Date Met:  10/04/13 Primary Total Knee Left

## 2013-10-24 NOTE — Progress Notes (Signed)
10/01/13 1628  PT G-Codes **NOT FOR INPATIENT CLASS**  Functional Assessment Tool Used assist level  Functional Limitation Mobility: Walking and moving around  Mobility: Walking and Moving Around Current Status (B8675) CK  Mobility: Walking and Moving Around Goal Status (Q4920) CI  late entry g-codes Caitlin Morris B. Jackson, Maxwell, DPT [phone]

## 2014-04-14 ENCOUNTER — Other Ambulatory Visit (HOSPITAL_COMMUNITY): Payer: Self-pay | Admitting: Internal Medicine

## 2014-04-14 DIAGNOSIS — Z1231 Encounter for screening mammogram for malignant neoplasm of breast: Secondary | ICD-10-CM

## 2014-04-18 ENCOUNTER — Ambulatory Visit (HOSPITAL_COMMUNITY)
Admission: RE | Admit: 2014-04-18 | Discharge: 2014-04-18 | Disposition: A | Discharge status: Home / Self Care | Payer: Medicare Other | Source: Ambulatory Visit | Attending: Internal Medicine | Admitting: Internal Medicine

## 2014-04-18 DIAGNOSIS — Z1231 Encounter for screening mammogram for malignant neoplasm of breast: Secondary | ICD-10-CM | POA: Diagnosis present

## 2015-03-10 ENCOUNTER — Other Ambulatory Visit: Payer: Self-pay

## 2015-03-10 DIAGNOSIS — Z1231 Encounter for screening mammogram for malignant neoplasm of breast: Secondary | ICD-10-CM

## 2015-04-21 ENCOUNTER — Ambulatory Visit: Payer: Medicare Other

## 2015-04-28 ENCOUNTER — Ambulatory Visit
Admission: RE | Admit: 2015-04-28 | Discharge: 2015-04-28 | Disposition: A | Discharge status: Home / Self Care | Payer: Medicare Other | Source: Ambulatory Visit

## 2015-04-28 DIAGNOSIS — Z1231 Encounter for screening mammogram for malignant neoplasm of breast: Secondary | ICD-10-CM

## 2015-06-17 ENCOUNTER — Other Ambulatory Visit (HOSPITAL_COMMUNITY): Payer: Self-pay | Admitting: Family Medicine

## 2015-06-17 ENCOUNTER — Other Ambulatory Visit (HOSPITAL_COMMUNITY): Payer: Self-pay | Admitting: Nephrology

## 2015-06-29 ENCOUNTER — Other Ambulatory Visit (HOSPITAL_COMMUNITY): Payer: Self-pay | Admitting: Nephrology

## 2015-07-16 ENCOUNTER — Ambulatory Visit (HOSPITAL_COMMUNITY): Payer: Medicare Other

## 2015-07-16 ENCOUNTER — Encounter (HOSPITAL_COMMUNITY): Admission: RE | Admit: 2015-07-16 | Payer: Medicare Other | Source: Ambulatory Visit

## 2015-08-05 ENCOUNTER — Encounter (HOSPITAL_COMMUNITY)
Admission: RE | Admit: 2015-08-05 | Discharge: 2015-08-05 | Disposition: A | Discharge status: Home / Self Care | Payer: Medicare Other | Source: Ambulatory Visit | Attending: Nephrology | Admitting: Nephrology

## 2015-08-05 MED ORDER — TECHNETIUM TC 99M SESTAMIBI GENERIC - CARDIOLITE
25.0000 | Freq: Once | INTRAVENOUS | Status: AC | PRN
  Administered 2015-08-05: 25 via INTRAVENOUS

## 2015-08-08 ENCOUNTER — Encounter (HOSPITAL_COMMUNITY): Payer: Medicare Other

## 2015-08-27 ENCOUNTER — Other Ambulatory Visit: Payer: Self-pay | Admitting: Surgery

## 2015-08-27 DIAGNOSIS — E349 Endocrine disorder, unspecified: Secondary | ICD-10-CM

## 2015-08-29 ENCOUNTER — Encounter (HOSPITAL_COMMUNITY): Payer: Self-pay | Admitting: *Deleted

## 2015-08-29 ENCOUNTER — Emergency Department (HOSPITAL_COMMUNITY): Payer: Medicare Other

## 2015-08-29 ENCOUNTER — Emergency Department (HOSPITAL_COMMUNITY)
Admission: EM | Admit: 2015-08-29 | Discharge: 2015-08-29 | Disposition: A | Discharge status: Home / Self Care | Payer: Medicare Other | Attending: Emergency Medicine | Admitting: Emergency Medicine

## 2015-08-29 DIAGNOSIS — I10 Essential (primary) hypertension: Secondary | ICD-10-CM | POA: Diagnosis not present

## 2015-08-29 DIAGNOSIS — E785 Hyperlipidemia, unspecified: Secondary | ICD-10-CM | POA: Diagnosis not present

## 2015-08-29 DIAGNOSIS — R531 Weakness: Secondary | ICD-10-CM | POA: Diagnosis present

## 2015-08-29 DIAGNOSIS — Z7984 Long term (current) use of oral hypoglycemic drugs: Secondary | ICD-10-CM | POA: Diagnosis not present

## 2015-08-29 DIAGNOSIS — E119 Type 2 diabetes mellitus without complications: Secondary | ICD-10-CM | POA: Insufficient documentation

## 2015-08-29 DIAGNOSIS — Z79899 Other long term (current) drug therapy: Secondary | ICD-10-CM | POA: Insufficient documentation

## 2015-08-29 LAB — BASIC METABOLIC PANEL
Anion gap: 11 (ref 5–15)
BUN: 44 mg/dL — AB (ref 6–20)
CO2: 27 mmol/L (ref 22–32)
CREATININE: 1.4 mg/dL — AB (ref 0.44–1.00)
Calcium: 10.4 mg/dL — ABNORMAL HIGH (ref 8.9–10.3)
Chloride: 101 mmol/L (ref 101–111)
GFR, EST AFRICAN AMERICAN: 44 mL/min — AB (ref >60)
GFR, EST NON AFRICAN AMERICAN: 38 mL/min — AB (ref >60)
Glucose, Bld: 212 mg/dL — ABNORMAL HIGH (ref 65–99)
POTASSIUM: 3 mmol/L — AB (ref 3.5–5.1)
SODIUM: 139 mmol/L (ref 135–145)

## 2015-08-29 LAB — CBC
HCT: 39.4 % (ref 36.0–46.0)
Hemoglobin: 12.7 g/dL (ref 12.0–15.0)
MCH: 27.4 pg (ref 26.0–34.0)
MCHC: 32.2 g/dL (ref 30.0–36.0)
MCV: 85.1 fL (ref 78.0–100.0)
PLATELETS: 306 10*3/uL (ref 150–400)
RBC: 4.63 MIL/uL (ref 3.87–5.11)
RDW: 14.1 % (ref 11.5–15.5)
WBC: 5.1 10*3/uL (ref 4.0–10.5)

## 2015-08-29 LAB — URINALYSIS, ROUTINE W REFLEX MICROSCOPIC
Bilirubin Urine: NEGATIVE
Glucose, UA: 100 mg/dL — AB
Hgb urine dipstick: NEGATIVE
KETONES UR: NEGATIVE mg/dL
NITRITE: NEGATIVE
PROTEIN: 30 mg/dL — AB
Specific Gravity, Urine: 1.017 (ref 1.005–1.030)
pH: 5.5 (ref 5.0–8.0)

## 2015-08-29 LAB — CBG MONITORING, ED: Glucose-Capillary: 221 mg/dL — ABNORMAL HIGH (ref 65–99)

## 2015-08-29 LAB — T4, FREE: FREE T4: 0.85 ng/dL (ref 0.61–1.12)

## 2015-08-29 LAB — URINE MICROSCOPIC-ADD ON
Bacteria, UA: NONE SEEN
RBC / HPF: NONE SEEN RBC/hpf (ref 0–5)

## 2015-08-29 LAB — TSH: TSH: 0.619 u[IU]/mL (ref 0.350–4.500)

## 2015-08-29 NOTE — Discharge Instructions (Signed)
As discussed, your evaluation today has been largely reassuring.  But, it is important that you monitor your condition carefully, and do not hesitate to return to the ED if you develop new, or concerning changes in your condition. ? ?Otherwise, please follow-up with your physician for appropriate ongoing care. ? ?

## 2015-08-29 NOTE — ED Provider Notes (Signed)
[facility] DEPT Provider Note   CSN: [id number] Arrival date & time: 08/29/15  1026  First Provider Contact:  None       History   Chief Complaint Chief Complaint  Patient presents with  . Weakness    HPI Caitlin Morris is a 65 y.o. female.  HPI Patient presents after an episode of weakness. Patient neurologist multiple medical issues, but it seems that today, she had an episode of unusual weakness. She was walking, felt weak, lightheaded, but not dizzy. No subsequent syncope, nor fall. There is no pain throughout the episode, and currently she only has generalized weakness She has a notable history of ongoing left leg dysesthesia, for approximately 2 years following surgery. She also has a history of recent evaluation of her thyroid gland, states that it has been described as abnormal.   Past Medical History:  Diagnosis Date  . Anxiety   . Depression   . Diabetes mellitus without complication ([place])   . Hyperlipidemia   . Hypertension   . Obesity     Patient Active Problem List   Diagnosis Date Noted  . Osteoarthritis of left knee 10/01/2013  . DENTAL PAIN 10/16/2009  . VAGINAL DISCHARGE 07/07/2009  . ARTHRALGIA 07/07/2009  . RENAL INSUFFICIENCY 06/21/2009  . DEGENERATIVE JOINT DISEASE, LEFT KNEE 05/21/2009  . KNEE PAIN, LEFT 05/05/2009  . WEIGHT GAIN 05/05/2009  . PROTEINURIA 05/05/2009  . BIPOLAR DISORDER UNSPECIFIED 04/04/2008  . HYPERTENSION 10/31/2007  . OSTEOPENIA 10/24/2005  . HYPERCALCEMIA 01/24/2005    Past Surgical History:  Procedure Laterality Date  . NO PAST SURGERIES    . TOTAL KNEE ARTHROPLASTY Left 10/01/2013   Procedure: TOTAL KNEE ARTHROPLASTY;  Surgeon: Nita Sells, MD;  Location: [facility];  Service: Orthopedics;  Laterality: Left;  Left total knee arthroplasty    OB History    No data available       Home Medications    Prior to Admission medications   Medication Sig Start Date End Date Taking? Authorizing  Provider  amLODipine (NORVASC) 10 MG tablet Take 10 mg by mouth daily.    Historical Provider, MD  aspirin EC 325 MG tablet Take 1 tablet (325 mg total) by mouth 2 (two) times daily. 10/03/13   Grier Mitts, PA-C  atenolol (TENORMIN) 50 MG tablet Take 50 mg by mouth daily.    Historical Provider, MD  clonazePAM (KLONOPIN) 1 MG tablet Take 1 mg by mouth at bedtime.     Historical Provider, MD  cloNIDine (CATAPRES) 0.3 MG tablet Take 0.3-0.6 mg by mouth 2 (two) times daily. Takes 0.3mg  in the morning and 0.6mg  in the evening    Historical Provider, MD  docusate sodium (COLACE) 100 MG capsule Take 1 capsule (100 mg total) by mouth 3 (three) times daily as needed. 10/03/13   Danielle Laliberte, PA-C  glipiZIDE (GLUCOTROL XL) 5 MG 24 hr tablet Take 5 mg by mouth daily with breakfast.    Historical Provider, MD  lisinopril-hydrochlorothiazide (PRINZIDE,ZESTORETIC) 20-25 MG per tablet Take 1 tablet by mouth daily.    Historical Provider, MD  methocarbamol (ROBAXIN) 500 MG tablet Take 1 tablet (500 mg total) by mouth 3 (three) times daily. 10/03/13   Grier Mitts, PA-C  oxyCODONE-acetaminophen (ROXICET) 5-325 MG per tablet Take 1-2 tablets by mouth every 4 (four) hours as needed for severe pain. 10/03/13   Grier Mitts, PA-C  pravastatin (PRAVACHOL) 20 MG tablet Take 20 mg by mouth daily.    Historical Provider, MD  sertraline (ZOLOFT) 100  MG tablet Take 100 mg by mouth daily.    Historical Provider, MD    Family History No family history on file.  Social History Social History  Substance Use Topics  . Smoking status: Never Smoker  . Smokeless tobacco: Never Used  . Alcohol use No     Allergies   Codeine   Review of Systems Review of Systems  Constitutional:       Per HPI, otherwise negative  HENT:       Per HPI, otherwise negative  Respiratory:       Per HPI, otherwise negative  Cardiovascular:       Per HPI, otherwise negative  Gastrointestinal: Negative for vomiting.   Endocrine:       Negative aside from HPI  Genitourinary:       Neg aside from HPI   Musculoskeletal:       Per HPI, otherwise negative  Skin: Negative.   Neurological: Positive for weakness and numbness. Negative for syncope.     Physical Exam Updated Vital Signs BP 168/86 (BP Location: Right Arm)   Pulse (!) 40   Temp 98.2 F (36.8 C) (Oral)   Resp 14   Ht 5\' 5"  (1.651 m)   Wt 180 lb (81.6 kg)   SpO2 100%   BMI 29.95 kg/m   Physical Exam  Constitutional: She is oriented to person, place, and time. No distress.  Week looking elderly female moving all extremity spontaneously, speaking clearly  HENT:  Head: Normocephalic and atraumatic.  Eyes: Conjunctivae and EOM are normal.  Cardiovascular: Normal rate and regular rhythm.   Pulmonary/Chest: Effort normal and breath sounds normal. No stridor. No respiratory distress.  Abdominal: She exhibits no distension. There is no tenderness.  Musculoskeletal: She exhibits no edema or tenderness.  Patient describes pain on the lateral aspect of the left foot, left leg, but there is no deformity, no appreciable loss of function in the knee, ankle, left.  Neurological: She is alert and oriented to person, place, and time. No cranial nerve deficit.  Skin: Skin is warm and dry.  Psychiatric: She has a normal mood and affect.  Nursing note and vitals reviewed.    ED Treatments / Results  Labs (all labs ordered are listed, but only abnormal results are displayed) Labs Reviewed  BASIC METABOLIC PANEL - Abnormal; Notable for the following:       Result Value   Potassium 3.0 (*)    Glucose, Bld 212 (*)    BUN 44 (*)    Creatinine, Ser 1.40 (*)    Calcium 10.4 (*)    GFR calc non Af Amer 38 (*)    GFR calc Af Amer 44 (*)    All other components within normal limits  URINALYSIS, ROUTINE W REFLEX MICROSCOPIC (NOT AT [facility]) - Abnormal; Notable for the following:    Glucose, UA 100 (*)    Protein, ur 30 (*)    Leukocytes, UA SMALL (*)     All other components within normal limits  URINE MICROSCOPIC-ADD ON - Abnormal; Notable for the following:    Squamous Epithelial / LPF 0-5 (*)    All other components within normal limits  CBG MONITORING, ED - Abnormal; Notable for the following:    Glucose-Capillary 221 (*)    All other components within normal limits  CBC  TSH  T4, FREE  CBG MONITORING, ED    EKG  EKG Interpretation  Date/Time:  Saturday August 29 2015 11:05:56 EDT Ventricular Rate:  42 PR Interval:    QRS Duration: 108 QT Interval:  499 QTC Calculation: 417 R Axis:   -30 Text Interpretation:  Sinus bradycardia Prolonged PR interval LVH with secondary repolarization abnormality T wave abnormality No significant change since last tracing Abnormal ekg Confirmed by Carmin Muskrat  MD [phone]) on 08/29/2015 11:24:03 AM       Radiology Dg Chest 2 View  Result Date: 08/29/2015 CLINICAL DATA:  Wheezing. EXAM: CHEST  2 VIEW COMPARISON:  None. FINDINGS: The heart size and mediastinal contours are within normal limits. Both lungs are clear. The visualized skeletal structures are unremarkable. IMPRESSION: No active cardiopulmonary disease. Electronically Signed   By: Kerby Moors M.D.   On: 08/29/2015 15:22    Procedures Procedures (including critical care time)  Medications Ordered in ED Medications - No data to display   Initial Impression / Assessment and Plan / ED Course  I have reviewed the triage vital signs and the nursing notes.  Pertinent labs & imaging results that were available during my care of the patient were reviewed by me and considered in my medical decision making (see chart for details).  Clinical Course   Patient in no distress. All results discussed the patient and family members. She is in no distress, hemodynamically stable, will follow-up with primary care in 2 days.  Final Clinical Impressions(s) / ED Diagnoses  Patient was also generalized weakness. Here she is awake and  alert, neurologically intact, hemodynamically stable. She acknowledges a history of hypothyroidism, for which she is currently being evaluated, has not started medication.    Carmin Muskrat, MD 08/29/15 [phone]

## 2015-08-29 NOTE — ED Triage Notes (Signed)
Pt reports weakness since Thursday, with her L leg feeling like a "block, it was so cold."  Pt denies any cp, SOB or dizziness at this time.

## 2015-09-02 ENCOUNTER — Ambulatory Visit
Admission: RE | Admit: 2015-09-02 | Discharge: 2015-09-02 | Disposition: A | Discharge status: Home / Self Care | Payer: Medicare Other | Source: Ambulatory Visit | Attending: Surgery | Admitting: Surgery

## 2015-09-02 DIAGNOSIS — E349 Endocrine disorder, unspecified: Secondary | ICD-10-CM

## 2015-10-05 ENCOUNTER — Ambulatory Visit: Payer: Self-pay | Admitting: Surgery

## 2015-10-15 ENCOUNTER — Encounter (HOSPITAL_COMMUNITY): Payer: Self-pay

## 2015-10-15 ENCOUNTER — Emergency Department (HOSPITAL_COMMUNITY): Payer: Medicare Other

## 2015-10-15 ENCOUNTER — Emergency Department (HOSPITAL_COMMUNITY)
Admission: EM | Admit: 2015-10-15 | Discharge: 2015-10-15 | Disposition: A | Discharge status: Home / Self Care | Payer: Medicare Other | Attending: Emergency Medicine | Admitting: Emergency Medicine

## 2015-10-15 DIAGNOSIS — E119 Type 2 diabetes mellitus without complications: Secondary | ICD-10-CM | POA: Diagnosis not present

## 2015-10-15 DIAGNOSIS — S82892A Other fracture of left lower leg, initial encounter for closed fracture: Secondary | ICD-10-CM

## 2015-10-15 DIAGNOSIS — S8252XA Displaced fracture of medial malleolus of left tibia, initial encounter for closed fracture: Secondary | ICD-10-CM | POA: Insufficient documentation

## 2015-10-15 DIAGNOSIS — Y999 Unspecified external cause status: Secondary | ICD-10-CM | POA: Diagnosis not present

## 2015-10-15 DIAGNOSIS — X58XXXA Exposure to other specified factors, initial encounter: Secondary | ICD-10-CM | POA: Insufficient documentation

## 2015-10-15 DIAGNOSIS — S99912A Unspecified injury of left ankle, initial encounter: Secondary | ICD-10-CM | POA: Diagnosis present

## 2015-10-15 DIAGNOSIS — Y929 Unspecified place or not applicable: Secondary | ICD-10-CM | POA: Insufficient documentation

## 2015-10-15 DIAGNOSIS — Z79899 Other long term (current) drug therapy: Secondary | ICD-10-CM | POA: Diagnosis not present

## 2015-10-15 DIAGNOSIS — Z7984 Long term (current) use of oral hypoglycemic drugs: Secondary | ICD-10-CM | POA: Insufficient documentation

## 2015-10-15 DIAGNOSIS — Y939 Activity, unspecified: Secondary | ICD-10-CM | POA: Diagnosis not present

## 2015-10-15 DIAGNOSIS — I10 Essential (primary) hypertension: Secondary | ICD-10-CM | POA: Diagnosis not present

## 2015-10-15 DIAGNOSIS — S82899A Other fracture of unspecified lower leg, initial encounter for closed fracture: Secondary | ICD-10-CM | POA: Diagnosis present

## 2015-10-15 MED ORDER — TRAMADOL HCL 50 MG PO TABS
50.0000 mg | ORAL_TABLET | Freq: Once | ORAL | Status: AC
  Administered 2015-10-15: 50 mg via ORAL
  Filled 2015-10-15: qty 1

## 2015-10-15 MED ORDER — OXYCODONE-ACETAMINOPHEN 5-325 MG PO TABS
1.0000 | ORAL_TABLET | ORAL | Status: DC | PRN
  Administered 2015-10-15: 1 via ORAL
  Filled 2015-10-15: qty 1

## 2015-10-15 MED ORDER — TRAMADOL HCL 50 MG PO TABS: 50.0000 mg | ORAL_TABLET | Freq: Four times a day (QID) | ORAL | 0 refills | Status: DC | PRN

## 2015-10-15 NOTE — ED Notes (Signed)
NP at beside

## 2015-10-15 NOTE — ED Provider Notes (Signed)
[facility] DEPT Provider Note   CSN: [id number] Arrival date & time: 10/15/15  1837  By signing my name below, I, Gwenlyn Fudge, attest that this documentation has been prepared under the direction and in the presence of Junius Creamer, NP. Electronically Signed: Gwenlyn Fudge, ED Scribe. 10/15/15. 9:19 PM.  History   Chief Complaint Chief Complaint  Patient presents with  . Foot Swelling    x5 days   The history is provided by the patient. No language interpreter was used.   HPI Comments: Caitlin Morris is a 65 y.o. female with PMHx of DM, HLD, and HTN who presents to the Emergency Department complaining of gradual onset, constant left foot swelling onset 4 days. She states swelling is not better in the mornings. She has been trying warm soaks and creams with no relief to symptoms. She denies injury to left foot. Pt saw her PCP on 10/13/2015 who took X-Rays and prescribed medications to reduce swelling, but she states he did not send the prescriptions to her.   Past Medical History:  Diagnosis Date  . Anxiety   . Depression   . Diabetes mellitus without complication ([place])   . Hyperlipidemia   . Hypertension   . Obesity     Patient Active Problem List   Diagnosis Date Noted  . Osteoarthritis of left knee 10/01/2013  . DENTAL PAIN 10/16/2009  . VAGINAL DISCHARGE 07/07/2009  . ARTHRALGIA 07/07/2009  . RENAL INSUFFICIENCY 06/21/2009  . DEGENERATIVE JOINT DISEASE, LEFT KNEE 05/21/2009  . KNEE PAIN, LEFT 05/05/2009  . WEIGHT GAIN 05/05/2009  . PROTEINURIA 05/05/2009  . BIPOLAR DISORDER UNSPECIFIED 04/04/2008  . HYPERTENSION 10/31/2007  . OSTEOPENIA 10/24/2005  . HYPERCALCEMIA 01/24/2005    Past Surgical History:  Procedure Laterality Date  . NO PAST SURGERIES    . TOTAL KNEE ARTHROPLASTY Left 10/01/2013   Procedure: TOTAL KNEE ARTHROPLASTY;  Surgeon: Nita Sells, MD;  Location: [facility];  Service: Orthopedics;  Laterality: Left;  Left total knee arthroplasty     OB History    No data available       Home Medications    Prior to Admission medications   Medication Sig Start Date End Date Taking? Authorizing Provider  amLODipine (NORVASC) 10 MG tablet Take 10 mg by mouth daily.    Historical Provider, MD  atenolol (TENORMIN) 50 MG tablet Take 50 mg by mouth daily.    Historical Provider, MD  clonazePAM (KLONOPIN) 1 MG tablet Take 1 mg by mouth at bedtime.     Historical Provider, MD  cloNIDine (CATAPRES) 0.3 MG tablet Take 0.3-0.6 mg by mouth 2 (two) times daily. 0.3mg  AM and 0.6mg  PM    Historical Provider, MD  glimepiride (AMARYL) 4 MG tablet Take 4 mg by mouth 2 (two) times daily. 08/23/15   Historical Provider, MD  latanoprost (XALATAN) 0.005 % ophthalmic solution Place 1 drop into both eyes at bedtime. 08/03/15   Historical Provider, MD  lisinopril-hydrochlorothiazide (PRINZIDE,ZESTORETIC) 20-25 MG per tablet Take 1 tablet by mouth daily.    Historical Provider, MD  pravastatin (PRAVACHOL) 80 MG tablet Take 80 mg by mouth at bedtime. 07/10/15   Historical Provider, MD  sertraline (ZOLOFT) 100 MG tablet Take 100 mg by mouth daily.    Historical Provider, MD    Family History History reviewed. No pertinent family history.  Social History Social History  Substance Use Topics  . Smoking status: Never Smoker  . Smokeless tobacco: Never Used  . Alcohol use No  Allergies   Codeine  Review of Systems Review of Systems  Constitutional: Negative for fever.  Musculoskeletal: Positive for arthralgias and joint swelling.  All other systems reviewed and are negative.   Physical Exam Updated Vital Signs BP 150/88 (BP Location: Left Arm)   Pulse (!) 52   Temp 98.1 F (36.7 C) (Oral)   Resp 13   Ht 5\' 3"  (1.6 m)   Wt 170 lb (77.1 kg)   SpO2 97%   BMI 30.11 kg/m   Physical Exam  Constitutional: She appears well-developed and well-nourished. She is active. No distress.  HENT:  Head: Normocephalic and atraumatic.  Eyes:  Conjunctivae are normal.  Cardiovascular: Normal rate.   Pulmonary/Chest: Effort normal. No respiratory distress.  Musculoskeletal: She exhibits edema.  Neurological: She is alert.  Skin: Skin is warm and dry.  Psychiatric: She has a normal mood and affect. Her behavior is normal.  Nursing note and vitals reviewed.  ED Treatments / Results  DIAGNOSTIC STUDIES: Oxygen Saturation is 97% on RA, adequate by my interpretation.    COORDINATION OF CARE: 9:12 PM Discussed treatment plan with pt at bedside which includes orthopedic splint and pt agreed to plan.   Labs (all labs ordered are listed, but only abnormal results are displayed) Labs Reviewed - No data to display  EKG  EKG Interpretation None       Radiology Dg Ankle Complete Left  Result Date: 10/15/2015 CLINICAL DATA:  Diffuse ankle and foot swelling for 5 days. No known injury. EXAM: LEFT ANKLE COMPLETE - 3+ VIEW; LEFT FOOT - COMPLETE 3+ VIEW COMPARISON:  None. FINDINGS: Left ankle: The ankle mortise is maintained. Mild degenerative changes. No acute fracture. Probable remote avulsion fracture involving the medial malleolus. No definite ankle joint effusion. The subtalar joints appear normal. Calcaneal spurring changes are noted. Left foot: Midfoot and forefoot degenerative changes. No fracture or destructive bony changes. IMPRESSION: Degenerative changes but no acute bony findings or destructive bony changes. Diffuse soft tissue swelling/edema. Electronically Signed   By: Marijo Sanes M.D.   On: 10/15/2015 20:52   Dg Foot Complete Left  Result Date: 10/15/2015 CLINICAL DATA:  Diffuse ankle and foot swelling for 5 days. No known injury. EXAM: LEFT ANKLE COMPLETE - 3+ VIEW; LEFT FOOT - COMPLETE 3+ VIEW COMPARISON:  None. FINDINGS: Left ankle: The ankle mortise is maintained. Mild degenerative changes. No acute fracture. Probable remote avulsion fracture involving the medial malleolus. No definite ankle joint effusion. The  subtalar joints appear normal. Calcaneal spurring changes are noted. Left foot: Midfoot and forefoot degenerative changes. No fracture or destructive bony changes. IMPRESSION: Degenerative changes but no acute bony findings or destructive bony changes. Diffuse soft tissue swelling/edema. Electronically Signed   By: Marijo Sanes M.D.   On: 10/15/2015 20:52    Procedures Procedures (including critical care time)  Medications Ordered in ED Medications  oxyCODONE-acetaminophen (PERCOCET/ROXICET) 5-325 MG per tablet 1 tablet (1 tablet Oral Given 10/15/15 1949)     Initial Impression / Assessment and Plan / ED Course  I have reviewed the triage vital signs and the nursing notes.  Pertinent labs & imaging results that were available during my care of the patient were reviewed by me and considered in my medical decision making (see chart for details).  Clinical Course   I personally performed the services described in this documentation, which was scribed in my presence. The recorded information has been reviewed and is accurate. Patient has been placed in ASO and provided with  crutches.  She is ambulating well with crutches instructions.  She been provided with a prescription for Ultram for pain control.  She's been given a referral to orthopedic surgery as well as follow-up with her primary care physician     Final Clinical Impressions(s) / ED Diagnoses   Final diagnoses:  None    New Prescriptions New Prescriptions   No medications on file     Junius Creamer, NP 10/15/15 [place], DO 10/16/15 [id number]

## 2015-10-15 NOTE — ED Triage Notes (Signed)
PT C/O LEFT FOOT SWELLING AND PAIN SINCE Sunday MORNING. PT DENIES INJURY.

## 2015-10-15 NOTE — ED Notes (Signed)
Pt takes BP medication in the morning and is aware of her HTN.

## 2015-11-06 ENCOUNTER — Encounter (HOSPITAL_COMMUNITY): Payer: Self-pay

## 2015-11-06 ENCOUNTER — Encounter (HOSPITAL_COMMUNITY)
Admission: RE | Admit: 2015-11-06 | Discharge: 2015-11-06 | Disposition: A | Discharge status: Home / Self Care | Payer: Medicare Other | Source: Ambulatory Visit | Attending: Surgery | Admitting: Surgery

## 2015-11-06 DIAGNOSIS — E210 Primary hyperparathyroidism: Secondary | ICD-10-CM | POA: Diagnosis present

## 2015-11-06 DIAGNOSIS — E1165 Type 2 diabetes mellitus with hyperglycemia: Secondary | ICD-10-CM | POA: Diagnosis present

## 2015-11-06 DIAGNOSIS — I248 Other forms of acute ischemic heart disease: Secondary | ICD-10-CM | POA: Diagnosis present

## 2015-11-06 DIAGNOSIS — E1122 Type 2 diabetes mellitus with diabetic chronic kidney disease: Secondary | ICD-10-CM | POA: Diagnosis present

## 2015-11-06 DIAGNOSIS — Z87891 Personal history of nicotine dependence: Secondary | ICD-10-CM | POA: Diagnosis not present

## 2015-11-06 DIAGNOSIS — E1142 Type 2 diabetes mellitus with diabetic polyneuropathy: Secondary | ICD-10-CM | POA: Diagnosis present

## 2015-11-06 DIAGNOSIS — I129 Hypertensive chronic kidney disease with stage 1 through stage 4 chronic kidney disease, or unspecified chronic kidney disease: Secondary | ICD-10-CM | POA: Diagnosis present

## 2015-11-06 DIAGNOSIS — E785 Hyperlipidemia, unspecified: Secondary | ICD-10-CM | POA: Diagnosis present

## 2015-11-06 DIAGNOSIS — Z7984 Long term (current) use of oral hypoglycemic drugs: Secondary | ICD-10-CM | POA: Diagnosis not present

## 2015-11-06 DIAGNOSIS — N183 Chronic kidney disease, stage 3 (moderate): Secondary | ICD-10-CM | POA: Diagnosis present

## 2015-11-06 DIAGNOSIS — M8580 Other specified disorders of bone density and structure, unspecified site: Secondary | ICD-10-CM | POA: Diagnosis present

## 2015-11-06 DIAGNOSIS — Z6832 Body mass index (BMI) 32.0-32.9, adult: Secondary | ICD-10-CM | POA: Diagnosis not present

## 2015-11-06 DIAGNOSIS — E669 Obesity, unspecified: Secondary | ICD-10-CM | POA: Diagnosis present

## 2015-11-06 DIAGNOSIS — Z79899 Other long term (current) drug therapy: Secondary | ICD-10-CM | POA: Diagnosis not present

## 2015-11-06 DIAGNOSIS — E559 Vitamin D deficiency, unspecified: Secondary | ICD-10-CM | POA: Diagnosis present

## 2015-11-06 DIAGNOSIS — R001 Bradycardia, unspecified: Secondary | ICD-10-CM | POA: Diagnosis present

## 2015-11-06 DIAGNOSIS — Z5309 Procedure and treatment not carried out because of other contraindication: Secondary | ICD-10-CM | POA: Diagnosis present

## 2015-11-06 DIAGNOSIS — I493 Ventricular premature depolarization: Secondary | ICD-10-CM | POA: Diagnosis present

## 2015-11-06 HISTORY — DX: Unspecified osteoarthritis, unspecified site: M19.90

## 2015-11-06 HISTORY — DX: Cerebral infarction, unspecified: I63.9

## 2015-11-06 LAB — BASIC METABOLIC PANEL
Anion gap: 12 (ref 5–15)
BUN: 38 mg/dL — AB (ref 6–20)
CHLORIDE: 98 mmol/L — AB (ref 101–111)
CO2: 26 mmol/L (ref 22–32)
CREATININE: 1.68 mg/dL — AB (ref 0.44–1.00)
Calcium: 10.5 mg/dL — ABNORMAL HIGH (ref 8.9–10.3)
GFR calc non Af Amer: 30 mL/min — ABNORMAL LOW (ref >60)
GFR, EST AFRICAN AMERICAN: 35 mL/min — AB (ref >60)
GLUCOSE: 528 mg/dL — AB (ref 65–99)
Potassium: 4.1 mmol/L (ref 3.5–5.1)
Sodium: 136 mmol/L (ref 135–145)

## 2015-11-06 LAB — CBC
HCT: 38.1 % (ref 36.0–46.0)
Hemoglobin: 11.9 g/dL — ABNORMAL LOW (ref 12.0–15.0)
MCH: 26.9 pg (ref 26.0–34.0)
MCHC: 31.2 g/dL (ref 30.0–36.0)
MCV: 86.2 fL (ref 78.0–100.0)
PLATELETS: 232 10*3/uL (ref 150–400)
RBC: 4.42 MIL/uL (ref 3.87–5.11)
RDW: 14.2 % (ref 11.5–15.5)
WBC: 4.7 10*3/uL (ref 4.0–10.5)

## 2015-11-06 NOTE — Progress Notes (Signed)
BMP results in epic per PAT visit 11/06/2015 sent to Dr Harlow Asa

## 2015-11-06 NOTE — Patient Instructions (Addendum)
Caitlin Morris  11/06/2015   Your procedure is scheduled on: Monday November 09, 2015  Report to [facility] Main  Entrance take [place]  elevators to 3rd floor to  [place] at 11:45 AM.  Call this number if you have problems the morning of surgery [phone]   Remember: ONLY 1 PERSON MAY GO WITH YOU TO SHORT STAY TO GET  READY MORNING OF [place].  Do not eat food After Midnight but may take clear liquid diet till 7:45 am day of surgery then nothing by mouth.      CLEAR LIQUID DIET   Foods Allowed                                                                     Foods Excluded  Coffee and tea, regular and decaf                             liquids that you cannot  Plain Jell-O in any flavor                                             see through such as: Fruit ices (not with fruit pulp)                                     milk, soups, orange juice  Iced Popsicles                                    All solid food Carbonated beverages, regular and diet                                    Cranberry, grape and apple juices Sports drinks like Gatorade Lightly seasoned clear broth or consume(fat free) Sugar, honey syrup  Sample Menu Breakfast                                Lunch                                     Supper Cranberry juice                    Beef broth                            Chicken broth Jell-O                                     Grape juice  Apple juice Coffee or tea                        Jell-O                                      Popsicle                                                Coffee or tea                        Coffee or tea  _____________________________________________________________________     Take these medicines the morning of surgery with A SIP OF WATER: Amlodipine, Allopurinol, Atenolol, Clonidine, Gabapentin, May take hydrocodone-Acetaminophen if needed; May use eye drops if  needed  DO NOT TAKE ANY DIABETIC MEDICATIONS DAY OF YOUR SURGERY                               You may not have any metal on your body including hair pins and              piercings  Do not wear jewelry, make-up, lotions, powders or perfumes, deodorant             Do not wear nail polish.  Do not shave  48 hours prior to surgery.              Do not bring valuables to the hospital. [place].  Contacts, dentures or bridgework may not be worn into surgery.     Patients discharged the day of surgery will not be allowed to drive home.  Name and phone number of your driver:Caitlin Morris (cousin)   _____________________________________________________________________             [facility] - Preparing for Surgery Before surgery, you can play an important role.  Because skin is not sterile, your skin needs to be as free of germs as possible.  You can reduce the number of germs on your skin by washing with CHG (chlorahexidine gluconate) soap before surgery.  CHG is an antiseptic cleaner which kills germs and bonds with the skin to continue killing germs even after washing. Please DO NOT use if you have an allergy to CHG or antibacterial soaps.  If your skin becomes reddened/irritated stop using the CHG and inform your nurse when you arrive at Short Stay. Do not shave (including legs and underarms) for at least 48 hours prior to the first CHG shower.  You may shave your face/neck. Please follow these instructions carefully:  1.  Shower with CHG Soap the night before surgery and the  morning of Surgery.  2.  If you choose to wash your hair, wash your hair first as usual with your  normal  shampoo.  3.  After you shampoo, rinse your hair and body thoroughly to remove the  shampoo.                           4.  Use CHG as  you would any other liquid soap.  You can apply chg directly  to the skin and wash                       Gently with a scrungie or  clean washcloth.  5.  Apply the CHG Soap to your body ONLY FROM THE NECK DOWN.   Do not use on face/ open                           Wound or open sores. Avoid contact with eyes, ears mouth and genitals (private parts).                       Wash face,  Genitals (private parts) with your normal soap.             6.  Wash thoroughly, paying special attention to the area where your surgery  will be performed.  7.  Thoroughly rinse your body with warm water from the neck down.  8.  DO NOT shower/wash with your normal soap after using and rinsing off  the CHG Soap.                9.  Pat yourself dry with a clean towel.            10.  Wear clean pajamas.            11.  Place clean sheets on your bed the night of your first shower and do not  sleep with pets. Day of Surgery : Do not apply any lotions/deodorants the morning of surgery.  Please wear clean clothes to the hospital/surgery center.  FAILURE TO FOLLOW THESE INSTRUCTIONS MAY RESULT IN THE CANCELLATION OF YOUR SURGERY PATIENT SIGNATURE_________________________________  NURSE SIGNATURE__________________________________  ________________________________________________________________________

## 2015-11-06 NOTE — Progress Notes (Addendum)
CRITICAL VALUE ALERT  Critical value received:  11/06/2015 glucose 528  Date of notification:  11/06/2015  Time of notification:  1:00 pm   Critical value read back:yes spoke with Ovid Curd in [facility]   Nurse who received alert: Nash Dimmer RN BSN   MD notified (1st page): Dr Cloyde Reams with [facility] triage  Time of first page:  1:02pm     Responding MD:  Spoke with [facility] with Dr Harlow Asa [phone] states [facility] will notify pts PCP of results; Anesthesia notified of results/Dr Deatra Canter

## 2015-11-06 NOTE — Progress Notes (Signed)
Dr Leonor Liv Fitzgerald/anesthesia reviewed EKGs/epic 09/20/2013 and 08/29/2015. No orders given. Anesthesia to see pt day of surgery.

## 2015-11-08 ENCOUNTER — Encounter (HOSPITAL_COMMUNITY): Payer: Self-pay | Admitting: Surgery

## 2015-11-08 DIAGNOSIS — E210 Primary hyperparathyroidism: Secondary | ICD-10-CM | POA: Diagnosis present

## 2015-11-08 LAB — HEMOGLOBIN A1C
Hgb A1c MFr Bld: 11.5 % — ABNORMAL HIGH (ref 4.8–5.6)
MEAN PLASMA GLUCOSE: 283 mg/dL

## 2015-11-08 NOTE — H&P (Signed)
General Surgery [facility]  Macy Minten 08/26/2015 9:55 AM Location: [facility] Patient #: [id number] DOB: [birth year] Single / Language: Caitlin Morris / Race: Black or African American Female   History of Present Illness Earnstine Regal MD; 08/26/2015 10:12 AM) The patient is a 65 year old female who presents with a parathyroid neoplasm.   Patient is referred by Dr. Erling Cruz for evaluation of suspected primary hyperparathyroidism. Patient's primary care physician is Dr. Iona Beard Osei-Bonsu. Patient was noted on routine laboratory studies to have an elevated calcium level of 10.3, an elevated intact PTH level of 81, and a very low vitamin D level of 9.7. Patient has been treated with vitamin D supplements in the past but is not currently taking vitamin D. Patient notes bone and joint pain particularly with arthritis. Patient denies fatigue. She denies frequent urination. By report she has had a bone density scan showing osteopenia. Patient does have stage III chronic kidney disease. She is followed by nephrology. Patient underwent nuclear medicine parathyroid scan on August 05, 2015 at the request of Dr. Erling Cruz. 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 Patient is now referred for surgical consultation and further recommendations. Patient has had no prior head or neck surgery. There is no family history of parathyroid disease or other endocrine neoplasm.   Other Problems Elbert Ewings, CMA; 08/26/2015 9:55 AM) Diabetes Mellitus High blood pressure Hypercholesterolemia  Past Surgical History Elbert Ewings, CMA; 08/26/2015 9:55 AM) Knee Surgery Left.  Diagnostic Studies History Elbert Ewings, [place]; 08/26/2015 9:55 AM) Colonoscopy never Pap Smear 1-5 years ago  Allergies Elbert Ewings, CMA; 08/26/2015 9:56 AM) Codeine Sulfate *ANALGESICS - OPIOID* Hives,  Swelling.  Medication History Elbert Ewings, CMA; 08/26/2015 9:57 AM) ClonazePAM (1MG  Tablet, Oral) Active. AmLODIPine Besylate (10MG  Tablet, Oral) Active. Atenolol (50MG  Tablet, Oral) Active. CloNIDine HCl (0.3MG  Tablet, Oral) Active. Gabapentin (600MG  Tablet, Oral) Active. Glimepiride (4MG  Tablet, Oral) Active. GlipiZIDE XL (5MG  Tablet ER 24HR, Oral) Active. Latanoprost (0.005% Solution, Ophthalmic) Active. Lisinopril-Hydrochlorothiazide (20-25MG  Tablet, Oral) Active. Pravastatin Sodium (80MG  Tablet, Oral) Active. Sertraline HCl (100MG  Tablet, Oral) Active. Hydrocodone-Acetaminophen (5-325MG  Tablet, Oral as needed) Active. Medications Reconciled  Social History Elbert Ewings, [place]; 08/26/2015 9:55 AM) Alcohol use Occasional alcohol use. Caffeine use Coffee.  Family History Elbert Ewings, [place]; 08/26/2015 9:55 AM) Alcohol Abuse Brother. Diabetes Mellitus Sister, Son. Hypertension Sister, Son. Kidney Disease Sister.  Pregnancy / Birth History Elbert Ewings, CMA; 08/26/2015 9:55 AM) Age at menarche 41 years. Gravida 4 Irregular periods Maternal age 56-20 Para 4    Review of Systems Elbert Ewings CMA; 08/26/2015 9:55 AM) HEENT Present- Wears glasses/contact lenses. Not Present- Earache, Hearing Loss, Hoarseness, Nose Bleed, Oral Ulcers, Ringing in the Ears, Seasonal Allergies, Sinus Pain, Sore Throat, Visual Disturbances and Yellow Eyes. Respiratory Not Present- Bloody sputum, Chronic Cough, Difficulty Breathing, Snoring and Wheezing. Cardiovascular Present- Leg Cramps and Swelling of Extremities. Not Present- Chest Pain, Difficulty Breathing Lying Down, Palpitations, Rapid Heart Rate and Shortness of Breath. Gastrointestinal Not Present- Abdominal Pain, Bloating, Bloody Stool, Change in Bowel Habits, Chronic diarrhea, Constipation, Difficulty Swallowing, Excessive gas, Gets full quickly at meals, Hemorrhoids, Indigestion, Nausea, Rectal Pain and Vomiting. Female  Genitourinary Not Present- Frequency, Nocturia, Painful Urination, Pelvic Pain and Urgency. Musculoskeletal Present- Joint Pain. Not Present- Back Pain, Joint Stiffness, Muscle Pain, Muscle Weakness and Swelling of Extremities. Neurological Present- Tingling. Not Present- Decreased Memory, Fainting, Headaches, Numbness, Seizures, Tremor, Trouble walking and Weakness.  Psychiatric Present- Anxiety and Depression. Not Present- Bipolar, Change in Sleep Pattern, Fearful and Frequent crying. Hematology Not Present- Blood Thinners, Easy Bruising, Excessive bleeding, Gland problems, HIV and Persistent Infections.  Vitals Elbert Ewings CMA; 08/26/2015 9:57 AM) 08/26/2015 9:57 AM Weight: 180 lb Height: 62in Body Surface Area: 1.83 m Body Mass Index: 32.92 kg/m  Temp.: 97.47F  Pulse: 52 (Regular)  BP: 132/78 (Sitting, Left Arm, Standard)       Physical Exam Earnstine Regal MD; 08/26/2015 10:12 AM) The physical exam findings are as follows: Note:General - appears comfortable, no distress; not diaphorectic  HEENT - normocephalic; sclerae clear, gaze conjugate; mucous membranes moist, dentition good; voice normal  Neck - symmetric on extension; no palpable anterior or posterior cervical adenopathy; no palpable masses in the thyroid bed  Chest - clear bilaterally without rhonchi, rales, or wheeze  Cor - regular rhythm with normal rate; no significant murmur  Ext - non-tender without significant edema or lymphedema  Neuro - grossly intact; no tremor    Assessment & Plan Earnstine Regal MD; 08/26/2015 10:15 AM) ELEVATED PARATHYROID HORMONE (E34.9) VITAMIN D DEFICIENCY (E55.9) Current Plans Pt Education - Pamphlet Given - The Parathyroid Surgery Book: discussed with patient and provided information. Started Ergocalciferol 50000UNIT, 1 (one) Capsule weekly, #15, 08/26/2015, No Refill. Follow Up - Call [facility] office after tests / studies done to discuss further plans  Patient presents with  an elevated intact PTH level, a serum calcium level at the upper range of normal, and a very low vitamin D level. Nuclear medicine scanning suggested a possible left parathyroid adenoma. Patient does have chronic kidney disease and osteopenia.  I have requested a thyroid ultrasound examination in hopes of identifying a left parathyroid adenoma. If this study is positive for adenoma, then I believe the patient will be a good candidate for minimally invasive parathyroidectomy. If there is no evidence of adenoma on the ultrasound exam, we will consider treatment of her vitamin D deficiency for 3-4 months and then repeat her laboratory studies.  Patient understands this plan and agrees to proceed with ultrasound examination in the near future.  Patient and I did discuss the potential need for minimally invasive parathyroidectomy. We discussed doing this as an outpatient surgical procedure. I provided her with written literature on parathyroid surgery to review at home. We will discuss this further pending the results of the above studies.  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  Plan left inferior minimally invasive parathyroidectomy.  The risks and benefits of the procedure have been discussed at length with the patient.  The patient understands the proposed procedure, potential alternative treatments, and the course of recovery to be expected.  All of the patient's questions have been answered at this time.  The patient wishes to proceed with surgery.  Earnstine Regal, MD, [facility] Office: [phone]

## 2015-11-09 ENCOUNTER — Ambulatory Visit (HOSPITAL_COMMUNITY): Payer: Medicare Other | Admitting: Anesthesiology

## 2015-11-09 ENCOUNTER — Encounter (HOSPITAL_COMMUNITY)
Admission: RE | Disposition: A | Discharge status: Home Health | Payer: Self-pay | Source: Ambulatory Visit | Attending: Surgery

## 2015-11-09 ENCOUNTER — Inpatient Hospital Stay (HOSPITAL_COMMUNITY)
Admission: RE | Admit: 2015-11-09 | Discharge: 2015-11-11 | DRG: 309 | Disposition: A | Discharge status: Home Health | Payer: Medicare Other | Source: Ambulatory Visit | Attending: Surgery | Admitting: Surgery

## 2015-11-09 ENCOUNTER — Encounter (HOSPITAL_COMMUNITY): Payer: Self-pay | Admitting: *Deleted

## 2015-11-09 DIAGNOSIS — I499 Cardiac arrhythmia, unspecified: Secondary | ICD-10-CM | POA: Diagnosis present

## 2015-11-09 DIAGNOSIS — Z79899 Other long term (current) drug therapy: Secondary | ICD-10-CM

## 2015-11-09 DIAGNOSIS — I129 Hypertensive chronic kidney disease with stage 1 through stage 4 chronic kidney disease, or unspecified chronic kidney disease: Secondary | ICD-10-CM | POA: Diagnosis present

## 2015-11-09 DIAGNOSIS — Z7984 Long term (current) use of oral hypoglycemic drugs: Secondary | ICD-10-CM

## 2015-11-09 DIAGNOSIS — E1142 Type 2 diabetes mellitus with diabetic polyneuropathy: Secondary | ICD-10-CM | POA: Diagnosis not present

## 2015-11-09 DIAGNOSIS — Z87891 Personal history of nicotine dependence: Secondary | ICD-10-CM | POA: Diagnosis not present

## 2015-11-09 DIAGNOSIS — Z6832 Body mass index (BMI) 32.0-32.9, adult: Secondary | ICD-10-CM | POA: Diagnosis not present

## 2015-11-09 DIAGNOSIS — I495 Sick sinus syndrome: Secondary | ICD-10-CM | POA: Diagnosis not present

## 2015-11-09 DIAGNOSIS — Z96652 Presence of left artificial knee joint: Secondary | ICD-10-CM | POA: Diagnosis not present

## 2015-11-09 DIAGNOSIS — R001 Bradycardia, unspecified: Principal | ICD-10-CM | POA: Diagnosis present

## 2015-11-09 DIAGNOSIS — D351 Benign neoplasm of parathyroid gland: Secondary | ICD-10-CM | POA: Diagnosis not present

## 2015-11-09 DIAGNOSIS — E1165 Type 2 diabetes mellitus with hyperglycemia: Secondary | ICD-10-CM | POA: Diagnosis not present

## 2015-11-09 DIAGNOSIS — I440 Atrioventricular block, first degree: Secondary | ICD-10-CM | POA: Diagnosis not present

## 2015-11-09 DIAGNOSIS — E1122 Type 2 diabetes mellitus with diabetic chronic kidney disease: Secondary | ICD-10-CM | POA: Diagnosis not present

## 2015-11-09 DIAGNOSIS — I248 Other forms of acute ischemic heart disease: Secondary | ICD-10-CM | POA: Diagnosis not present

## 2015-11-09 DIAGNOSIS — N183 Chronic kidney disease, stage 3 unspecified: Secondary | ICD-10-CM | POA: Diagnosis present

## 2015-11-09 DIAGNOSIS — E669 Obesity, unspecified: Secondary | ICD-10-CM | POA: Diagnosis present

## 2015-11-09 DIAGNOSIS — Z5309 Procedure and treatment not carried out because of other contraindication: Secondary | ICD-10-CM | POA: Diagnosis present

## 2015-11-09 DIAGNOSIS — E1121 Type 2 diabetes mellitus with diabetic nephropathy: Secondary | ICD-10-CM | POA: Diagnosis present

## 2015-11-09 DIAGNOSIS — F329 Major depressive disorder, single episode, unspecified: Secondary | ICD-10-CM | POA: Diagnosis present

## 2015-11-09 DIAGNOSIS — F419 Anxiety disorder, unspecified: Secondary | ICD-10-CM | POA: Diagnosis not present

## 2015-11-09 DIAGNOSIS — E1140 Type 2 diabetes mellitus with diabetic neuropathy, unspecified: Secondary | ICD-10-CM | POA: Diagnosis present

## 2015-11-09 DIAGNOSIS — I10 Essential (primary) hypertension: Secondary | ICD-10-CM | POA: Diagnosis present

## 2015-11-09 DIAGNOSIS — M8580 Other specified disorders of bone density and structure, unspecified site: Secondary | ICD-10-CM | POA: Diagnosis not present

## 2015-11-09 DIAGNOSIS — F418 Other specified anxiety disorders: Secondary | ICD-10-CM | POA: Diagnosis present

## 2015-11-09 DIAGNOSIS — I493 Ventricular premature depolarization: Secondary | ICD-10-CM | POA: Diagnosis not present

## 2015-11-09 DIAGNOSIS — E559 Vitamin D deficiency, unspecified: Secondary | ICD-10-CM | POA: Diagnosis not present

## 2015-11-09 DIAGNOSIS — E785 Hyperlipidemia, unspecified: Secondary | ICD-10-CM | POA: Diagnosis present

## 2015-11-09 DIAGNOSIS — E210 Primary hyperparathyroidism: Secondary | ICD-10-CM | POA: Diagnosis not present

## 2015-11-09 DIAGNOSIS — IMO0002 Reserved for concepts with insufficient information to code with codable children: Secondary | ICD-10-CM | POA: Diagnosis present

## 2015-11-09 LAB — COMPREHENSIVE METABOLIC PANEL
ALK PHOS: 105 U/L (ref 38–126)
ALT: 17 U/L (ref 14–54)
AST: 20 U/L (ref 15–41)
Albumin: 3.7 g/dL (ref 3.5–5.0)
Anion gap: 8 (ref 5–15)
BUN: 40 mg/dL — ABNORMAL HIGH (ref 6–20)
CALCIUM: 10.1 mg/dL (ref 8.9–10.3)
CO2: 29 mmol/L (ref 22–32)
CREATININE: 1.59 mg/dL — AB (ref 0.44–1.00)
Chloride: 107 mmol/L (ref 101–111)
GFR calc non Af Amer: 33 mL/min — ABNORMAL LOW (ref >60)
GFR, EST AFRICAN AMERICAN: 38 mL/min — AB (ref >60)
Glucose, Bld: 181 mg/dL — ABNORMAL HIGH (ref 65–99)
Potassium: 3.6 mmol/L (ref 3.5–5.1)
SODIUM: 144 mmol/L (ref 135–145)
Total Bilirubin: 0.7 mg/dL (ref 0.3–1.2)
Total Protein: 7.3 g/dL (ref 6.5–8.1)

## 2015-11-09 LAB — CBC
HEMATOCRIT: 33.6 % — AB (ref 36.0–46.0)
Hemoglobin: 10.9 g/dL — ABNORMAL LOW (ref 12.0–15.0)
MCH: 26.9 pg (ref 26.0–34.0)
MCHC: 32.4 g/dL (ref 30.0–36.0)
MCV: 83 fL (ref 78.0–100.0)
Platelets: 207 10*3/uL (ref 150–400)
RBC: 4.05 MIL/uL (ref 3.87–5.11)
RDW: 14 % (ref 11.5–15.5)
WBC: 4.7 10*3/uL (ref 4.0–10.5)

## 2015-11-09 LAB — GLUCOSE, CAPILLARY
GLUCOSE-CAPILLARY: 200 mg/dL — AB (ref 65–99)
Glucose-Capillary: 341 mg/dL — ABNORMAL HIGH (ref 65–99)
Glucose-Capillary: 333 mg/dL — ABNORMAL HIGH (ref 65–99)
Glucose-Capillary: 283 mg/dL — ABNORMAL HIGH (ref 65–99)
Glucose-Capillary: 149 mg/dL — ABNORMAL HIGH (ref 65–99)
Glucose-Capillary: 274 mg/dL — ABNORMAL HIGH (ref 65–99)

## 2015-11-09 LAB — TROPONIN I: Troponin I: 0.03 ng/mL (ref <0.03)

## 2015-11-09 LAB — CK TOTAL AND CKMB (NOT AT ARMC)
CK, MB: 1.2 ng/mL (ref 0.5–5.0)
Relative Index: INVALID (ref 0.0–2.5)
Total CK: 62 U/L (ref 38–234)

## 2015-11-09 SURGERY — PARATHYROIDECTOMY
Anesthesia: General

## 2015-11-09 MED ORDER — CHLORHEXIDINE GLUCONATE CLOTH 2 % EX PADS: 6.0000 | MEDICATED_PAD | Freq: Once | CUTANEOUS | Status: DC

## 2015-11-09 MED ORDER — CLONIDINE HCL 0.1 MG PO TABS
0.1000 mg | ORAL_TABLET | Freq: Three times a day (TID) | ORAL | Status: DC
  Administered 2015-11-09: 0.1 mg via ORAL
  Filled 2015-11-09: qty 1

## 2015-11-09 MED ORDER — INSULIN ASPART 100 UNIT/ML ~~LOC~~ SOLN
0.0000 [IU] | Freq: Three times a day (TID) | SUBCUTANEOUS | Status: DC
  Administered 2015-11-09: 2 [IU] via SUBCUTANEOUS

## 2015-11-09 MED ORDER — SUGAMMADEX SODIUM 200 MG/2ML IV SOLN
INTRAVENOUS | Status: AC
  Filled 2015-11-09: qty 2

## 2015-11-09 MED ORDER — LACTATED RINGERS IV SOLN
INTRAVENOUS | Status: DC
  Administered 2015-11-09: 1000 mL via INTRAVENOUS

## 2015-11-09 MED ORDER — MIDAZOLAM HCL 2 MG/2ML IJ SOLN
INTRAMUSCULAR | Status: AC
  Filled 2015-11-09: qty 2

## 2015-11-09 MED ORDER — HYDRALAZINE HCL 20 MG/ML IJ SOLN: 5.0000 mg | INTRAMUSCULAR | Status: DC | PRN

## 2015-11-09 MED ORDER — SERTRALINE HCL 100 MG PO TABS
100.0000 mg | ORAL_TABLET | Freq: Every day | ORAL | Status: DC
  Administered 2015-11-09: 100 mg via ORAL
  Filled 2015-11-09 (×2): qty 1

## 2015-11-09 MED ORDER — PROMETHAZINE HCL 25 MG/ML IJ SOLN: 6.2500 mg | INTRAMUSCULAR | Status: DC | PRN

## 2015-11-09 MED ORDER — ONDANSETRON 4 MG PO TBDP
4.0000 mg | ORAL_TABLET | Freq: Four times a day (QID) | ORAL | Status: DC | PRN
  Filled 2015-11-09: qty 1

## 2015-11-09 MED ORDER — CEFAZOLIN SODIUM-DEXTROSE 2-4 GM/100ML-% IV SOLN
2.0000 g | INTRAVENOUS | Status: DC
  Filled 2015-11-09: qty 100

## 2015-11-09 MED ORDER — POTASSIUM CHLORIDE IN NACL 20-0.45 MEQ/L-% IV SOLN
INTRAVENOUS | Status: AC
  Administered 2015-11-09: 17:00:00 via INTRAVENOUS
  Filled 2015-11-09: qty 1000

## 2015-11-09 MED ORDER — INSULIN GLARGINE 100 UNIT/ML ~~LOC~~ SOLN
6.0000 [IU] | Freq: Every day | SUBCUTANEOUS | Status: DC
  Administered 2015-11-09: 6 [IU] via SUBCUTANEOUS
  Filled 2015-11-09 (×2): qty 0.06

## 2015-11-09 MED ORDER — ATROPINE SULFATE 0.4 MG/ML IV SOSY
PREFILLED_SYRINGE | INTRAVENOUS | Status: AC
  Filled 2015-11-09: qty 2.5

## 2015-11-09 MED ORDER — ATROPINE SULFATE 1 MG/ML IJ SOLN
INTRAMUSCULAR | Status: DC | PRN
  Administered 2015-11-09: 0.2 mg via INTRAVENOUS

## 2015-11-09 MED ORDER — PROPOFOL 10 MG/ML IV BOLUS
INTRAVENOUS | Status: DC | PRN
  Administered 2015-11-09 (×2): 20 mg via INTRAVENOUS
  Administered 2015-11-09: 100 mg via INTRAVENOUS

## 2015-11-09 MED ORDER — ENOXAPARIN SODIUM 30 MG/0.3ML ~~LOC~~ SOLN
30.0000 mg | SUBCUTANEOUS | Status: DC
  Administered 2015-11-09 – 2015-11-10 (×2): 30 mg via SUBCUTANEOUS
  Filled 2015-11-09 (×3): qty 0.3

## 2015-11-09 MED ORDER — LIDOCAINE 2% (20 MG/ML) 5 ML SYRINGE
INTRAMUSCULAR | Status: AC
  Filled 2015-11-09: qty 10

## 2015-11-09 MED ORDER — GABAPENTIN 300 MG PO CAPS
600.0000 mg | ORAL_CAPSULE | Freq: Two times a day (BID) | ORAL | Status: DC
  Administered 2015-11-09 – 2015-11-11 (×4): 600 mg via ORAL
  Filled 2015-11-09 (×4): qty 2

## 2015-11-09 MED ORDER — AMLODIPINE BESYLATE 10 MG PO TABS
10.0000 mg | ORAL_TABLET | Freq: Every day | ORAL | Status: DC
  Administered 2015-11-10 – 2015-11-11 (×2): 10 mg via ORAL
  Filled 2015-11-09 (×3): qty 1

## 2015-11-09 MED ORDER — ONDANSETRON HCL 4 MG/2ML IJ SOLN: 4.0000 mg | Freq: Four times a day (QID) | INTRAMUSCULAR | Status: DC | PRN

## 2015-11-09 MED ORDER — EPHEDRINE 5 MG/ML INJ
INTRAVENOUS | Status: AC
  Filled 2015-11-09: qty 10

## 2015-11-09 MED ORDER — LACTATED RINGERS IV SOLN
INTRAVENOUS | Status: DC | PRN
  Administered 2015-11-09: 13:00:00 via INTRAVENOUS

## 2015-11-09 MED ORDER — SUCCINYLCHOLINE CHLORIDE 200 MG/10ML IV SOSY
PREFILLED_SYRINGE | INTRAVENOUS | Status: DC | PRN
  Administered 2015-11-09: 100 mg via INTRAVENOUS

## 2015-11-09 MED ORDER — FENTANYL CITRATE (PF) 100 MCG/2ML IJ SOLN
INTRAMUSCULAR | Status: AC
  Filled 2015-11-09: qty 2

## 2015-11-09 MED ORDER — PHENYLEPHRINE 40 MCG/ML (10ML) SYRINGE FOR IV PUSH (FOR BLOOD PRESSURE SUPPORT)
PREFILLED_SYRINGE | INTRAVENOUS | Status: AC
  Filled 2015-11-09: qty 10

## 2015-11-09 MED ORDER — INSULIN ASPART 100 UNIT/ML ~~LOC~~ SOLN
10.0000 [IU] | SUBCUTANEOUS | Status: AC
  Administered 2015-11-09: 10 [IU] via SUBCUTANEOUS
  Filled 2015-11-09: qty 1

## 2015-11-09 MED ORDER — ROCURONIUM BROMIDE 50 MG/5ML IV SOSY
PREFILLED_SYRINGE | INTRAVENOUS | Status: AC
  Filled 2015-11-09: qty 5

## 2015-11-09 MED ORDER — LIDOCAINE 2% (20 MG/ML) 5 ML SYRINGE
INTRAMUSCULAR | Status: DC | PRN
  Administered 2015-11-09: 40 mg via INTRAVENOUS

## 2015-11-09 MED ORDER — CLONAZEPAM 1 MG PO TABS
1.0000 mg | ORAL_TABLET | Freq: Every day | ORAL | Status: DC
  Administered 2015-11-09 – 2015-11-10 (×2): 1 mg via ORAL
  Filled 2015-11-09 (×2): qty 1

## 2015-11-09 MED ORDER — GLYCOPYRROLATE 0.2 MG/ML IJ SOLN
INTRAMUSCULAR | Status: DC | PRN
  Administered 2015-11-09: 0.4 mg via INTRAVENOUS

## 2015-11-09 MED ORDER — HYDROMORPHONE HCL 1 MG/ML IJ SOLN: 0.2500 mg | INTRAMUSCULAR | Status: DC | PRN

## 2015-11-09 MED ORDER — HYDROCODONE-ACETAMINOPHEN 5-325 MG PO TABS
1.0000 | ORAL_TABLET | ORAL | Status: DC | PRN
  Administered 2015-11-11: 1 via ORAL
  Filled 2015-11-09: qty 1

## 2015-11-09 MED ORDER — CEFAZOLIN SODIUM-DEXTROSE 2-4 GM/100ML-% IV SOLN
INTRAVENOUS | Status: AC
  Filled 2015-11-09: qty 100

## 2015-11-09 MED ORDER — PROPOFOL 10 MG/ML IV BOLUS
INTRAVENOUS | Status: AC
  Filled 2015-11-09: qty 20

## 2015-11-09 MED ORDER — SUGAMMADEX SODIUM 500 MG/5ML IV SOLN
INTRAVENOUS | Status: AC
  Filled 2015-11-09: qty 5

## 2015-11-09 MED ORDER — BUPIVACAINE HCL (PF) 0.5 % IJ SOLN
INTRAMUSCULAR | Status: AC
  Filled 2015-11-09: qty 30

## 2015-11-09 MED ORDER — 0.9 % SODIUM CHLORIDE (POUR BTL) OPTIME
TOPICAL | Status: DC | PRN
  Administered 2015-11-09: 1000 mL

## 2015-11-09 MED ORDER — ALLOPURINOL 100 MG PO TABS
100.0000 mg | ORAL_TABLET | Freq: Two times a day (BID) | ORAL | Status: DC
  Administered 2015-11-09 – 2015-11-11 (×4): 100 mg via ORAL
  Filled 2015-11-09 (×5): qty 1

## 2015-11-09 MED ORDER — ONDANSETRON HCL 4 MG/2ML IJ SOLN
INTRAMUSCULAR | Status: AC
  Filled 2015-11-09: qty 2

## 2015-11-09 SURGICAL SUPPLY — 41 items
ADH SKN CLS APL DERMABOND .7 (GAUZE/BANDAGES/DRESSINGS) ×1
APL SKNCLS STERI-STRIP NONHPOA (GAUZE/BANDAGES/DRESSINGS)
ATTRACTOMAT 16X20 MAGNETIC DRP (DRAPES) ×4 IMPLANT
BENZOIN TINCTURE PRP APPL 2/3 (GAUZE/BANDAGES/DRESSINGS) IMPLANT
BLADE HEX COATED 2.75 (ELECTRODE) ×4 IMPLANT
BLADE SURG 15 STRL LF DISP TIS (BLADE) ×2 IMPLANT
BLADE SURG 15 STRL SS (BLADE) ×3
CHLORAPREP W/TINT 26ML (MISCELLANEOUS) ×5 IMPLANT
CLIP TI MEDIUM 6 (CLIP) ×5 IMPLANT
CLIP TI WIDE RED SMALL 6 (CLIP) ×5 IMPLANT
CLOSURE WOUND 1/2 X4 (GAUZE/BANDAGES/DRESSINGS)
COVER SURGICAL LIGHT HANDLE (MISCELLANEOUS) ×4 IMPLANT
DERMABOND ADVANCED (GAUZE/BANDAGES/DRESSINGS) ×2
DERMABOND ADVANCED .7 DNX12 (GAUZE/BANDAGES/DRESSINGS) ×1 IMPLANT
DRAPE LAPAROTOMY T 98X78 PEDS (DRAPES) ×4 IMPLANT
ELECT PENCIL ROCKER SW 15FT (MISCELLANEOUS) ×4 IMPLANT
ELECT REM PT RETURN 9FT ADLT (ELECTROSURGICAL) ×3
ELECTRODE REM PT RTRN 9FT ADLT (ELECTROSURGICAL) ×2 IMPLANT
GAUZE SPONGE 4X4 16PLY XRAY LF (GAUZE/BANDAGES/DRESSINGS) ×4 IMPLANT
GLOVE SURG ORTHO 8.0 STRL STRW (GLOVE) ×4 IMPLANT
GOWN STRL REUS W/TWL XL LVL3 (GOWN DISPOSABLE) ×12 IMPLANT
HEMOSTAT SURGICEL 2X4 FIBR (HEMOSTASIS) ×4 IMPLANT
ILLUMINATOR WAVEGUIDE N/F (MISCELLANEOUS) ×3 IMPLANT
KIT BASIN OR (CUSTOM PROCEDURE TRAY) ×4 IMPLANT
LIGHT WAVEGUIDE WIDE FLAT (MISCELLANEOUS) IMPLANT
NDL HYPO 25X1 1.5 SAFETY (NEEDLE) ×1 IMPLANT
NEEDLE HYPO 25X1 1.5 SAFETY (NEEDLE) ×3 IMPLANT
PACK BASIC VI WITH GOWN DISP (CUSTOM PROCEDURE TRAY) ×4 IMPLANT
STAPLER VISISTAT 35W (STAPLE) ×4 IMPLANT
STRIP CLOSURE SKIN 1/2X4 (GAUZE/BANDAGES/DRESSINGS) IMPLANT
SUT MNCRL AB 4-0 PS2 18 (SUTURE) ×4 IMPLANT
SUT SILK 2 0 (SUTURE)
SUT SILK 2-0 18XBRD TIE 12 (SUTURE) IMPLANT
SUT SILK 3 0 (SUTURE)
SUT SILK 3-0 18XBRD TIE 12 (SUTURE) IMPLANT
SUT VIC AB 3-0 SH 18 (SUTURE) ×7 IMPLANT
SYR BULB IRRIGATION 50ML (SYRINGE) ×4 IMPLANT
SYR CONTROL 10ML LL (SYRINGE) ×4 IMPLANT
TOWEL OR 17X26 10 PK STRL BLUE (TOWEL DISPOSABLE) ×4 IMPLANT
TOWEL OR NON WOVEN STRL DISP B (DISPOSABLE) ×4 IMPLANT
YANKAUER SUCT BULB TIP 10FT TU (MISCELLANEOUS) ×4 IMPLANT

## 2015-11-09 NOTE — Anesthesia Procedure Notes (Signed)
Procedure Name: Intubation Date/Time: 11/09/2015 1:27 PM Performed by: Cynda Familia Pre-anesthesia Checklist: Patient identified, Emergency Drugs available, Suction available and Patient being monitored Patient Re-evaluated:Patient Re-evaluated prior to inductionOxygen Delivery Method: Circle System Utilized Preoxygenation: Pre-oxygenation with 100% oxygen Intubation Type: IV induction Ventilation: Mask ventilation without difficulty Laryngoscope Size: Miller and 2 Grade View: Grade I Tube type: Oral Number of attempts: 1 Airway Equipment and Method: Stylet and Oral airway Placement Confirmation: ETT inserted through vocal cords under direct vision,  positive ETCO2 and breath sounds checked- equal and bilateral Secured at: 22 cm Tube secured with: Tape Dental Injury: Teeth and Oropharynx as per pre-operative assessment  Comments: Smooth  IV induction Massagee--  Intubation AM CRNA atraumatic-- very poor dentition-- teeth and mouth as preop-- bilat BS Massagee

## 2015-11-09 NOTE — Op Note (Signed)
General Surgery [facility]  Patient brought to OR#4 for parathyroidectomy.  During induction, significant bradycardia with escape PVC's and loss of P waves.  After discussion with anesthesia, decision to cancel case and initiate medical and cardiac evaluation.  Patient awakened from anesthesia and brought to PACU.  Will admit to telemetry unit and consult medical service and cardiology.  tmg  Earnstine Regal, MD, [facility] Office: [phone]

## 2015-11-09 NOTE — Consult Note (Signed)
Admit date: 11/09/2015 Referring Physician  Dr. Harlow Asa Primary Physician Benito Mccreedy, MD Primary Cardiologist  [place]) Reason for Consultation  Bradycardia  HPI: 65 year old female with planned left inferior parathyroidectomy which was canceled by Dr. Orene Desanctis secondary to bradycardia with escape PVCs. During induction, there was significant bradycardia, loss of P waves were noted. Admission to telemetry service.  She had been followed by Dr. Adriana Mccallum of nephrology. Also has diabetes, hypertension, hyperlipidemia.  At home, she takes atenolol 50 mg and clonidine 0.3 mg. In addition she is on lisinopril hydrochlorothiazide and amlodipine for blood pressure.  She denies any syncopal episodes at home, no shortness of breath, no chest pain here or at home.  Prior EKG from 08/29/2015 showed marked sinus bradycardia as we are seeing currently with heart rate of 41 bpm  PMH:   Past Medical History:  Diagnosis Date  . Anxiety   . Arthritis   . Depression   . Diabetes mellitus without complication ([place])   . Hyperlipidemia   . Hypertension   . Obesity   . Stroke [facility])     PSH:   Past Surgical History:  Procedure Laterality Date  . JOINT REPLACEMENT     Left TKA  . TOTAL KNEE ARTHROPLASTY Left 10/01/2013   Procedure: TOTAL KNEE ARTHROPLASTY;  Surgeon: Nita Sells, MD;  Location: [place];  Service: Orthopedics;  Laterality: Left;  Left total knee arthroplasty  . TUBAL LIGATION     Allergies:  Codeine Prior to Admit Meds:   Prior to Admission medications   Medication Sig Start Date End Date Taking? Authorizing Provider  allopurinol (ZYLOPRIM) 100 MG tablet Take 100 mg by mouth 2 (two) times daily.  10/22/15  Yes Historical Provider, MD  amLODipine (NORVASC) 10 MG tablet Take 10 mg by mouth daily.   Yes Historical Provider, MD  atenolol (TENORMIN) 50 MG tablet Take 50 mg by mouth daily.   Yes Historical Provider, MD  clonazePAM (KLONOPIN) 1 MG tablet Take 1 mg  by mouth at bedtime.    Yes Historical Provider, MD  cloNIDine (CATAPRES) 0.3 MG tablet Take 0.3 mg by mouth 3 (three) times daily. 0.3mg  AM and 0.6mg  PM    Yes Historical Provider, MD  diclofenac sodium (VOLTAREN) 1 % GEL Apply 2 g topically 4 (four) times daily as needed (JOINT PAIN).    Yes Historical Provider, MD  gabapentin (NEURONTIN) 600 MG tablet Take 600 mg by mouth 2 (two) times daily.   Yes Historical Provider, MD  glimepiride (AMARYL) 4 MG tablet Take 4 mg by mouth daily with breakfast.  08/23/15  Yes Historical Provider, MD  glipiZIDE (GLUCOTROL XL) 5 MG 24 hr tablet Take 5 mg by mouth daily with breakfast.   Yes Historical Provider, MD  latanoprost (XALATAN) 0.005 % ophthalmic solution Place 1 drop into both eyes at bedtime. 08/03/15  Yes Historical Provider, MD  naproxen sodium (ANAPROX) 220 MG tablet Take 220 mg by mouth 2 (two) times daily with a meal.   Yes Historical Provider, MD  pravastatin (PRAVACHOL) 80 MG tablet Take 80 mg by mouth at bedtime. 07/10/15  Yes Historical Provider, MD  PRESCRIPTION MEDICATION Apply 1 application topically daily as needed (JOINT PAIN). CARE ALL   Yes Historical Provider, MD  sertraline (ZOLOFT) 100 MG tablet Take 100 mg by mouth daily.   Yes Historical Provider, MD  tetrahydrozoline-zinc (VISINE-AC) 0.05-0.25 % ophthalmic solution Place 2 drops into both eyes 3 (three) times daily as needed (DRY EYES).   Yes  Historical Provider, MD  HYDROcodone-acetaminophen (NORCO/VICODIN) 5-325 MG tablet TAKE 1 TABLET EVERY 8 HOURS AS MEED FOR PAIN 10/13/15   Historical Provider, MD  lisinopril-hydrochlorothiazide (PRINZIDE,ZESTORETIC) 20-25 MG per tablet Take 1 tablet by mouth daily.    Historical Provider, MD  traMADol (ULTRAM) 50 MG tablet Take 1 tablet (50 mg total) by mouth every 6 (six) hours as needed. Patient not taking: Reported on 10/30/2015 10/15/15   Junius Creamer, NP   Fam HX:   No early cardiac history. Both her sister and son have diabetes.  Social HX:     Social History   Social History  . Marital status: Single    Spouse name: N/A  . Number of children: N/A  . Years of education: N/A   Occupational History  . Not on file.   Social History Main Topics  . Smoking status: Former Smoker    Packs/day: 1.00    Years: 30.00    Types: Cigarettes    Quit date: 01/24/2005  . Smokeless tobacco: Never Used  . Alcohol use No  . Drug use: No  . Sexual activity: Not on file   Other Topics Concern  . Not on file   Social History Narrative  . No narrative on file     ROS:  All 11 ROS were addressed and are negative except what is stated in the HPI   Physical Exam: Blood pressure 111/71, pulse (!) 43, temperature 97.8 F (36.6 C), resp. rate 15, height 5\' 2"  (1.575 m), weight 179 lb 9 oz (81.4 kg), SpO2 98 %.   General: Well developed, well nourished, in no acute distress Head: Eyes PERRLA, No xanthomas.   Normal cephalic and atramatic  Lungs:   Clear bilaterally to auscultation and percussion. Normal respiratory effort. No wheezes, no rales. Heart:   Bradycardic regular S1 S2 Pulses are 2+ & equal. No murmur, rubs, gallops.  No carotid bruit. No JVD.  No abdominal bruits.  Abdomen: Bowel sounds are positive, abdomen soft and non-tender without masses. No hepatosplenomegaly. Msk:  Back normal. Normal strength and tone for age. Extremities:  No clubbing, cyanosis or edema.  DP +1 Neuro: Sleepy but arousable, non-focal, MAE x 4 GU: Deferred Rectal: Deferred Psych:  Good affect, responds appropriately when questioned      Labs: Lab Results  Component Value Date   WBC 4.7 11/09/2015   HGB 10.9 (L) 11/09/2015   HCT 33.6 (L) 11/09/2015   MCV 83.0 11/09/2015   PLT 207 11/09/2015     Recent Labs Lab 11/06/15 1130  NA 136  K 4.1  CL 98*  CO2 26  BUN 38*  CREATININE 1.68*  CALCIUM 10.5*  GLUCOSE 528*   No results for input(s): CKTOTAL, CKMB, TROPONINI in the last 72 hours. Lab Results  Component Value Date   CHOL 205  (H) 05/05/2009   HDL 43 05/05/2009   LDLCALC 142 (H) 05/05/2009   TRIG 98 05/05/2009   No results found for: DDIMER  TSH 0.619, free T4 0.85-normal limits.   Radiology:  No results found. Personally viewed.  EKG:  11/09/15-2:04 PM-heart rate 49 bpm, sinus bradycardia with mild first-degree AV block 230 ms with nonspecific ST-T wave flattening. Personally viewed. Prior EKG from 08/29/15 showed marked sinus bradycardia rate 41 as is currently being seen on telemetry.  Telemetry strips during anesthesia personally reviewed-ventricular bigeminy noted as well as sinus bradycardia heart rate in the 40s.  ASSESSMENT/PLAN:    65 year old female with suspected parathyroid adenoma who during induction  for parathyroidectomy developed significant bradycardia, PVCs, absent P waves reported at times. EKG demonstrates heart rate of 49 bpm, sinus bradycardia with first-degree AV block of 230 ms.  Bradycardia  - Potassium 3.6 within normal range, creatinine 1.59, hemoglobin [id number]  - Certainly her drugs, atenolol, beta blocker as well as clonidine can sometimes result in worsening bradycardia. Her bradycardia was also noted on EKG back in August 5 of 2017 with heart rate of 41 bpm.  - I think would be useful to discontinue the atenolol. She states that she took this this morning.  - It may not be unreasonable as well to discontinue or taper away the clonidine as this can affect bradycardia. Watch for any signs of rebound hypertension  - Certainly during the induction process, a hyper vagal response may have occurred which resulted in significant bradycardia, however she continues to demonstrate sinus bradycardia heart rate approximately 40 bpm while talking to me after gentle stimulation to wake up. Her prior EKG from August also shows bradycardia as demonstrated on current telemetry.  - Ventricular bigeminy in the form of frequent PVCs with subsequent pauses also noted on telemetry. This led to an effective  heart rate in the 20s.  - We will order an echocardiogram to ensure proper structure and function of her heart.  - First troponin came back at 0.03 (triggered an abnormal value). Continue to cycle this every 6 hours for 2 more values. She did not complain of any chest discomfort. Likely demand ischemia.  - Keep potassium greater than 4.  Parathyroid adenoma  - Awaiting thyroidectomy. Hopefully after discontinuing medication which can result in bradycardia, she will be able to proceed with surgery.  Diabetes-per primary team  Essential hypertension-continue to monitor since we're discontinuing some of her agents.  Chronic kidney disease stage III/4 - creatinine 1.68  We will follow along.  Candee Furbish, MD  11/09/2015  2:55 PM

## 2015-11-09 NOTE — Anesthesia Postprocedure Evaluation (Signed)
Anesthesia Post Note  Patient: Caitlin Morris  Procedure(s) Performed: Procedure(s) (LRB): LEFT INFERIOR PARATHYROIDECTOMY (Left)  Patient location during evaluation: PACU Anesthesia Type: General Level of consciousness: awake and alert Pain management: pain level controlled Vital Signs Assessment: post-procedure vital signs reviewed and stable Respiratory status: spontaneous breathing, nonlabored ventilation, respiratory function stable and patient connected to nasal cannula oxygen Cardiovascular status: blood pressure returned to baseline and stable Postop Assessment: no signs of nausea or vomiting Anesthetic complications: no (surgery not done due to bradycardia intraop.) Comments: Hospitalist consult obtained    Last Vitals:  Vitals:   11/09/15 1400 11/09/15 1415  BP:    Pulse:  (!) 46  Resp:  (!) 23  Temp: (P) 36.6 C     Last Pain:  Vitals:   11/09/15 1225  TempSrc:   PainSc: 0-No pain                 Freada Twersky,JAMES TERRILL

## 2015-11-09 NOTE — Anesthesia Preprocedure Evaluation (Addendum)
Anesthesia Evaluation  Patient identified by MRN, date of birth, ID band Patient awake    Reviewed: Allergy & Precautions, NPO status , Patient's Chart, lab work & pertinent test results  History of Anesthesia Complications Negative for: history of anesthetic complications  Airway Mallampati: II  TM Distance: >3 FB Neck ROM: Full    Dental no notable dental hx. (+) Dental Advisory Given   Pulmonary former smoker,    Pulmonary exam normal breath sounds clear to auscultation       Cardiovascular hypertension, Pt. on medications Normal cardiovascular exam Rhythm:Regular Rate:Normal     Neuro/Psych PSYCHIATRIC DISORDERS Anxiety Depression Bipolar Disorder CVA    GI/Hepatic negative GI ROS, Neg liver ROS,   Endo/Other  diabetesobesity  Renal/GU negative Renal ROS  negative genitourinary   Musculoskeletal  (+) Arthritis ,   Abdominal   Peds negative pediatric ROS (+)  Hematology negative hematology ROS (+)   Anesthesia Other Findings   Reproductive/Obstetrics negative OB ROS                             Anesthesia Physical Anesthesia Plan  ASA: III  Anesthesia Plan: General   Post-op Pain Management:    Induction: Intravenous  Airway Management Planned: Oral ETT  Additional Equipment:   Intra-op Plan:   Post-operative Plan: Extubation in OR  Informed Consent: I have reviewed the patients History and Physical, chart, labs and discussed the procedure including the risks, benefits and alternatives for the proposed anesthesia with the patient or authorized representative who has indicated his/her understanding and acceptance.   Dental advisory given  Plan Discussed with: CRNA  Anesthesia Plan Comments:        Anesthesia Quick Evaluation

## 2015-11-09 NOTE — Progress Notes (Signed)
CRITICAL VALUE ALERT  Critical value received:  Troponin value 0.03  Date of notification:  11/09/15  Time of notification:  [id number]  Critical value read back: yes   Nurse who received alert:  Gentry Roch RN  MD notified (1st page):  Dr Marlou Porch in at bedside

## 2015-11-09 NOTE — Transfer of Care (Signed)
Immediate Anesthesia Transfer of Care Note  Patient: Caitlin Morris  Procedure(s) Performed: Procedure(s): LEFT INFERIOR PARATHYROIDECTOMY (Left)  Patient Location: PACU  Anesthesia Type:General  Level of Consciousness: awake and alert   Airway & Oxygen Therapy: Patient Spontanous Breathing and Patient connected to face mask oxygen  Post-op Assessment: Report given to RN and Post -op Vital signs reviewed and stable  Post vital signs: Reviewed and stable  Last Vitals:  Vitals:   11/09/15 1024  BP: (!) 157/75  Pulse: (!) 45  Resp: 16  Temp: 36.6 C    Last Pain:  Vitals:   11/09/15 1225  TempSrc:   PainSc: 0-No pain      Patients Stated Pain Goal: 3 ([id number])  Complications: No apparent anesthesia complications

## 2015-11-09 NOTE — OR Nursing (Addendum)
After intubation the decision to cancel the case was made by Dr. Orene Desanctis due to Ms. Winterhalter's bradycardia with escape PVC's. Mohammed Kindle, RN

## 2015-11-09 NOTE — Consult Note (Signed)
Patient Demographics  Caitlin Morris, is a 65 y.o. female   MRN: [id number]   DOB - Jun 05, 1950  Admit Date - 11/09/2015    Outpatient Primary MD for the patient is OSEI-BONSU,GEORGE, MD  Consult requested in the Hospital by Armandina Gemma, MD, On 11/09/2015    Reason for consult : Sinus bradycardia and medical management   With History of -  Past Medical History:  Diagnosis Date  . Anxiety   . Arthritis   . Depression   . Diabetes mellitus without complication ([place])   . Hyperlipidemia   . Hypertension   . Obesity   . Stroke [facility])       Past Surgical History:  Procedure Laterality Date  . JOINT REPLACEMENT     Left TKA  . TOTAL KNEE ARTHROPLASTY Left 10/01/2013   Procedure: TOTAL KNEE ARTHROPLASTY;  Surgeon: Nita Sells, MD;  Location: [place];  Service: Orthopedics;  Laterality: Left;  Left total knee arthroplasty  . TUBAL LIGATION      in for   No chief complaint on file.    HPI  Caitlin Morris  is a 65 y.o. female, With past medical history of diabetes mellitus, poorly controlled, primary hyperparathyroidism, depression, anxiety, hypertension, patient presents to or today for elective parathyroidectomy for primary hyperparathyroidism, after anesthesia induction, intubation, she was on propofol, received succinyl choline, patient was noticed to have bradycardia with a heart rate in the 20s, so surgery was canceled, and patient transferred to telemetry floor for further evaluation, patient with known history of diabetes mellitus, poorly controlled, A1c 11.5 last week, she is on atenolol and clonidine and amlodipine for hypertension, EKG in the past showing bradycardia with heart rate in the 40s, she denies any chest pain, dyspnea, shortness of breath, dizziness or lightheadedness or syncopal episode.  Review of Systems    In addition to the HPI above,  No Fever-chills, No  Headache, No changes with Vision or hearing, No problems swallowing food or Liquids, No Chest pain, Cough or Shortness of Breath, No Abdominal pain, No Nausea or Vommitting, Bowel movements are regular, No Blood in stool or Urine, No dysuria, No new skin rashes or bruises, No new joints pains-aches,  No new weakness, tingling, numbness in any extremity, No recent weight gain or loss, No polyuria, polydypsia or polyphagia, No significant Mental Stressors.  A full 10 point Review of Systems was done, except as stated above, all other Review of Systems were negative.   Social History Social History  Substance Use Topics  . Smoking status: Former Smoker    Packs/day: 1.00    Years: 30.00    Types: Cigarettes    Quit date: 01/24/2005  . Smokeless tobacco: Never Used  . Alcohol use No     Family History History reviewed. No pertinent family history.   Prior to Admission medications   Medication Sig Start Date End Date Taking? Authorizing Provider  allopurinol (ZYLOPRIM) 100 MG tablet Take 100 mg by mouth 2 (two) times daily.  10/22/15  Yes Historical Provider, MD  amLODipine (NORVASC) 10 MG tablet Take 10 mg by mouth daily.   Yes Historical Provider, MD  atenolol (TENORMIN) 50 MG tablet Take 50 mg by mouth daily.   Yes Historical Provider, MD  clonazePAM (KLONOPIN) 1 MG tablet Take 1 mg by mouth at bedtime.    Yes Historical Provider, MD  cloNIDine (CATAPRES) 0.3 MG tablet Take 0.3 mg by mouth 3 (three) times daily. 0.3mg  AM and 0.6mg  PM    Yes Historical Provider, MD  diclofenac sodium (VOLTAREN) 1 % GEL Apply 2 g topically 4 (four) times daily as needed (JOINT PAIN).    Yes Historical Provider, MD  gabapentin (NEURONTIN) 600 MG tablet Take 600 mg by mouth 2 (two) times daily.   Yes Historical Provider, MD  glimepiride (AMARYL) 4 MG tablet Take 4 mg by mouth daily with breakfast.  08/23/15  Yes Historical Provider, MD  glipiZIDE (GLUCOTROL XL) 5 MG 24 hr tablet Take 5 mg by mouth  daily with breakfast.   Yes Historical Provider, MD  latanoprost (XALATAN) 0.005 % ophthalmic solution Place 1 drop into both eyes at bedtime. 08/03/15  Yes Historical Provider, MD  naproxen sodium (ANAPROX) 220 MG tablet Take 220 mg by mouth 2 (two) times daily with a meal.   Yes Historical Provider, MD  pravastatin (PRAVACHOL) 80 MG tablet Take 80 mg by mouth at bedtime. 07/10/15  Yes Historical Provider, MD  PRESCRIPTION MEDICATION Apply 1 application topically daily as needed (JOINT PAIN). CARE ALL   Yes Historical Provider, MD  sertraline (ZOLOFT) 100 MG tablet Take 100 mg by mouth daily.   Yes Historical Provider, MD  tetrahydrozoline-zinc (VISINE-AC) 0.05-0.25 % ophthalmic solution Place 2 drops into both eyes 3 (three) times daily as needed (DRY EYES).   Yes Historical Provider, MD  HYDROcodone-acetaminophen (NORCO/VICODIN) 5-325 MG tablet TAKE 1 TABLET EVERY 8 HOURS AS MEED FOR PAIN 10/13/15   Historical Provider, MD  lisinopril-hydrochlorothiazide (PRINZIDE,ZESTORETIC) 20-25 MG per tablet Take 1 tablet by mouth daily.    Historical Provider, MD  traMADol (ULTRAM) 50 MG tablet Take 1 tablet (50 mg total) by mouth every 6 (six) hours as needed. Patient not taking: Reported on 10/30/2015 10/15/15   Junius Creamer, NP    Anti-infectives    Start     Dose/Rate Route Frequency Ordered Stop   11/09/15 1041  ceFAZolin (ANCEF) IVPB 2g/100 mL premix  Status:  Discontinued     2 g 200 mL/hr over 30 Minutes Intravenous On call to O.R. 11/09/15 1041 11/09/15 1551      Scheduled Meds: . allopurinol  100 mg Oral BID  . clonazePAM  1 mg Oral QHS  . enoxaparin (LOVENOX) injection  30 mg Subcutaneous Q24H  . gabapentin  600 mg Oral BID  . insulin aspart  0-15 Units Subcutaneous TID WC  . insulin glargine  6 Units Subcutaneous Daily  . sertraline  100 mg Oral Daily   Continuous Infusions: . 0.45 % NaCl with KCl 20 mEq / L 50 mL/hr at 11/09/15 1633   PRN Meds:.HYDROcodone-acetaminophen, ondansetron  **OR** ondansetron (ZOFRAN) IV  Allergies  Allergen Reactions  . Codeine Swelling    Physical Exam  Vitals  Blood pressure (!) 154/79, pulse (!) 44, temperature 97.7 F (36.5 C), resp. rate 12, height 5\' 2"  (1.575 m), weight 81.4 kg (179 lb 9 oz), SpO2 100 %.   1. General Frail elderly female lying in bed in NAD,    2. Normal affect and insight, Not  Suicidal or Homicidal, Awake Alert, Oriented X 3.  3. No F.N deficits, ALL C.Nerves Intact, Strength 5/5 all 4 extremities, Sensation intact all 4 extremities, Plantars down going.  4. Ears and Eyes appear Normal, Conjunctivae clear, PERRLA. Moist Oral Mucosa.  5. Supple Neck, No JVD, No cervical lymphadenopathy appriciated, No Carotid Bruits.  6. Symmetrical Chest wall movement, Good air movement bilaterally, CTAB.  7. Bradycardic, No Gallops, Rubs or Murmurs, No Parasternal Heave.  8. Positive Bowel Sounds, Abdomen Soft, No tenderness, No organomegaly appriciated,No rebound -guarding or rigidity.  9.  No Cyanosis, Normal Skin Turgor, No Skin Rash or Bruise.  10. Good muscle tone,  joints appear normal , no effusions, Normal ROM.  11. No Palpable Lymph Nodes in Neck or Axillae Data Review  CBC  Recent Labs Lab 11/06/15 1130 11/09/15 1408  WBC 4.7 4.7  HGB 11.9* 10.9*  HCT 38.1 33.6*  PLT 232 207  MCV 86.2 83.0  MCH 26.9 26.9  MCHC 31.2 32.4  RDW 14.2 14.0   ------------------------------------------------------------------------------------------------------------------  Chemistries   Recent Labs Lab 11/06/15 1130 11/09/15 1408  NA 136 144  K 4.1 3.6  CL 98* 107  CO2 26 29  GLUCOSE 528* 181*  BUN 38* 40*  CREATININE 1.68* 1.59*  CALCIUM 10.5* 10.1  AST  --  20  ALT  --  17  ALKPHOS  --  105  BILITOT  --  0.7   ------------------------------------------------------------------------------------------------------------------ estimated creatinine clearance is 33.9 mL/min (by C-G formula based on SCr  of 1.59 mg/dL (H)). ------------------------------------------------------------------------------------------------------------------ No results for input(s): TSH, T4TOTAL, T3FREE, THYROIDAB in the last 72 hours.  Invalid input(s): FREET3   Coagulation profile No results for input(s): INR, PROTIME in the last 168 hours. ------------------------------------------------------------------------------------------------------------------- No results for input(s): DDIMER in the last 72 hours. -------------------------------------------------------------------------------------------------------------------  Cardiac Enzymes  Recent Labs Lab 11/09/15 1408  CKMB 1.2  TROPONINI 0.03*   ------------------------------------------------------------------------------------------------------------------ Invalid input(s): POCBNP   ---------------------------------------------------------------------------------------------------------------  Urinalysis    Component Value Date/Time   COLORURINE YELLOW 08/29/2015 1111   APPEARANCEUR CLEAR 08/29/2015 1111   LABSPEC 1.017 08/29/2015 1111   PHURINE 5.5 08/29/2015 1111   GLUCOSEU 100 (A) 08/29/2015 1111   HGBUR NEGATIVE 08/29/2015 1111   HGBUR negative 07/07/2009 0957   BILIRUBINUR NEGATIVE 08/29/2015 1111   KETONESUR NEGATIVE 08/29/2015 1111   PROTEINUR 30 (A) 08/29/2015 1111   UROBILINOGEN 1.0 10/01/2013 0915   NITRITE NEGATIVE 08/29/2015 1111   LEUKOCYTESUR SMALL (A) 08/29/2015 1111     Imaging results:   No results found.  My personal review of EKG: Rhythm Sinus bradycardia, Rate 49  /min, QTc , no Acute ST changes    Assessment & Plan  Principal Problem:   Hyperparathyroidism, primary ([place]) Active Problems:   Arrhythmia   Cardiac arrhythmia  Bradycardia - In OR after anesthesia induction and intubation, she received propofol and succinylcholine preop symptoms, received glycopyrrolate with atropine with no  improvement. - Currently improved, heart rate in the 40s, monitor on telemetry - Management per cardiology, cycle cardiac enzymes, follow on 2-D echo, AV nodal medications has been hold including atenolol, and clonidine, plan to discontinue atenolol, and taper away the clonidine, and watch  for rebound hypertension   Hypertension - Resume amlodipine, stopped atenolol, clonidine dose has been decreased from 0.3 mg 3 times a day to 0.1 3 times a day, watch for rebound hypertension, will add when necessary hydralazine  Diabetes mellitus - appears to be poorly controlled, Hemoglobin A1c 11.5 - We'll hold oral hypoglycemic agent,  will continue with insulin sliding scale and will start on Lantus 6 units daily adjust as needed  CKD stage III - Creatinine at baseline  Hyperparathyroidism primary - Awaiting parathyroidectomy  Depression/anxiety - Continue with home medication  DVT Prophylaxis Lovenox - SCDs   AM Labs Ordered, also please review Full Order   Thank you for the consult, we will follow the patient with you in the Hospital.   [facility], Djon Tith M.D on 11/09/2015 at 5:17 PM  Between 7am to 7pm - Pager - [phone]  After 7pm go to [email or web address] - password [email or web address]   Thank you for the consult, we will follow the patient with you in the [facility]   Office  [phone]

## 2015-11-09 NOTE — Interval H&P Note (Signed)
History and Physical Interval Note:  11/09/2015 12:55 PM  Caitlin Morris  has presented today for surgery, with the diagnosis of primary hyperparathyroidism.  The various methods of treatment have been discussed with the patient and family. After consideration of risks, benefits and other options for treatment, the patient has consented to    Procedure(s): LEFT INFERIOR PARATHYROIDECTOMY (Left) as a surgical intervention .    The patient's history has been reviewed, patient examined, no change in status, stable for surgery.  I have reviewed the patient's chart and labs.  Questions were answered to the patient's satisfaction.    Earnstine Regal, MD, [facility] Office: [place]

## 2015-11-10 ENCOUNTER — Inpatient Hospital Stay (HOSPITAL_COMMUNITY): Payer: Medicare Other

## 2015-11-10 DIAGNOSIS — F418 Other specified anxiety disorders: Secondary | ICD-10-CM | POA: Diagnosis not present

## 2015-11-10 DIAGNOSIS — R9431 Abnormal electrocardiogram [ECG] [EKG]: Secondary | ICD-10-CM

## 2015-11-10 DIAGNOSIS — I10 Essential (primary) hypertension: Secondary | ICD-10-CM | POA: Diagnosis present

## 2015-11-10 DIAGNOSIS — N183 Chronic kidney disease, stage 3 unspecified: Secondary | ICD-10-CM | POA: Diagnosis present

## 2015-11-10 DIAGNOSIS — I495 Sick sinus syndrome: Secondary | ICD-10-CM

## 2015-11-10 DIAGNOSIS — R001 Bradycardia, unspecified: Principal | ICD-10-CM

## 2015-11-10 DIAGNOSIS — E210 Primary hyperparathyroidism: Secondary | ICD-10-CM | POA: Diagnosis not present

## 2015-11-10 DIAGNOSIS — E1121 Type 2 diabetes mellitus with diabetic nephropathy: Secondary | ICD-10-CM

## 2015-11-10 DIAGNOSIS — IMO0002 Reserved for concepts with insufficient information to code with codable children: Secondary | ICD-10-CM | POA: Diagnosis present

## 2015-11-10 DIAGNOSIS — E1142 Type 2 diabetes mellitus with diabetic polyneuropathy: Secondary | ICD-10-CM

## 2015-11-10 DIAGNOSIS — E1165 Type 2 diabetes mellitus with hyperglycemia: Secondary | ICD-10-CM

## 2015-11-10 DIAGNOSIS — E1140 Type 2 diabetes mellitus with diabetic neuropathy, unspecified: Secondary | ICD-10-CM

## 2015-11-10 LAB — BASIC METABOLIC PANEL
ANION GAP: 6 (ref 5–15)
BUN: 36 mg/dL — ABNORMAL HIGH (ref 6–20)
CHLORIDE: 107 mmol/L (ref 101–111)
CO2: 28 mmol/L (ref 22–32)
Calcium: 10 mg/dL (ref 8.9–10.3)
Creatinine, Ser: 1.35 mg/dL — ABNORMAL HIGH (ref 0.44–1.00)
GFR calc non Af Amer: 40 mL/min — ABNORMAL LOW (ref >60)
GFR, EST AFRICAN AMERICAN: 46 mL/min — AB (ref >60)
GLUCOSE: 236 mg/dL — AB (ref 65–99)
POTASSIUM: 3.7 mmol/L (ref 3.5–5.1)
Sodium: 141 mmol/L (ref 135–145)

## 2015-11-10 LAB — CBC
HEMATOCRIT: 34.4 % — AB (ref 36.0–46.0)
HEMOGLOBIN: 10.7 g/dL — AB (ref 12.0–15.0)
MCH: 27 pg (ref 26.0–34.0)
MCHC: 31.1 g/dL (ref 30.0–36.0)
MCV: 86.9 fL (ref 78.0–100.0)
Platelets: 210 10*3/uL (ref 150–400)
RBC: 3.96 MIL/uL (ref 3.87–5.11)
RDW: 14.5 % (ref 11.5–15.5)
WBC: 4.5 10*3/uL (ref 4.0–10.5)

## 2015-11-10 LAB — ECHOCARDIOGRAM COMPLETE
Height: 62 in
Weight: 2873 oz

## 2015-11-10 LAB — GLUCOSE, CAPILLARY
Glucose-Capillary: 246 mg/dL — ABNORMAL HIGH (ref 65–99)
Glucose-Capillary: 145 mg/dL — ABNORMAL HIGH (ref 65–99)
Glucose-Capillary: 161 mg/dL — ABNORMAL HIGH (ref 65–99)
Glucose-Capillary: 246 mg/dL — ABNORMAL HIGH (ref 65–99)

## 2015-11-10 MED ORDER — LIVING WELL WITH DIABETES BOOK
Freq: Once | Status: AC
  Administered 2015-11-10: 09:00:00
  Filled 2015-11-10: qty 1

## 2015-11-10 MED ORDER — CAPSAICIN 0.025 % EX CREA
TOPICAL_CREAM | Freq: Two times a day (BID) | CUTANEOUS | Status: DC
  Administered 2015-11-10: 11:00:00 via TOPICAL
  Filled 2015-11-10: qty 56.6

## 2015-11-10 MED ORDER — LISINOPRIL 20 MG PO TABS
20.0000 mg | ORAL_TABLET | Freq: Every day | ORAL | Status: DC
  Administered 2015-11-10 – 2015-11-11 (×2): 20 mg via ORAL
  Filled 2015-11-10 (×2): qty 1

## 2015-11-10 MED ORDER — HYDROCHLOROTHIAZIDE 25 MG PO TABS
25.0000 mg | ORAL_TABLET | Freq: Every day | ORAL | Status: DC
  Administered 2015-11-10 – 2015-11-11 (×2): 25 mg via ORAL
  Filled 2015-11-10 (×2): qty 1

## 2015-11-10 MED ORDER — HYDRALAZINE HCL 25 MG PO TABS
25.0000 mg | ORAL_TABLET | Freq: Three times a day (TID) | ORAL | Status: DC
  Administered 2015-11-10 – 2015-11-11 (×3): 25 mg via ORAL
  Filled 2015-11-10 (×3): qty 1

## 2015-11-10 MED ORDER — CLONIDINE HCL 0.1 MG PO TABS
0.1000 mg | ORAL_TABLET | Freq: Three times a day (TID) | ORAL | Status: DC
  Administered 2015-11-10: 0.1 mg via ORAL
  Filled 2015-11-10: qty 1

## 2015-11-10 MED ORDER — INSULIN STARTER KIT- PEN NEEDLES (ENGLISH)
1.0000 | Freq: Once | Status: AC
  Administered 2015-11-10: 1
  Filled 2015-11-10: qty 1

## 2015-11-10 MED ORDER — INSULIN ASPART 100 UNIT/ML ~~LOC~~ SOLN
0.0000 [IU] | Freq: Three times a day (TID) | SUBCUTANEOUS | Status: DC
  Administered 2015-11-10: 3 [IU] via SUBCUTANEOUS
  Administered 2015-11-10: 2 [IU] via SUBCUTANEOUS
  Administered 2015-11-10: 5 [IU] via SUBCUTANEOUS
  Administered 2015-11-11: 3 [IU] via SUBCUTANEOUS
  Administered 2015-11-11: 5 [IU] via SUBCUTANEOUS

## 2015-11-10 MED ORDER — INSULIN GLARGINE 100 UNIT/ML ~~LOC~~ SOLN
15.0000 [IU] | Freq: Every day | SUBCUTANEOUS | Status: DC
  Administered 2015-11-10: 15 [IU] via SUBCUTANEOUS
  Filled 2015-11-10 (×2): qty 0.15

## 2015-11-10 MED ORDER — CLONIDINE HCL 0.1 MG PO TABS
0.0500 mg | ORAL_TABLET | Freq: Two times a day (BID) | ORAL | Status: DC
  Filled 2015-11-10: qty 1

## 2015-11-10 MED ORDER — SERTRALINE HCL 50 MG PO TABS
100.0000 mg | ORAL_TABLET | Freq: Every day | ORAL | Status: DC
  Administered 2015-11-10 – 2015-11-11 (×2): 100 mg via ORAL
  Filled 2015-11-10 (×3): qty 2

## 2015-11-10 MED ORDER — LISINOPRIL-HYDROCHLOROTHIAZIDE 20-25 MG PO TABS: 1.0000 | ORAL_TABLET | Freq: Every day | ORAL | Status: DC

## 2015-11-10 MED ORDER — INSULIN ASPART 100 UNIT/ML ~~LOC~~ SOLN
5.0000 [IU] | Freq: Three times a day (TID) | SUBCUTANEOUS | Status: DC
  Administered 2015-11-10 (×3): 5 [IU] via SUBCUTANEOUS

## 2015-11-10 NOTE — Progress Notes (Signed)
Inpatient Diabetes Program Recommendations  AACE/ADA: New Consensus Statement on Inpatient Glycemic Control (2015)  Target Ranges:  Prepandial:   less than 140 mg/dL      Peak postprandial:   less than 180 mg/dL (1-2 hours)      Critically ill patients:  140 - 180 mg/dL   Lab Results  Component Value Date   GLUCAP 246 (H) 11/10/2015   HGBA1C 11.5 (H) 11/06/2015    Spoke with patient and family at bedside about diabetes and home regimen for diabetes control. Patient reports that she is [place], for diabetes management. Patient reports that she just saw her PCP. He did not mention anything about DM and she does not think he even checked. Patient states that she checks her glucose 1-2 times a day but does not have a meter with a working meter. She reports her glucose usually runs in the 100's mg/dl in the morning and goes up in the 200's mg/dl in the evening.  Inquired about prior A1C and patient reports that she does not recall what an A1c is. Discussed A1C results (11.5% on 11/06/15). Discussed glucose and A1C goals. Discussed importance of checking CBGs and maintaining good CBG control to prevent long-term and short-term complications. Explained how hyperglycemia leads to damage within blood vessels which lead to the common complications seen with uncontrolled diabetes. Discussed impact of nutrition, exercise, stress, sickness, and medications on diabetes control. Patient reports she is doing well with her diet. Spoke with patient about speaking with her PCP about medication adjustment and checking on her glucose levels. Patient and family verbalized understanding of information discussed and have no further questions at this time related to diabetes.  Note see consult for insulin teaching. Patient will be new to insulin. Will follow patient for further teaching.  Thanks,  Tama Headings RN, MSN, [facility] Inpatient Diabetes Coordinator Team Pager [phone]  (8a-5p)

## 2015-11-10 NOTE — Progress Notes (Signed)
Patient Name: KERRIN FRANEY Date of Encounter: 11/10/2015  Primary Cardiologist: Reola Calkins [facility])  [facility] Problem List     Principal Problem:   Hyperparathyroidism, primary [facility]) Active Problems:   Arrhythmia   Cardiac arrhythmia     Subjective   No complaints. Feeling fine. No dizziness or weakness. Family at bedside.   Inpatient Medications    Scheduled Meds: . allopurinol  100 mg Oral BID  . amLODipine  10 mg Oral Daily  . clonazePAM  1 mg Oral QHS  . cloNIDine  0.1 mg Oral TID  . enoxaparin (LOVENOX) injection  30 mg Subcutaneous Q24H  . gabapentin  600 mg Oral BID  . insulin aspart  0-15 Units Subcutaneous TID WC  . insulin aspart  5 Units Subcutaneous TID WC  . insulin glargine  15 Units Subcutaneous Daily  . sertraline  100 mg Oral Daily   Continuous Infusions: . 0.45 % NaCl with KCl 20 mEq / L 50 mL/hr at 11/09/15 1633   PRN Meds: hydrALAZINE, HYDROcodone-acetaminophen, ondansetron **OR** ondansetron (ZOFRAN) IV   Vital Signs    Vitals:   11/09/15 1543 11/09/15 2016 11/10/15 0200 11/10/15 0633  BP: (!) 154/79 (!) 148/81 (!) 146/85 (!) 147/90  Pulse: (!) 44 (!) 48 (!) 47 (!) 46  Resp: 12 15 14 16   Temp: 97.7 F (36.5 C) 97.9 F (36.6 C) 98 F (36.7 C) 98.1 F (36.7 C)  TempSrc:  Oral Oral Oral  SpO2: 100% 100% 100% 100%  Weight:      Height:        Intake/Output Summary (Last 24 hours) at 11/10/15 0807 Last data filed at 11/10/15 0600  Gross per 24 hour  Intake           1932.5 ml  Output                1 ml  Net           1931.5 ml   Filed Weights   11/09/15 1142  Weight: 179 lb 9 oz (81.4 kg)    Physical Exam    GEN: Well nourished, well developed, in no acute distress. obese HEENT: Grossly normal.  Neck: Supple, no JVD, carotid bruits, or masses. Cardiac: RRR, + soft murmur, rubs, or gallops. No clubbing, cyanosis, edema.  Radials/DP/PT 2+ and equal bilaterally.  Respiratory:  Respirations regular and unlabored, clear to  auscultation bilaterally. GI: Soft, nontender, nondistended, BS + x 4. MS: no deformity or atrophy. Skin: warm and dry, no rash. Neuro:  Strength and sensation are intact. Psych: AAOx3.  Normal affect.  Labs    CBC  Recent Labs  11/09/15 1408 11/10/15 0434  WBC 4.7 4.5  HGB 10.9* 10.7*  HCT 33.6* 34.4*  MCV 83.0 86.9  PLT 207 [id number]   Basic Metabolic Panel  Recent Labs  11/09/15 1408 11/10/15 0434  NA 144 141  K 3.6 3.7  CL 107 107  CO2 29 28  GLUCOSE 181* 236*  BUN 40* 36*  CREATININE 1.59* 1.35*  CALCIUM 10.1 10.0   Liver Function Tests  Recent Labs  11/09/15 1408  AST 20  ALT 17  ALKPHOS 105  BILITOT 0.7  PROT 7.3  ALBUMIN 3.7   No results for input(s): LIPASE, AMYLASE in the last 72 hours. Cardiac Enzymes  Recent Labs  11/09/15 1408  CKTOTAL 62  CKMB 1.2  TROPONINI 0.03*   BNP Invalid input(s): POCBNP D-Dimer No results for input(s): DDIMER in the last 72 hours. Hemoglobin  A1C No results for input(s): HGBA1C in the last 72 hours. Fasting Lipid Panel No results for input(s): CHOL, HDL, LDLCALC, TRIG, CHOLHDL, LDLDIRECT in the last 72 hours. Thyroid Function Tests No results for input(s): TSH, T4TOTAL, T3FREE, THYROIDAB in the last 72 hours.  Invalid input(s): FREET3  Telemetry    Sinus brady with bigeminy - Personally Reviewed  ECG    Sinus bradycardia with first degree AV block: HR 49  - Personally Reviewed  Radiology    No results found.  Cardiac Studies   2D ECHO to be done today  Patient Profile     SAMAURIA GLAAB is a 65 y.o. female with a history of DMT2, HTN, CKD, primary hyperparathyroidism and HLD who was admitted to [facility] on 11/09[facility]15 for planned left inferior parathyroidectomy which was canceled by Dr. Orene Desanctis secondary to bradycardia with escape PVCs. Cardiology consulted for help in management of her bradyarrhythmia.    Assessment & Plan    Bradycardia: prior ECGs show marked sinus brady back to 08/2015. During  induction yesterday, there was significant bradycardia and loss of P waves were noted. Home atenolol 50mg  was discontinued and clonidine 0.3 TID is being weaned to 0.1mg  TID. HR stable in 40-50s with ventricular bigeminy. 2D ECHO to be done today to assess cardiac structure and function.   Primary hyperparathyroidism: per Dr. Harlow Asa, will plan to reschedule parathyroidectomy in 4-6 weeks (not this admission)  HTN: BP elevated with stopping atenolol and decreasing clonidine. Home Prinzide 20-25mg  currently held, will resume this. Continue amlodipine 10mg  daily. Continue to monitor.   Poorly controlled DMT2: HgA1c 11.5   CKD: creat stable around 1.35  HLD: continue statin  Murmur: as above, 2D ECHO pending  Ventriclar ectopy: BB stopped in the setting of bradycardia. Asymptomatic. Await 2D ECHO  Signed, Angelena Form, PA-C  11/10/2015, 8:07 AM   Personally seen and examined. Agree with above. Await ECHO Stopped atenolol 50 Will stop clonidine Will add hydralazine 25 TID HR mildly improved  Was 41bpm on August ECG as well. Fairly chronic PVC's contribute to even lower effective HR when in bigeminy pattern.  Plan from Dr. Harlow Asa is to perform surgery in 4-6 weeks.   Candee Furbish, MD

## 2015-11-10 NOTE — Progress Notes (Signed)
PROGRESS NOTE    [name]  [id number]  DOB: 31-Mar-1950  DOA: 11/09/2015 PCP: Benito Mccreedy, MD Outpatient Specialists:   Hospital course: Caitlin Morris  is a 65 y.o. female, With past medical history of diabetes mellitus, poorly controlled, primary hyperparathyroidism, depression, anxiety, hypertension, patient presents to or today for elective parathyroidectomy for primary hyperparathyroidism, after anesthesia induction, intubation, she was on propofol, received succinyl choline, patient was noticed to have bradycardia with a heart rate in the 20s, so surgery was canceled, and patient transferred to telemetry floor for further evaluation, patient with known history of diabetes mellitus, poorly controlled, A1c 11.5 last week, she is on atenolol and clonidine and amlodipine for hypertension, EKG in the past showing bradycardia with heart rate in the 40s, she denies any chest pain, dyspnea, shortness of breath, dizziness or lightheadedness or syncopal episode.  Assessment & Plan:   Bradycardia - In OR after anesthesia induction and intubation, she received propofol and succinylcholine preop symptoms, received glycopyrrolate with atropine with no improvement. - Currently improved, heart rate in the 40s, monitor on telemetry - Management per cardiology, cycle cardiac enzymes, follow on 2-D echo, AV nodal medications has been hold including atenolol, and clonidine, plan to discontinue atenolol, and taper away the clonidine, and watch  for rebound hypertension  Hypertension - Resume amlodipine, stopped atenolol, clonidine dose has been decreased from 0.3 mg 3 times a day to 0.1 3 times a day, watch for rebound hypertension, will add when necessary hydralazine  Diabetes mellitus, type 2 uncontrolled with neurological complications - appears to be poorly controlled, Hemoglobin A1c 11.5, Pt will need to be discharged on insulin, will consult diabetes coordinator to assist with  the education for home insulin administration.  - Started on basal bolus supplemental therapy program.   Increase lantus to 15 units, novolog 5 units TIDAC plus supplemental coverage, titrate daily for better glycemic control.     Painful Diabetic peripheral neuropathy - unfortunately this will likely not get better until diabetes is much better controlled, will try topical creme therapy for now.    CKD stage III - Creatinine at baseline  Hyperparathyroidism primary - Awaiting parathyroidectomy, surgeon to delay 4-6 weeks so that her medical problems can be optimized.  Depression/anxiety - Continue with home medication  DVT Prophylaxis Lovenox - SCDs   AM Labs Ordered, also please review Full Order   Thank you for the consult, we will follow the patient with you in the Hospital.  Subjective: Pt complains of painful diabetic foot neuropathy pain.   Objective: Vitals:   11/09/15 1543 11/09/15 2016 11/10/15 0200 11/10/15 0633  BP: (!) 154/79 (!) 148/81 (!) 146/85 (!) 147/90  Pulse: (!) 44 (!) 48 (!) 47 (!) 46  Resp: 12 15 14 16   Temp: 97.7 F (36.5 C) 97.9 F (36.6 C) 98 F (36.7 C) 98.1 F (36.7 C)  TempSrc:  Oral Oral Oral  SpO2: 100% 100% 100% 100%  Weight:      Height:        Intake/Output Summary (Last 24 hours) at 11/10/15 0849 Last data filed at 11/10/15 0800  Gross per 24 hour  Intake           2172.5 ml  Output                1 ml  Net           2171.5 ml   Filed Weights   11/09/15 1142  Weight: 81.4 kg (179 lb 9 oz)  Exam:  General exam: awake, alert, NAD, cooperative Respiratory system: clear, No increased work of breathing. Cardiovascular system: S1 & S2 heard. No JVD, murmurs, gallops, clicks or pedal edema. Gastrointestinal system: Abdomen is nondistended, soft and nontender. Normal bowel sounds heard. Central nervous system: Alert and oriented. No focal neurological deficits. Extremities: no CCE.  Data Reviewed: Basic Metabolic  Panel:  Recent Labs Lab 11/06/15 1130 11/09/15 1408 11/10/15 0434  NA 136 144 141  K 4.1 3.6 3.7  CL 98* 107 107  CO2 26 29 28   GLUCOSE 528* 181* 236*  BUN 38* 40* 36*  CREATININE 1.68* 1.59* 1.35*  CALCIUM 10.5* 10.1 10.0   Liver Function Tests:  Recent Labs Lab 11/09/15 1408  AST 20  ALT 17  ALKPHOS 105  BILITOT 0.7  PROT 7.3  ALBUMIN 3.7   No results for input(s): LIPASE, AMYLASE in the last 168 hours. No results for input(s): AMMONIA in the last 168 hours. CBC:  Recent Labs Lab 11/06/15 1130 11/09/15 1408 11/10/15 0434  WBC 4.7 4.7 4.5  HGB 11.9* 10.9* 10.7*  HCT 38.1 33.6* 34.4*  MCV 86.2 83.0 86.9  PLT 232 207 210   Cardiac Enzymes:  Recent Labs Lab 11/09/15 1408  CKTOTAL 62  CKMB 1.2  TROPONINI 0.03*   CBG (last 3)   Recent Labs  11/09/15 1649 11/09/15 2048 11/10/15 0821  GLUCAP 149* 274* 246*   No results found for this or any previous visit (from the past 240 hour(s)).   Studies: No results found.  Scheduled Meds: . allopurinol  100 mg Oral BID  . amLODipine  10 mg Oral Daily  . clonazePAM  1 mg Oral QHS  . cloNIDine  0.1 mg Oral TID  . enoxaparin (LOVENOX) injection  30 mg Subcutaneous Q24H  . gabapentin  600 mg Oral BID  . insulin aspart  0-15 Units Subcutaneous TID WC  . insulin aspart  5 Units Subcutaneous TID WC  . insulin glargine  15 Units Subcutaneous Daily  . lisinopril-hydrochlorothiazide  1 tablet Oral Daily  . living well with diabetes book   Does not apply Once  . sertraline  100 mg Oral Daily   Continuous Infusions: . 0.45 % NaCl with KCl 20 mEq / L 50 mL/hr at 11/09/15 1633    Principal Problem:   Hyperparathyroidism, primary [facility]) Active Problems:   Arrhythmia   Cardiac arrhythmia  Time spent:   [name], MD, FAAFP [facility] Pager [phone]  If 7PM-7AM, please contact night-coverage [email or web address] Password [email or web address] 11/10/2015, 8:49 AM    LOS: 1 day

## 2015-11-10 NOTE — Progress Notes (Signed)
  Echocardiogram 2D Echocardiogram has been performed.  Tresa Res 11/10/2015, 12:23 PM

## 2015-11-10 NOTE — Progress Notes (Signed)
  General Surgery [facility]  11/10/2015  Assessment & Plan: Sinus bradycardia with PVC's Elevated troponin level Primary hyperparathyroidism  Stable this AM on telemetry unit  Awaiting cardiac Echol exam  Appreciate medical team assistance  Home when stable from medical and cardiac standpoint  Will plan to reschedule parathyroidectomy in 4-6 weeks (not this admission)        Earnstine Regal, MD, [facility]       Office: [phone]    Subjective: Patient up in bed, alert, comfortable.  Family at bedside.  On telemetry.  Objective: Vital signs in last 24 hours: Temp:  [97.5 F (36.4 C)-98.1 F (36.7 C)] 98.1 F (36.7 C) (10/17 [id number]) Pulse Rate:  [40-49] 46 (10/17 0633) Resp:  [12-16] 16 (10/17 0633) BP: (111-158)/(71-136) 147/90 (10/17 0633) SpO2:  [98 %-100 %] 100 % (10/17 [id number]) Weight:  [81.4 kg (179 lb 9 oz)] 81.4 kg (179 lb 9 oz) (10/16 1142) Last BM Date: 11/08/15  Intake/Output from previous day: 10/16 0701 - 10/17 0700 In: 1932.5 [P.O.:360; I.V.:1572.5] Out: 1 [Urine:1] Intake/Output this shift: No intake/output data recorded.  Physical Exam: HEENT - sclerae clear, mucous membranes moist Neck - soft Chest - clear bilaterally Cor - slow but regular, no murmur Neuro - alert & oriented, no focal deficits  Lab Results:   Recent Labs  11/09/15 1408 11/10/15 0434  WBC 4.7 4.5  HGB 10.9* 10.7*  HCT 33.6* 34.4*  PLT 207 210   BMET  Recent Labs  11/09/15 1408 11/10/15 0434  NA 144 141  K 3.6 3.7  CL 107 107  CO2 29 28  GLUCOSE 181* 236*  BUN 40* 36*  CREATININE 1.59* 1.35*  CALCIUM 10.1 10.0   PT/INR No results for input(s): LABPROT, INR in the last 72 hours. Comprehensive Metabolic Panel:    Component Value Date/Time   NA 141 11/10/2015 0434   NA 144 11/09/2015 1408   K 3.7 11/10/2015 0434   K 3.6 11/09/2015 1408   CL 107 11/10/2015 0434   CL 107 11/09/2015 1408   CO2 28  11/10/2015 0434   CO2 29 11/09/2015 1408   BUN 36 (H) 11/10/2015 0434   BUN 40 (H) 11/09/2015 1408   CREATININE 1.35 (H) 11/10/2015 0434   CREATININE 1.59 (H) 11/09/2015 1408   GLUCOSE 236 (H) 11/10/2015 0434   GLUCOSE 181 (H) 11/09/2015 1408   CALCIUM 10.0 11/10/2015 0434   CALCIUM 10.1 11/09/2015 1408   CALCIUM 11.0 (H) 09/25/2007 2155   AST 20 11/09/2015 1408   AST 40 (H) 10/03/2013 0640   ALT 17 11/09/2015 1408   ALT 14 10/03/2013 0640   ALKPHOS 105 11/09/2015 1408   ALKPHOS 78 10/03/2013 0640   BILITOT 0.7 11/09/2015 1408   BILITOT 0.6 10/03/2013 0640   PROT 7.3 11/09/2015 1408   PROT 6.3 10/03/2013 0640   ALBUMIN 3.7 11/09/2015 1408   ALBUMIN 2.8 (L) 10/03/2013 0640    Studies/Results: No results found.    Keerstin Bjelland M 11/10/2015  Patient ID: Caitlin Morris, female   DOB: 04/16/1950, 65 y.o.   MRN: [id number]

## 2015-11-10 NOTE — Care Management Note (Signed)
Case Management Note  Patient Details  Name: Caitlin Morris MRN: [id number] Date of Birth: 12/03/1950  Subjective/Objective:  Noted surgery was cancelled d/t bradycardia,& htn.                  Action/Plan:d/c plan home   Expected Discharge Date:                  Expected Discharge Plan:  Home/Self Care  In-House Referral:     Discharge planning Services  CM Consult  Post Acute Care Choice:    Choice offered to:     DME Arranged:    DME Agency:     HH Arranged:    HH Agency:     Status of Service:  In process, will continue to follow  If discussed at Long Length of Stay Meetings, dates discussed:    Additional Comments:  Dessa Phi, RN 11/10/2015, 2:27 PM

## 2015-11-10 NOTE — Care Management Note (Signed)
Case Management Note  Patient Details  Name: Caitlin Morris MRN: [id number] Date of Birth: [birth year]  Subjective/Objective:  65 y/o f admitted w/Hyperparathyroidism. From home.POD#1 parathyroidectomy, bradycardia,htn post procedure.                  Action/Plan:d/c plan home.   Expected Discharge Date:                  Expected Discharge Plan:  Home/Self Care  In-House Referral:     Discharge planning Services  CM Consult  Post Acute Care Choice:    Choice offered to:     DME Arranged:    DME Agency:     HH Arranged:    HH Agency:     Status of Service:  In process, will continue to follow  If discussed at Long Length of Stay Meetings, dates discussed:    Additional Comments:  Dessa Phi, RN 11/10/2015, 12:25 PM

## 2015-11-10 NOTE — Progress Notes (Signed)
Inpatient Diabetes Program Recommendations  AACE/ADA: New Consensus Statement on Inpatient Glycemic Control (2015)  Target Ranges:  Prepandial:   less than 140 mg/dL      Peak postprandial:   less than 180 mg/dL (1-2 hours)      Critically ill patients:  140 - 180 mg/dL   Lab Results  Component Value Date   GLUCAP 145 (H) 11/10/2015   HGBA1C 11.5 (H) 11/06/2015    Patient lives with son but administers her own medication. Educated patient and children on insulin pen use at home. Reviewed contents of insulin flexpen starter kit. Reviewed all steps if insulin pen including attachment of needle, 2-unit air shot, dialing up dose, giving injection, removing needle, disposal of sharps, storage of unused insulin, disposal of insulin etc. Patient able to provide successful return demonstration. Also reviewed troubleshooting with insulin pen.   MD to give patient Rxs for insulin pens and insulin pen needles. Patient also needs Glucose meter kit at time of discharge (order # [id number]).  Thanks,  Tama Headings RN, MSN, [facility] Inpatient Diabetes Coordinator Team Pager [phone] (8a-5p)

## 2015-11-11 DIAGNOSIS — E1140 Type 2 diabetes mellitus with diabetic neuropathy, unspecified: Secondary | ICD-10-CM | POA: Diagnosis not present

## 2015-11-11 DIAGNOSIS — E210 Primary hyperparathyroidism: Secondary | ICD-10-CM | POA: Diagnosis not present

## 2015-11-11 DIAGNOSIS — I10 Essential (primary) hypertension: Secondary | ICD-10-CM | POA: Diagnosis not present

## 2015-11-11 DIAGNOSIS — N183 Chronic kidney disease, stage 3 (moderate): Secondary | ICD-10-CM

## 2015-11-11 DIAGNOSIS — R001 Bradycardia, unspecified: Secondary | ICD-10-CM | POA: Diagnosis not present

## 2015-11-11 LAB — GLUCOSE, CAPILLARY
GLUCOSE-CAPILLARY: 174 mg/dL — AB (ref 65–99)
GLUCOSE-CAPILLARY: 220 mg/dL — AB (ref 65–99)

## 2015-11-11 MED ORDER — INSULIN GLARGINE 100 UNITS/ML SOLOSTAR PEN: 30.0000 [IU] | PEN_INJECTOR | Freq: Every day | SUBCUTANEOUS | 3 refills | Status: DC

## 2015-11-11 MED ORDER — HYDRALAZINE HCL 50 MG PO TABS
50.0000 mg | ORAL_TABLET | Freq: Three times a day (TID) | ORAL | Status: DC
  Administered 2015-11-11: 50 mg via ORAL
  Filled 2015-11-11: qty 1

## 2015-11-11 MED ORDER — FREESTYLE SYSTEM KIT: 1.0000 | PACK | Freq: Three times a day (TID) | 1 refills | Status: AC

## 2015-11-11 MED ORDER — HYDRALAZINE HCL 50 MG PO TABS: 50.0000 mg | ORAL_TABLET | Freq: Three times a day (TID) | ORAL | 0 refills | Status: DC

## 2015-11-11 MED ORDER — INSULIN PEN NEEDLE 29G X 10MM MISC: 1.0000 "application " | Freq: Every day | 0 refills | Status: DC

## 2015-11-11 MED ORDER — GLUCOSE BLOOD VI STRP: ORAL_STRIP | 12 refills | Status: AC

## 2015-11-11 MED ORDER — INSULIN ASPART 100 UNIT/ML ~~LOC~~ SOLN
8.0000 [IU] | Freq: Three times a day (TID) | SUBCUTANEOUS | Status: DC
  Administered 2015-11-11 (×2): 8 [IU] via SUBCUTANEOUS

## 2015-11-11 MED ORDER — ALCOHOL SWABS 70 % PADS: 1.0000 "application " | MEDICATED_PAD | Freq: Two times a day (BID) | 3 refills | Status: DC

## 2015-11-11 MED ORDER — INSULIN GLARGINE 100 UNIT/ML ~~LOC~~ SOLN
20.0000 [IU] | Freq: Every day | SUBCUTANEOUS | Status: DC
  Administered 2015-11-11: 20 [IU] via SUBCUTANEOUS
  Filled 2015-11-11: qty 0.2

## 2015-11-11 MED ORDER — ACCU-CHEK SOFT TOUCH LANCETS MISC: 12 refills | Status: AC

## 2015-11-11 NOTE — Care Management Note (Signed)
Case Management Note  Patient Details  Name: Caitlin Morris MRN: [id number] Date of Birth: [birth year]  Subjective/Objective:Patient-New Diabetic, recc HHRN-DM instruction. Patient chose [name] aware-await HHRN,f53f order. Nsg aware.                   Action/Plan:d/c home w/HHC.   Expected Discharge Date:                  Expected Discharge Plan:  [place]  In-House Referral:     Discharge planning Services  CM Consult  Post Acute Care Choice:    Choice offered to:  Patient  DME Arranged:    DME Agency:     HH Arranged:    [place] Agency:  [facility]  Status of Service:  In process, will continue to follow  If discussed at Long Length of Stay Meetings, dates discussed:    Additional Comments:  Dessa Phi, RN 11/11/2015, 11:03 AM

## 2015-11-11 NOTE — Progress Notes (Signed)
PROGRESS NOTE                                                                                                                                                                                                             Patient Demographics:    Caitlin Morris, is a 65 y.o. female, DOB - 1950-05-13, [id number]  Admit date - 11/09/2015   Admitting Physician Armandina Gemma, MD  Outpatient Primary MD for the patient is OSEI-BONSU,GEORGE, MD  LOS - 2    No chief complaint on file.      Brief Narrative  65 year old female with poorly controlled diabetes mellitus, hypertension, primary hyperparathyroidism, anxiety and depression admitted for elective parathyroidectomy when she was bradycardic with heart rate in the 20s shortly after receiving anesthesia and intubated. Surgery was canceled and transferred to telemetry for further management. Hospitalist consulted for management of her poorly controlled diabetes and hypertension.    Subjective:   Patient denies any symptoms. Heart rate stable on the monitor. Noted for elevated blood pressure.   Assessment  & Plan :    Principal Problem:   Type 2 diabetes mellitus, uncontrolled, with neuropathy (HCC) A1c of 11.5. Patient is on Amaryl and glipizide at home. She has been started on Lantus with pre-meal aspart while in the hospital. Patient provided with education on use of glucometer and insulin pen. Will provide resource on meal planning and blood glucose monitoring. -I'm not sure if patient will be adherent to insulin injection 4 times a day. I will put her on Lantus 30 units daily at bedtime and resume her oral hypoglycemic. Patient instructed to keep a log of her blood glucose monitoring and show it to her PCP during outpatient follow-up.   Active Problems: Sinus bradycardia Appreciate cardiology evaluation. Has chronic bradycardia, significantly low this admission.  Discontinued atenolol, clonidine weaned off. Heart rate has been stable in the 50s and 60s and patient remains asymptomatic. 2-D echo shows normal EF with no wall motion abnormality and grade 2 diastolic dysfunction.    Polyneuropathy due to type 2 diabetes mellitus (HCC) Continue Neurontin.    Essential hypertension Continue amlodipine, HCTZ and lisinopril. Added hydralazine and goes adjusted. Atenolol and clonidine have been discontinued. Follow-up with PCP.  Primary hyperparathyroidism Surgery canceled given bradycardia. Plan on elective surgery as outpatient.     Depression with anxiety Resume  home medications    CKD (chronic kidney disease) stage 3, GFR 30-59 ml/min Stable at baseline   Hyperlipidemia Continue statin.   Will sign off. Please call for any questions.    Code Status : Full code  Family Communication  : None at bedside  Disposition Plan  : Home per primary team    Procedures  : 2-D echo  DVT Prophylaxis  :  Lovenox -     Lab Results  Component Value Date   PLT 210 11/10/2015    Antibiotics  :   Anti-infectives    Start     Dose/Rate Route Frequency Ordered Stop   11/09/15 1041  ceFAZolin (ANCEF) IVPB 2g/100 mL premix  Status:  Discontinued     2 g 200 mL/hr over 30 Minutes Intravenous On call to O.R. 11/09/15 1041 11/09/15 1551        Objective:   Vitals:   11/10/15 0931 11/10/15 1510 11/10/15 2052 11/11/15 0449  BP: (!) 161/79 (!) 167/82 (!) 174/90 (!) 150/67  Pulse: (!) 49 (!) 52 (!) 53 (!) 58  Resp:  18 18 18   Temp: 98 F (36.7 C) 98.3 F (36.8 C) 98.8 F (37.1 C) 98.2 F (36.8 C)  TempSrc:  Oral Oral Oral  SpO2:  99% 98% 98%  Weight:      Height:        Wt Readings from Last 3 Encounters:  11/09/15 81.4 kg (179 lb 9 oz)  11/06/15 81.4 kg (179 lb 9 oz)  10/15/15 77.1 kg (170 lb)     Intake/Output Summary (Last 24 hours) at 11/11/15 1113 Last data filed at 11/11/15 0900  Gross per 24 hour  Intake              900  ml  Output                0 ml  Net              900 ml     Physical Exam  Gen: not in distress HEENT: moist mucosa, supple neck Chest: clear b/l, no added sounds CVS: Son and S2 bradycardic, no murmurs rub or gallop GI: soft, NT, ND,  Musculoskeletal: warm, no edema     Data Review:    CBC  Recent Labs Lab 11/06/15 1130 11/09/15 1408 11/10/15 0434  WBC 4.7 4.7 4.5  HGB 11.9* 10.9* 10.7*  HCT 38.1 33.6* 34.4*  PLT 232 207 210  MCV 86.2 83.0 86.9  MCH 26.9 26.9 27.0  MCHC 31.2 32.4 31.1  RDW 14.2 14.0 14.5    Chemistries   Recent Labs Lab 11/06/15 1130 11/09/15 1408 11/10/15 0434  NA 136 144 141  K 4.1 3.6 3.7  CL 98* 107 107  CO2 26 29 28   GLUCOSE 528* 181* 236*  BUN 38* 40* 36*  CREATININE 1.68* 1.59* 1.35*  CALCIUM 10.5* 10.1 10.0  AST  --  20  --   ALT  --  17  --   ALKPHOS  --  105  --   BILITOT  --  0.7  --    ------------------------------------------------------------------------------------------------------------------ No results for input(s): CHOL, HDL, LDLCALC, TRIG, CHOLHDL, LDLDIRECT in the last 72 hours.  Lab Results  Component Value Date   HGBA1C 11.5 (H) 11/06/2015   ------------------------------------------------------------------------------------------------------------------ No results for input(s): TSH, T4TOTAL, T3FREE, THYROIDAB in the last 72 hours.  Invalid input(s): FREET3 ------------------------------------------------------------------------------------------------------------------ No results for input(s): VITAMINB12, FOLATE, FERRITIN, TIBC, IRON, RETICCTPCT in the last 72  hours.  Coagulation profile No results for input(s): INR, PROTIME in the last 168 hours.  No results for input(s): DDIMER in the last 72 hours.  Cardiac Enzymes  Recent Labs Lab 11/09/15 1408  CKMB 1.2  TROPONINI 0.03*    ------------------------------------------------------------------------------------------------------------------ No results found for: BNP  Inpatient Medications  Scheduled Meds: . allopurinol  100 mg Oral BID  . amLODipine  10 mg Oral Daily  . capsaicin   Topical BID  . clonazePAM  1 mg Oral QHS  . enoxaparin (LOVENOX) injection  30 mg Subcutaneous Q24H  . gabapentin  600 mg Oral BID  . hydrALAZINE  50 mg Oral TID  . lisinopril  20 mg Oral Daily   And  . hydrochlorothiazide  25 mg Oral Daily  . insulin aspart  0-15 Units Subcutaneous TID WC  . insulin aspart  8 Units Subcutaneous TID WC  . insulin glargine  20 Units Subcutaneous Daily  . sertraline  100 mg Oral Daily   Continuous Infusions:  PRN Meds:.hydrALAZINE, HYDROcodone-acetaminophen, ondansetron **OR** ondansetron (ZOFRAN) IV  Micro Results No results found for this or any previous visit (from the past 240 hour(s)).  Radiology Reports Dg Ankle Complete Left  Result Date: 10/15/2015 CLINICAL DATA:  Diffuse ankle and foot swelling for 5 days. No known injury. EXAM: LEFT ANKLE COMPLETE - 3+ VIEW; LEFT FOOT - COMPLETE 3+ VIEW COMPARISON:  None. FINDINGS: Left ankle: The ankle mortise is maintained. Mild degenerative changes. No acute fracture. Probable remote avulsion fracture involving the medial malleolus. No definite ankle joint effusion. The subtalar joints appear normal. Calcaneal spurring changes are noted. Left foot: Midfoot and forefoot degenerative changes. No fracture or destructive bony changes. IMPRESSION: Degenerative changes but no acute bony findings or destructive bony changes. Diffuse soft tissue swelling/edema. Electronically Signed   By: Marijo Sanes M.D.   On: 10/15/2015 20:52   Dg Foot Complete Left  Result Date: 10/15/2015 CLINICAL DATA:  Diffuse ankle and foot swelling for 5 days. No known injury. EXAM: LEFT ANKLE COMPLETE - 3+ VIEW; LEFT FOOT - COMPLETE 3+ VIEW COMPARISON:  None. FINDINGS: Left  ankle: The ankle mortise is maintained. Mild degenerative changes. No acute fracture. Probable remote avulsion fracture involving the medial malleolus. No definite ankle joint effusion. The subtalar joints appear normal. Calcaneal spurring changes are noted. Left foot: Midfoot and forefoot degenerative changes. No fracture or destructive bony changes. IMPRESSION: Degenerative changes but no acute bony findings or destructive bony changes. Diffuse soft tissue swelling/edema. Electronically Signed   By: Marijo Sanes M.D.   On: 10/15/2015 20:52    Time Spent in minutes  25   Louellen Molder M.D on 11/11/2015 at 11:13 AM  Between 7am to 7pm - Pager - [phone]  After 7pm go to [email or web address] - password [email or web address]  [facility] -  Office  [phone]

## 2015-11-11 NOTE — Discharge Instructions (Addendum)
Diabetes Mellitus and Food It is important for you to manage your blood sugar (glucose) level. Your blood glucose level can be greatly affected by what you eat. Eating healthier foods in the appropriate amounts throughout the day at about the same time each day will help you control your blood glucose level. It can also help slow or prevent worsening of your diabetes mellitus. Healthy eating may even help you improve the level of your blood pressure and reach or maintain a healthy weight.  General recommendations for healthful eating and cooking habits include:  Eating meals and snacks regularly. Avoid going long periods of time without eating to lose weight.  Eating a diet that consists mainly of plant-based foods, such as fruits, vegetables, nuts, legumes, and whole grains.  Using low-heat cooking methods, such as baking, instead of high-heat cooking methods, such as deep frying. Work with your dietitian to make sure you understand how to use the Nutrition Facts information on food labels. HOW CAN FOOD AFFECT ME? Carbohydrates Carbohydrates affect your blood glucose level more than any other type of food. Your dietitian will help you determine how many carbohydrates to eat at each meal and teach you how to count carbohydrates. Counting carbohydrates is important to keep your blood glucose at a healthy level, especially if you are using insulin or taking certain medicines for diabetes mellitus. Alcohol Alcohol can cause sudden decreases in blood glucose (hypoglycemia), especially if you use insulin or take certain medicines for diabetes mellitus. Hypoglycemia can be a life-threatening condition. Symptoms of hypoglycemia (sleepiness, dizziness, and disorientation) are similar to symptoms of having too much alcohol.  If your health care provider has given you approval to drink alcohol, do so in moderation and use the following guidelines:  Women should not have more than one drink per day, and men  should not have more than two drinks per day. One drink is equal to:  12 oz of beer.  5 oz of wine.  1 oz of hard liquor.  Do not drink on an empty stomach.  Keep yourself hydrated. Have water, diet soda, or unsweetened iced tea.  Regular soda, juice, and other mixers might contain a lot of carbohydrates and should be counted. WHAT FOODS ARE NOT RECOMMENDED? As you make food choices, it is important to remember that all foods are not the same. Some foods have fewer nutrients per serving than other foods, even though they might have the same number of calories or carbohydrates. It is difficult to get your body what it needs when you eat foods with fewer nutrients. Examples of foods that you should avoid that are high in calories and carbohydrates but low in nutrients include:  Trans fats (most processed foods list trans fats on the Nutrition Facts label).  Regular soda.  Juice.  Candy.  Sweets, such as cake, pie, doughnuts, and cookies.  Fried foods. WHAT FOODS CAN I EAT? Eat nutrient-rich foods, which will nourish your body and keep you healthy. The food you should eat also will depend on several factors, including:  The calories you need.  The medicines you take.  Your weight.  Your blood glucose level.  Your blood pressure level.  Your cholesterol level. You should eat a variety of foods, including:  Protein.  Lean cuts of meat.  Proteins low in saturated fats, such as fish, egg whites, and beans. Avoid processed meats.  Fruits and vegetables.  Fruits and vegetables that may help control blood glucose levels, such as apples, mangoes, and   yams.  Dairy products.  Choose fat-free or low-fat dairy products, such as milk, yogurt, and cheese.  Grains, bread, pasta, and rice.  Choose whole grain products, such as multigrain bread, whole oats, and brown rice. These foods may help control blood pressure.  Fats.  Foods containing healthful fats, such as nuts,  avocado, olive oil, canola oil, and fish. DOES EVERYONE WITH DIABETES MELLITUS HAVE THE SAME MEAL PLAN? Because every person with diabetes mellitus is different, there is not one meal plan that works for everyone. It is very important that you meet with a dietitian who will help you create a meal plan that is just right for you.   This information is not intended to replace advice given to you by your health care provider. Make sure you discuss any questions you have with your health care provider.   Document Released: 10/07/2004 Document Revised: 01/31/2014 Document Reviewed: 12/07/2012 Elsevier Interactive Patient Education 2016 Elsevier Inc.  Blood Glucose Monitoring, Adult Monitoring your blood glucose (also know as blood sugar) helps you to manage your diabetes. It also helps you and your health care provider monitor your diabetes and determine how well your treatment plan is working. WHY SHOULD YOU MONITOR YOUR BLOOD GLUCOSE?  It can help you understand how food, exercise, and medicine affect your blood glucose.  It allows you to know what your blood glucose is at any given moment. You can quickly tell if you are having low blood glucose (hypoglycemia) or high blood glucose (hyperglycemia).  It can help you and your health care provider know how to adjust your medicines.  It can help you understand how to manage an illness or adjust medicine for exercise. WHEN SHOULD YOU TEST? Your health care provider will help you decide how often you should check your blood glucose. This may depend on the type of diabetes you have, your diabetes control, or the types of medicines you are taking. Be sure to write down all of your blood glucose readings so that this information can be reviewed with your health care provider. See below for examples of testing times that your health care provider may suggest. Type 1 Diabetes  Test at least 2 times per day if your diabetes is well controlled, if you are  using an insulin pump, or if you perform multiple daily injections.  If your diabetes is not well controlled or if you are sick, you may need to test more often.  It is a good idea to also test:  Before every insulin injection.  Before and after exercise.  Between meals and 2 hours after a meal.  Occasionally between 2:00 a.m. and 3:00 a.m. Type 2 Diabetes  If you are taking insulin, test at least 2 times per day. However, it is best to test before every insulin injection.  If you take medicines by mouth (orally), test 2 times a day.  If you are on a controlled diet, test once a day.  If your diabetes is not well controlled or if you are sick, you may need to monitor more often. HOW TO MONITOR YOUR BLOOD GLUCOSE Supplies Needed  Blood glucose meter.  Test strips for your meter. Each meter has its own strips. You must use the strips that go with your own meter.  A pricking needle (lancet).  A device that holds the lancet (lancing device).  A journal or log book to write down your results. Procedure  Wash your hands with soap and water. Alcohol is not preferred.  Prick the side of your finger (not the tip) with the lancet.  Gently milk the finger until a small drop of blood appears.  Follow the instructions that come with your meter for inserting the test strip, applying blood to the strip, and using your blood glucose meter. Other Areas to Get Blood for Testing Some meters allow you to use other areas of your body (other than your finger) to test your blood. These areas are called alternative sites. The most common alternative sites are:  The forearm.  The thigh.  The back area of the lower leg.  The palm of the hand. The blood flow in these areas is slower. Therefore, the blood glucose values you get may be delayed, and the numbers are different from what you would get from your fingers. Do not use alternative sites if you think you are having hypoglycemia. Your  reading will not be accurate. Always use a finger if you are having hypoglycemia. Also, if you cannot feel your lows (hypoglycemia unawareness), always use your fingers for your blood glucose checks. ADDITIONAL TIPS FOR GLUCOSE MONITORING  Do not reuse lancets.  Always carry your supplies with you.  All blood glucose meters have a 24-hour "hotline" number to call if you have questions or need help.  Adjust (calibrate) your blood glucose meter with a control solution after finishing a few boxes of strips. BLOOD GLUCOSE RECORD KEEPING It is a good idea to keep a daily record or log of your blood glucose readings. Most glucose meters, if not all, keep your glucose records stored in the meter. Some meters come with the ability to download your records to your home computer. Keeping a record of your blood glucose readings is especially helpful if you are wanting to look for patterns. Make notes to go along with the blood glucose readings because you might forget what happened at that exact time. Keeping good records helps you and your health care provider to work together to achieve good diabetes management.    This information is not intended to replace advice given to you by your health care provider. Make sure you discuss any questions you have with your health care provider.   Document Released: 01/13/2003 Document Revised: 01/31/2014 Document Reviewed: 06/04/2012 Elsevier Interactive Patient Education [employer].

## 2015-11-11 NOTE — Discharge Summary (Signed)
Physician Discharge Summary [facility]  Patient ID: Caitlin Morris MRN: [id number] DOB/AGE: 06-25-1950 65 y.o.  Admit date: 11/09/2015 Discharge date: 11/11/2015  Admission Diagnoses:  Primary hyperparathyroidism, severe sinus bradycardia  Discharge Diagnoses:  Principal Problem:   Hyperparathyroidism, primary ([place]) Active Problems:   Arrhythmia   Cardiac arrhythmia   Type 2 diabetes mellitus, uncontrolled, with neuropathy (HCC)   Polyneuropathy due to type 2 diabetes mellitus ([place])   Essential hypertension   Bradycardia   Depression with anxiety   CKD (chronic kidney disease) stage 3, GFR 30-59 ml/min   Diabetic nephropathy ([place])   Uncontrolled diabetes mellitus ([place])   Discharged Condition: good  Hospital Course: patient admitted for medical and cardiology evaluation after surgery cancelled due to severe bradycardia with induction of anesthesia.  Prepared for discharge HD#3.  Consults: cardiology; [facility]  Treatments: cardiac Echo, medication changes, diabetes education  Discharge Exam: Blood pressure (!) 171/85, pulse 81, temperature 98.1 F (36.7 C), resp. rate 16, height _0  (1.575 m), weight 81.4 kg (179 lb 9 oz), SpO2 100 %. See progress notes.   Disposition: Home  Discharge Instructions    Diet - low sodium heart healthy    Complete by:  As directed    Discharge instructions    Complete by:  As directed    [place]:   Carry a list of your medications and allergies with you at all times  Call your pharmacy at least 1 week in advance to refill prescriptions  Do not mix any prescribed pain medicine with alcohol  Do not drive any motor vehicles while taking pain medication  Take medications with food unless otherwise directed  Follow-up appointments (date to return to physician): Please call [phone] to confirm your follow up appointment with your  surgeon.  Call your Surgeon if you have:  Temperature greater than 101.0  Persistent nausea and vomiting  Severe uncontrolled pain  Redness, tenderness, or signs of infection (pain, swelling, redness, odor or    green/yellow discharge around the site)  Difficulty breathing, headache or visual disturbances  Hives  Persistent dizziness or light-headedness  Any other questions or concerns you may have after discharge  In an emergency, call 911 or go to an Emergency Department at a nearby hospital.   Diet: Begin with liquids, and if they are tolerated, resume your usual diet.  Avoid spicy, greasy or heavy foods.  If you have nausea or vomiting, go back to liquids.  If you cannot keep liquids down, call your doctor.  Avoid alcohol consumption while on prescription pain medications. Good nutrition promotes healing. Increase fiber and fluids.   ADDITIONAL INSTRUCTIONS:   Earnstine Regal, MD, [facility] Office: [phone]   Increase activity slowly    Complete by:  As directed    No wound care    Complete by:  As directed        Medication List    STOP taking these medications   atenolol 50 MG tablet Commonly known as:  TENORMIN   cloNIDine 0.3 MG tablet Commonly known as:  CATAPRES   naproxen sodium 220 MG tablet Commonly known as:  ANAPROX   traMADol 50 MG tablet Commonly known as:  ULTRAM     TAKE these medications   accu-chek soft touch lancets Use as instructed   Alcohol Swabs 70 % Pads 1 application by Does not apply route 2 (two) times daily.   allopurinol  100 MG tablet Commonly known as:  ZYLOPRIM Take 100 mg by mouth 2 (two) times daily.   amLODipine 10 MG tablet Commonly known as:  NORVASC Take 10 mg by mouth daily.   clonazePAM 1 MG tablet Commonly known as:  KLONOPIN Take 1 mg by mouth at bedtime.   diclofenac sodium 1 % Gel Commonly known as:  VOLTAREN Apply 2 g topically 4 (four) times daily as needed (JOINT PAIN).    gabapentin 600 MG tablet Commonly known as:  NEURONTIN Take 600 mg by mouth 2 (two) times daily.   glimepiride 4 MG tablet Commonly known as:  AMARYL Take 4 mg by mouth daily with breakfast.   glipiZIDE 5 MG 24 hr tablet Commonly known as:  GLUCOTROL XL Take 5 mg by mouth daily with breakfast.   glucose blood test strip Commonly known as:  ACCU-CHEK ACTIVE STRIPS Use as instructed   glucose monitoring kit monitoring kit 1 each by Does not apply route 4 (four) times daily - after meals and at bedtime. 1 month Diabetic Testing Supplies for QAC-QHS accuchecks.   hydrALAZINE 50 MG tablet Commonly known as:  APRESOLINE Take 1 tablet (50 mg total) by mouth 3 (three) times daily.   HYDROcodone-acetaminophen 5-325 MG tablet Commonly known as:  NORCO/VICODIN TAKE 1 TABLET EVERY 8 HOURS AS MEED FOR PAIN   insulin glargine 100 unit/mL Sopn Commonly known as:  LANTUS Inject 0.3 mLs (30 Units total) into the skin at bedtime.   Insulin Pen Needle 29G X 61TE Misc 1 application by Does not apply route at bedtime.   latanoprost 0.005 % ophthalmic solution Commonly known as:  XALATAN Place 1 drop into both eyes at bedtime.   lisinopril-hydrochlorothiazide 20-25 MG tablet Commonly known as:  PRINZIDE,ZESTORETIC Take 1 tablet by mouth daily.   pravastatin 80 MG tablet Commonly known as:  PRAVACHOL Take 80 mg by mouth at bedtime.   PRESCRIPTION MEDICATION Apply 1 application topically daily as needed (JOINT PAIN). CARE ALL   sertraline 100 MG tablet Commonly known as:  ZOLOFT Take 100 mg by mouth daily.   tetrahydrozoline-zinc 0.05-0.25 % ophthalmic solution Commonly known as:  VISINE-AC Place 2 drops into both eyes 3 (three) times daily as needed (DRY EYES).      Follow-up Information    Cecilie Kicks, NP Follow up on 11/26/2015.   Specialties:  Cardiology, Radiology Why:  Cardiology Hospital Follow-Up on 11/26/2015 at 9:30AM. Contact information: [address] [place] [address] [phone]        Earnstine Regal, MD. Schedule an appointment as soon as possible for a visit in 3 week(s).   Specialty:  General Surgery Contact information: [address] [place] [address] [phone]           Earnstine Regal, MD, [facility] Office: [phone]   Signed: Earnstine Regal 11/11/2015, 1:34 PM Physician Discharge Summary [facility]

## 2015-11-11 NOTE — Progress Notes (Signed)
Patient Name: Caitlin Morris Date of Encounter: 11/11/2015  Primary Cardiologist: Dr. 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 Feeling well and anxious to go home.   Inpatient Medications    Scheduled Meds: . allopurinol  100 mg Oral BID  . amLODipine  10 mg Oral Daily  . capsaicin   Topical BID  . clonazePAM  1 mg Oral QHS  . enoxaparin (LOVENOX) injection  30 mg Subcutaneous Q24H  . gabapentin  600 mg Oral BID  . hydrALAZINE  50 mg Oral TID  . lisinopril  20 mg Oral Daily   And  . hydrochlorothiazide  25 mg Oral Daily  . insulin aspart  0-15 Units Subcutaneous TID WC  . insulin aspart  8 Units Subcutaneous TID WC  . insulin glargine  20 Units Subcutaneous Daily  . sertraline  100 mg Oral Daily   Continuous Infusions:   PRN Meds: hydrALAZINE, HYDROcodone-acetaminophen, ondansetron **OR** ondansetron (ZOFRAN) IV   Vital Signs    Vitals:   11/10/15 0931 11/10/15 1510 11/10/15 2052 11/11/15 0449  BP: (!) 161/79 (!) 167/82 (!) 174/90 (!) 150/67  Pulse: (!) 49 (!) 52 (!) 53 (!) 58  Resp:  18 18 18   Temp: 98 F (36.7 C) 98.3 F (36.8 C) 98.8 F (37.1 C) 98.2 F (36.8 C)  TempSrc:  Oral Oral Oral  SpO2:  99% 98% 98%  Weight:      Height:        Intake/Output Summary (Last 24 hours) at 11/11/15 0912 Last data filed at 11/11/15 0600  Gross per 24 hour  Intake            887.5 ml  Output                0 ml  Net            887.5 ml   Filed Weights   11/09/15  1142  Weight: 179 lb 9 oz (81.4 kg)    Physical Exam   GEN: Well nourished, well developed female appearing in no acute distress.  HEENT: Grossly normal.  Neck: Supple, no JVD, carotid bruits, or masses. Cardiac: RRR, no murmurs, rubs, or gallops. No clubbing, cyanosis, edema.  Radials/DP/PT 2+ and equal bilaterally.  Respiratory:  Respirations regular and unlabored, clear to auscultation bilaterally. GI: Soft, nontender, nondistended, BS + x 4. MS: no deformity or atrophy. Skin: warm and dry, no rash. Neuro:  Strength and sensation are intact. Psych: AAOx3.  Normal affect.  Labs    CBC  Recent Labs  11/09/15 1408 11/10/15 0434  WBC 4.7 4.5  HGB 10.9* 10.7*  HCT 33.6* 34.4*  MCV 83.0 86.9  PLT 207 [id number]   Basic Metabolic Panel  Recent Labs  11/09/15 1408 11/10/15 0434  NA 144 141  K 3.6 3.7  CL 107 107  CO2 29 28  GLUCOSE 181* 236*  BUN 40* 36*  CREATININE 1.59* 1.35*  CALCIUM 10.1 10.0   Liver Function Tests  Recent  Labs  11/09/15 1408  AST 20  ALT 17  ALKPHOS 105  BILITOT 0.7  PROT 7.3  ALBUMIN 3.7   No results for input(s): LIPASE, AMYLASE in the last 72 hours. Cardiac Enzymes  Recent Labs  11/09/15 1408  CKTOTAL 62  CKMB 1.2  TROPONINI 0.03*    Telemetry    NSR, HR in 50's - 60's. Episodes of Ventricular Bigeminy - Personally Reviewed  ECG    No new tracings.   Radiology    No results found.  Cardiac Studies   Echocardiogram: 11/10/2015 Study Conclusions  - Left ventricle: The cavity size was normal. Systolic function was   normal. The estimated ejection fraction was in the range of 55%   to 60%. Wall motion was normal; there were no regional wall   motion abnormalities. Features are consistent with a pseudonormal   left ventricular filling pattern, with concomitant abnormal   relaxation and increased filling pressure (grade 2 diastolic   dysfunction). - Left atrium: The atrium was moderately dilated. - Pulmonic valve:  There was trivial regurgitation.  Patient Profile     65 y.o. female w/ PMH of DMT2, HTN, CKD, primary hyperparathyroidism and HLD who was admitted to [facility] on 11/09/15 for planned left inferior parathyroidectomy which was canceled by Dr. Orene Desanctis secondary to bradycardia with escape PVCs. Cardiology consulted for help in management of her bradyarrhythmia.   Assessment & Plan    1. Sinus Bradycardia - prior ECG's show marked sinus bradycardia dating back to 08/2015. Home Atenolol 50mg  was discontinued and Clonidine 0.3 TID was weaned to 0.1mg  TID. Clonidine has since been switched to Hydralazine 50mg  TID.  - HR has improved to the 50's - 60's. Episodes of ventricular bigeminy noted.   2. Primary hyperparathyroidism - per Dr. Harlow Asa, will plan to reschedule parathyroidectomy in 4-6 weeks (not this admission).  3. HTN - Continued on Amlodipine 10mg  daily and Prinzide 20-25mg . Hydralazine increased from 25mg  TID to 50mg  TID this morning.   4. Poorly controlled DMT2 - HgA1c 11.5.  - per admitting team   5. CKD - creatinine at 1.68 on admission, improved to 1.35 on 10/17.  6. HLD - on Pravastatin 80mg  daily as outpatient.  7. Ventriclar ectopy - BB stopped in the setting of bradycardia. Asymptomatic. - echocardiogram shows preserved EF of 55% to 60% with no regional wall motion abnormalities. Grade 2 DD noted.  Have arranged for 2-week hospital Cardiology follow-up as an outpatient.  Signed, Erma Heritage, PA  11/11/2015, 9:12 AM   Personally seen and examined. Agree with above. HR much improved after stopping atenolol 50 and clonidine. OK for DC. RRR.   Candee Furbish, MD

## 2015-11-11 NOTE — Progress Notes (Signed)
D/C instructions reviewed w/ pt. Pt verbalizes understanding and all questions answered. Pt aware of need to p/u scripts on way home. Awaiting family for ride home. Pt in stable condition in possession of d/c instructions, scripts, and all personal belongings.

## 2015-11-12 ENCOUNTER — Encounter: Payer: Self-pay | Admitting: Cardiology

## 2015-11-25 NOTE — Progress Notes (Signed)
Cardiology Office Note   Date:  11/26/2015   ID:  [name], DOB 1950-03-26, MRN [id number]  PCP:  Benito Mccreedy, MD  Cardiologist:  Dr. Marlou Porch    Chief Complaint  Patient presents with  . Hospitalization Follow-up    pt c/o of no chest pain   . Edema    both feet, left feet is more       History of Present Illness: Caitlin Morris is a 65 y.o. female who presents for post hospitalization.   PMH of DMT2, HTN, CKD, primary hyperparathyroidism and HLD who was admitted to [facility] on 11/09/15 for planned left inferior parathyroidectomy which was canceled by Dr. Orene Desanctis secondary to bradycardia with escape PVCs.  This is planned for rescheduling in next few weeks.   Prior ECG's show marked sinus bradycardia dating back to 08/2015. Home Atenolol 4m was discontinued and Clonidine 0.3 TID was weaned to 0.147mTID. Clonidine has since been switched to Hydralazine 5067mID.  - HR has improved to the 50's - 60's. Episodes of ventricular bigeminy noted.  Pt's meds adjusted to control HTN.    Today does complain of feet swelling, but is mild.  No chest pain and no SOB and her HR is now 54 and BP much improved at 138/80.  She has not yet seen her PCP back for her diabetes.  She does not check glucose at home.     Past Medical History:  Diagnosis Date  . Anxiety   . Arthritis   . Depression   . Diabetes mellitus without complication ([facility] . Hyperlipidemia   . Hypertension   . Obesity   . [facility]   Past Surgical History:  Procedure Laterality Date  . JOINT REPLACEMENT     Left TKA  . TOTAL KNEE ARTHROPLASTY Left 10/01/2013   Procedure: TOTAL KNEE ARTHROPLASTY;  Surgeon: JusNita SellsD;  [facility] [place];  Laterality: Left;  Left total knee arthroplasty  . TUBAL LIGATION       Current Outpatient Prescriptions  Medication Sig Dispense Refill  . allopurinol (ZYLOPRIM) 100 MG tablet Take 100 mg by mouth 2 (two) times daily.     .  Marland KitchenmLODipine (NORVASC) 10 MG tablet Take 10 mg by mouth daily.    . clonazePAM (KLONOPIN) 1 MG tablet Take 1 mg by mouth at bedtime.     . diclofenac sodium (VOLTAREN) 1 % GEL Apply 2 g topically 4 (four) times daily as needed (JOINT PAIN).     . gMarland Kitchenbapentin (NEURONTIN) 600 MG tablet Take 600 mg by mouth 2 (two) times daily.    . gMarland Kitchenimepiride (AMARYL) 4 MG tablet Take 4 mg by mouth daily with breakfast.     . glipiZIDE (GLUCOTROL XL) 5 MG 24 hr tablet Take 5 mg by mouth daily with breakfast.    . glucose blood (ACCU-CHEK ACTIVE STRIPS) test strip Use as instructed 100 each 12  . glucose monitoring kit (FREESTYLE) monitoring kit 1 each by Does not apply route 4 (four) times daily - after meals and at bedtime. 1 month Diabetic Testing Supplies for QAC-QHS accuchecks. 1 each 1  . hydrALAZINE (APRESOLINE) 50 MG tablet Take 1 tablet (50 mg total) by mouth 3 (three) times daily. 90 tablet 0  . HYDROcodone-acetaminophen (NORCO/VICODIN) 5-325 MG tablet TAKE 1 TABLET EVERY 8 HOURS AS MEED FOR PAIN  0  . insulin glargine (LANTUS) 100 unit/mL SOPN Inject 0.3 mLs (30 Units total) into the skin  at bedtime. 15 mL 3  . Insulin Pen Needle 29G X 18HU MISC 1 application by Does not apply route at bedtime. 100 each 0  . Lancets (ACCU-CHEK SOFT TOUCH) lancets Use as instructed 100 each 12  . latanoprost (XALATAN) 0.005 % ophthalmic solution Place 1 drop into both eyes at bedtime.    Marland Kitchen lisinopril-hydrochlorothiazide (PRINZIDE,ZESTORETIC) 20-25 MG per tablet Take 1 tablet by mouth daily.    . pravastatin (PRAVACHOL) 80 MG tablet Take 80 mg by mouth at bedtime.    Marland Kitchen PRESCRIPTION MEDICATION Apply 1 application topically daily as needed (JOINT PAIN). CARE ALL    . sertraline (ZOLOFT) 100 MG tablet Take 100 mg by mouth daily.    Marland Kitchen tetrahydrozoline-zinc (VISINE-AC) 0.05-0.25 % ophthalmic solution Place 2 drops into both eyes 3 (three) times daily as needed (DRY EYES).     No current facility-administered medications for this  visit.     Allergies:   Codeine    Social History:  The patient  reports that she quit smoking about 10 years ago. Her smoking use included Cigarettes. She has a 30.00 pack-year smoking history. She has never used smokeless tobacco. She reports that she does not drink alcohol or use drugs.   Family History:   No early cardiac history. Both her sister and son have diabetes   ROS:  General:no colds or fevers, no weight changes Skin:no rashes or ulcers HEENT:no blurred vision, no congestion CV:see HPI PUL:see HPI GI:no diarrhea constipation or melena, no indigestion GU:no hematuria, no dysuria MS:no joint pain, no claudication Neuro:no syncope, no lightheadedness Endo:+ diabetes, no thyroid disease  Wt Readings from Last 3 Encounters:  11/26/15 173 lb 6.4 oz (78.7 kg)  11/09/15 179 lb 9 oz (81.4 kg)  11/06/15 179 lb 9 oz (81.4 kg)     PHYSICAL EXAM: VS:  BP 138/80   Pulse (!) 57   Ht _0  (1.575 m)   Wt 173 lb 6.4 oz (78.7 kg)   SpO2 98%   BMI 31.72 kg/m  , BMI Body mass index is 31.72 kg/m. General:Pleasant affect, NAD Skin:Warm and dry, brisk capillary refill HEENT:normocephalic, sclera clear, mucus membranes moist Neck:supple, no JVD, no bruits  Heart:S1S2 RRR without murmur, gallup, rub or click Lungs:clear without rales, rhonchi, or wheezes DJS:HFWY, non tender, + BS, do not palpate liver spleen or masses Ext:+ edema in feet only, no other lower ext edema, 2+ pedal pulses, 2+ radial pulses Neuro:alert and oriented X 3, MAE, follows commands, + facial symmetry    EKG:  EKG is ordered today. The ekg ordered today demonstrates Sinus brady at 54 1st degree AV block.  No changes except no PVCs and improved HR.     Recent Labs: 08/29/2015: TSH 0.619 11/09/2015: ALT 17 11/10/2015: BUN 36; Creatinine, Ser 1.35; Hemoglobin 10.7; Platelets 210; Potassium 3.7; Sodium 141    Lipid Panel    Component Value Date/Time   CHOL 205 (H) 05/05/2009 2132   TRIG 98  05/05/2009 2132   HDL 43 05/05/2009 2132   CHOLHDL 4.8 Ratio 05/05/2009 2132   VLDL 20 05/05/2009 2132   LDLCALC 142 (H) 05/05/2009 2132       Other studies Reviewed: Additional studies/ records that were reviewed today include: . ECHO: Study Conclusions  - Left ventricle: The cavity size was normal. Systolic function was   normal. The estimated ejection fraction was in the range of 55%   to 60%. Wall motion was normal; there were no regional wall   motion  abnormalities. Features are consistent with a pseudonormal   left ventricular filling pattern, with concomitant abnormal   relaxation and increased filling pressure (grade 2 diastolic   dysfunction). - Left atrium: The atrium was moderately dilated. - Pulmonic valve: There was trivial regurgitation.   ASSESSMENT AND PLAN:  1.  Sinus bradycardia 54 today, improved and she has no dizziness.   2. PVCs none currently   3.   Primary hyperparathyroidism - per Dr. Harlow Asa, will plan to reschedule parathyroidectomy in 4-6 weeks discussed with Dr. Marlou Porch ok to proceed with surgery.  3. HTN - Continued on Amlodipine 72m daily and Prinzide 20-221m Hydralazine 5027mID today with improvement of BP.  4. Poorly controlled DMT2 - HgA1c 11.5.  -per PCP has not yet seen.  5. CKD - creatinine at 1.68 on admission, improved to 1.35 on 10/17.  6. HLD - on Pravastatin 7m17mily as outpatient.  7. Edema of feet mild today she does not use salt or eat foods with concentrated salt.  Instructed to elevate feet while sitting.  She will call if this increases.  May be due to amlodipine.     Current medicines are reviewed with the patient today.  The patient Has no concerns regarding medicines.  The following changes have been made:  See above Labs/ tests ordered today include:see above  Disposition:   FU:  see above  Signed, LaurCecilie Kicks  11/26/2015 5:07 PM    [place]  [id number]: [phone]: [phone]

## 2015-11-26 ENCOUNTER — Encounter (INDEPENDENT_AMBULATORY_CARE_PROVIDER_SITE_OTHER): Payer: Self-pay

## 2015-11-26 ENCOUNTER — Ambulatory Visit (INDEPENDENT_AMBULATORY_CARE_PROVIDER_SITE_OTHER): Payer: Medicare Other | Admitting: Cardiology

## 2015-11-26 ENCOUNTER — Encounter: Payer: Self-pay | Admitting: Cardiology

## 2015-11-26 VITALS — BP 138/80 | HR 57 | Ht 62.0 in | Wt 173.4 lb

## 2015-11-26 DIAGNOSIS — R001 Bradycardia, unspecified: Secondary | ICD-10-CM

## 2015-11-26 DIAGNOSIS — I493 Ventricular premature depolarization: Secondary | ICD-10-CM | POA: Diagnosis not present

## 2015-11-26 DIAGNOSIS — E1122 Type 2 diabetes mellitus with diabetic chronic kidney disease: Secondary | ICD-10-CM

## 2015-11-26 DIAGNOSIS — I495 Sick sinus syndrome: Secondary | ICD-10-CM | POA: Diagnosis not present

## 2015-11-26 DIAGNOSIS — N181 Chronic kidney disease, stage 1: Secondary | ICD-10-CM

## 2015-11-26 DIAGNOSIS — I10 Essential (primary) hypertension: Secondary | ICD-10-CM | POA: Diagnosis not present

## 2015-11-26 DIAGNOSIS — E210 Primary hyperparathyroidism: Secondary | ICD-10-CM

## 2015-11-26 DIAGNOSIS — Z794 Long term (current) use of insulin: Secondary | ICD-10-CM

## 2015-11-26 NOTE — Patient Instructions (Addendum)
Medication Instructions:  Your physician recommends that you continue on your current medications as directed. Please refer to the Current Medication list given to you today.   Labwork: None ordered  Testing/Procedures: None ordered  Follow-Up: Your physician recommends that you schedule a follow-up appointment in: 3 MONTHS WITH DR. Marlou Porch   Any Other Special Instructions Will Be Listed Below (If Applicable).  1.  For your foot swelling, when you are sitting down, elevate your legs (level with your heart)   If you need a refill on your cardiac medications before your next appointment, please call your pharmacy.

## 2015-12-02 ENCOUNTER — Ambulatory Visit: Payer: Self-pay | Admitting: Surgery

## 2015-12-28 ENCOUNTER — Encounter (HOSPITAL_COMMUNITY)
Admission: RE | Admit: 2015-12-28 | Discharge: 2015-12-28 | Disposition: A | Discharge status: Home / Self Care | Payer: Medicare Other | Source: Ambulatory Visit | Attending: Surgery | Admitting: Surgery

## 2015-12-28 ENCOUNTER — Encounter (HOSPITAL_COMMUNITY): Payer: Self-pay

## 2015-12-28 DIAGNOSIS — F329 Major depressive disorder, single episode, unspecified: Secondary | ICD-10-CM

## 2015-12-28 DIAGNOSIS — E119 Type 2 diabetes mellitus without complications: Secondary | ICD-10-CM | POA: Insufficient documentation

## 2015-12-28 DIAGNOSIS — E669 Obesity, unspecified: Secondary | ICD-10-CM | POA: Insufficient documentation

## 2015-12-28 DIAGNOSIS — E7800 Pure hypercholesterolemia, unspecified: Secondary | ICD-10-CM | POA: Diagnosis not present

## 2015-12-28 DIAGNOSIS — Z794 Long term (current) use of insulin: Secondary | ICD-10-CM | POA: Diagnosis not present

## 2015-12-28 DIAGNOSIS — D351 Benign neoplasm of parathyroid gland: Secondary | ICD-10-CM | POA: Diagnosis not present

## 2015-12-28 DIAGNOSIS — I10 Essential (primary) hypertension: Secondary | ICD-10-CM | POA: Insufficient documentation

## 2015-12-28 DIAGNOSIS — N183 Chronic kidney disease, stage 3 (moderate): Secondary | ICD-10-CM | POA: Diagnosis not present

## 2015-12-28 DIAGNOSIS — I129 Hypertensive chronic kidney disease with stage 1 through stage 4 chronic kidney disease, or unspecified chronic kidney disease: Secondary | ICD-10-CM | POA: Diagnosis not present

## 2015-12-28 DIAGNOSIS — Z01812 Encounter for preprocedural laboratory examination: Secondary | ICD-10-CM | POA: Insufficient documentation

## 2015-12-28 DIAGNOSIS — Z87891 Personal history of nicotine dependence: Secondary | ICD-10-CM | POA: Diagnosis not present

## 2015-12-28 DIAGNOSIS — Z79899 Other long term (current) drug therapy: Secondary | ICD-10-CM | POA: Diagnosis not present

## 2015-12-28 DIAGNOSIS — Z8673 Personal history of transient ischemic attack (TIA), and cerebral infarction without residual deficits: Secondary | ICD-10-CM | POA: Diagnosis not present

## 2015-12-28 DIAGNOSIS — E210 Primary hyperparathyroidism: Secondary | ICD-10-CM | POA: Diagnosis present

## 2015-12-28 DIAGNOSIS — E1022 Type 1 diabetes mellitus with diabetic chronic kidney disease: Secondary | ICD-10-CM | POA: Diagnosis not present

## 2015-12-28 HISTORY — DX: Unspecified cataract: H26.9

## 2015-12-28 LAB — CBC
HCT: 38.4 % (ref 36.0–46.0)
Hemoglobin: 11.9 g/dL — ABNORMAL LOW (ref 12.0–15.0)
MCH: 27.3 pg (ref 26.0–34.0)
MCHC: 31 g/dL (ref 30.0–36.0)
MCV: 88.1 fL (ref 78.0–100.0)
PLATELETS: 306 10*3/uL (ref 150–400)
RBC: 4.36 MIL/uL (ref 3.87–5.11)
RDW: 15.3 % (ref 11.5–15.5)
WBC: 9.6 10*3/uL (ref 4.0–10.5)

## 2015-12-28 LAB — BASIC METABOLIC PANEL
Anion gap: 9 (ref 5–15)
BUN: 35 mg/dL — ABNORMAL HIGH (ref 6–20)
CALCIUM: 10.5 mg/dL — AB (ref 8.9–10.3)
CO2: 26 mmol/L (ref 22–32)
CREATININE: 1.1 mg/dL — AB (ref 0.44–1.00)
Chloride: 103 mmol/L (ref 101–111)
GFR calc non Af Amer: 51 mL/min — ABNORMAL LOW (ref >60)
GFR, EST AFRICAN AMERICAN: 59 mL/min — AB (ref >60)
Glucose, Bld: 119 mg/dL — ABNORMAL HIGH (ref 65–99)
Potassium: 3.2 mmol/L — ABNORMAL LOW (ref 3.5–5.1)
SODIUM: 138 mmol/L (ref 135–145)

## 2015-12-28 LAB — GLUCOSE, CAPILLARY: Glucose-Capillary: 135 mg/dL — ABNORMAL HIGH (ref 65–99)

## 2015-12-28 NOTE — Patient Instructions (Signed)
DANIJA SEDBERRY  12/28/2015   Your procedure is scheduled on: 12-31-15  Report to [facility] Main  Entrance take [facility]  elevators to 3rd floor to  [place] at   [address] AM.  Call this number if you have problems the morning of surgery [phone]   Remember: ONLY 1 PERSON MAY GO WITH YOU TO SHORT STAY TO GET  READY MORNING OF [place].  Do not eat food or drink liquids :After Midnight.     Take these medicines the morning of surgery with A SIP OF WATER: Amlodipine. Allopurinol. Gabapentin. Use Eye drops-usual.  DO NOT TAKE ANY DIABETIC MEDICATIONS DAY OF YOUR SURGERY-see further instructions below.                               You may not have any metal on your body including hair pins and              piercings  Do not wear jewelry, make-up, lotions, powders or perfumes, deodorant             Do not wear nail polish.  Do not shave  48 hours prior to surgery.              Men may shave face and neck.   Do not bring valuables to the hospital. [place].  Contacts, dentures or bridgework may not be worn into surgery.  Leave suitcase in the car. After surgery it may be brought to your room.     Patients discharged the day of surgery will not be allowed to drive home.  Name and phone number of your driver: Carolyn-daughter [phone] cell  Special Instructions: N/A              Please read over the following fact sheets you were given: _____________________________________________________________________             [facility] - Preparing for Surgery Before surgery, you can play an important role.  Because skin is not sterile, your skin needs to be as free of germs as possible.  You can reduce the number of germs on your skin by washing with CHG (chlorahexidine gluconate) soap before surgery.  CHG is an antiseptic cleaner which kills germs and bonds with the skin to continue killing germs even  after washing. Please DO NOT use if you have an allergy to CHG or antibacterial soaps.  If your skin becomes reddened/irritated stop using the CHG and inform your nurse when you arrive at Short Stay. Do not shave (including legs and underarms) for at least 48 hours prior to the first CHG shower.  You may shave your face/neck. Please follow these instructions carefully:  1.  Shower with CHG Soap the night before surgery and the  morning of Surgery.  2.  If you choose to wash your hair, wash your hair first as usual with your  normal  shampoo.  3.  After you shampoo, rinse your hair and body thoroughly to remove the  shampoo.                           4.  Use CHG as you would any other liquid  soap.  You can apply chg directly  to the skin and wash                       Gently with a scrungie or clean washcloth.  5.  Apply the CHG Soap to your body ONLY FROM THE NECK DOWN.   Do not use on face/ open                           Wound or open sores. Avoid contact with eyes, ears mouth and genitals (private parts).                       Wash face,  Genitals (private parts) with your normal soap.             6.  Wash thoroughly, paying special attention to the area where your surgery  will be performed.  7.  Thoroughly rinse your body with warm water from the neck down.  8.  DO NOT shower/wash with your normal soap after using and rinsing off  the CHG Soap.                9.  Pat yourself dry with a clean towel.            10.  Wear clean pajamas.            11.  Place clean sheets on your bed the night of your first shower and do not  sleep with pets. Day of Surgery : Do not apply any lotions/deodorants the morning of surgery.  Please wear clean clothes to the hospital/surgery center.  FAILURE TO FOLLOW THESE INSTRUCTIONS MAY RESULT IN THE CANCELLATION OF YOUR SURGERY PATIENT SIGNATURE_________________________________  NURSE  SIGNATURE__________________________________  ________________________________________________________________________ How to Manage Your Diabetes Before and After Surgery  Why is it important to control my blood sugar before and after surgery? . Improving blood sugar levels before and after surgery helps healing and can limit problems. . A way of improving blood sugar control is eating a healthy diet by: o  Eating less sugar and carbohydrates o  Increasing activity/exercise o  Talking with your doctor about reaching your blood sugar goals . High blood sugars (greater than 180 mg/dL) can raise your risk of infections and slow your recovery, so you will need to focus on controlling your diabetes during the weeks before surgery. . Make sure that the doctor who takes care of your diabetes knows about your planned surgery including the date and location.  How do I manage my blood sugar before surgery? . Check your blood sugar at least 4 times a day, starting 2 days before surgery, to make sure that the level is not too high or low. o Check your blood sugar the morning of your surgery when you wake up and every 2 hours until you get to the Short Stay unit. . If your blood sugar is less than 70 mg/dL, you will need to treat for low blood sugar: o Do not take insulin. o Treat a low blood sugar (less than 70 mg/dL) with  cup of clear juice (cranberry or apple), 4 glucose tablets, OR glucose gel. o Recheck blood sugar in 15 minutes after treatment (to make sure it is greater than 70 mg/dL). If your blood sugar is not greater than 70 mg/dL on recheck, call [phone] for further instructions. . Report your blood sugar to the  short stay nurse when you get to Short Stay.  . If you are admitted to the hospital after surgery: o Your blood sugar will be checked by the staff and you will probably be given insulin after surgery (instead of oral diabetes medicines) to make sure you have good blood sugar  levels. o The goal for blood sugar control after surgery is 80-180 mg/dL.   WHAT DO I DO ABOUT MY DIABETES MEDICATION?  Marland Kitchen Do not take oral diabetes medicines (pills) the morning of surgery.  . THE NIGHT BEFORE SURGERY, take __15 units of ___Lantus  insulin.       . THE MORNING OF SURGERY, take ___No  units of _Lantus insulin.  . The day of surgery, do not take other diabetes injectables, including Byetta (exenatide), Bydureon (exenatide ER), Victoza (liraglutide), or Trulicity (dulaglutide).  . If your CBG is greater than 220 mg/dL, you may take  of your sliding scale  . (correction) dose of insulin.     Patient Signature:  Date:   Nurse Signature:  Date:   Reviewed and Endorsed by [facility] Patient Education Committee, August 2015

## 2015-12-28 NOTE — Progress Notes (Signed)
12-28-15 1450 Note viewable labs in Epic- note Potassium, creatinine, BUN . Pt using diuretic. Note to Dr. Harlow Asa per Epic note.

## 2015-12-29 ENCOUNTER — Encounter (HOSPITAL_COMMUNITY): Payer: Self-pay | Admitting: Surgery

## 2015-12-29 LAB — HEMOGLOBIN A1C
HEMOGLOBIN A1C: 10 % — AB (ref 4.8–5.6)
MEAN PLASMA GLUCOSE: 240 mg/dL

## 2015-12-29 NOTE — Progress Notes (Signed)
12-29-15 0850 Note Hgb A1C =10.0 viewable in Epic-is diabetic with oral and Lantus use.

## 2015-12-29 NOTE — H&P (Addendum)
General Surgery [facility]  Caitlin Morris DOB: [birth year] Single / Language: Caitlin Morris / Race: Black or African American Female  History of Present Illness   The patient is a 65 year old female who presents with a parathyroid neoplasm.  Patient is referred by Dr. Erling Morris for evaluation of suspected primary hyperparathyroidism. Patient's primary care physician is Dr. Iona Beard Morris. Patient was noted on routine laboratory studies to have an elevated calcium level of 10.3, an elevated intact PTH level of 81, and a very low vitamin D level of 9.7. Patient has been treated with vitamin D supplements in the past but is not currently taking vitamin D. Patient notes bone and joint pain particularly with arthritis. Patient denies fatigue. She denies frequent urination. By report she has had a bone density scan showing osteopenia. Patient does have stage III chronic kidney disease. She is followed by nephrology. Patient underwent nuclear medicine parathyroid scan on August 05, 2015 at the request of Dr. Erling Morris. 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 Patient is now referred for surgical consultation and further recommendations. Patient has had no prior head or neck surgery. There is no family history of parathyroid disease or other endocrine neoplasm.   Other Problems Diabetes Mellitus High blood pressure Hypercholesterolemia  Past Surgical History Knee Surgery Left.  Diagnostic Studies History Colonoscopy never Pap Smear 1-5 years ago  Allergies Codeine Sulfate *ANALGESICS - OPIOID* Hives, Swelling.  Medication History ClonazePAM (1MG  Tablet, Oral) Active. AmLODIPine Besylate (10MG  Tablet, Oral) Active. Atenolol (50MG  Tablet, Oral) Active. CloNIDine HCl (0.3MG  Tablet, Oral) Active. Gabapentin (600MG  Tablet, Oral) Active. Glimepiride (4MG  Tablet, Oral)  Active. GlipiZIDE XL (5MG  Tablet ER 24HR, Oral) Active. Latanoprost (0.005% Solution, Ophthalmic) Active. Lisinopril-Hydrochlorothiazide (20-25MG  Tablet, Oral) Active. Pravastatin Sodium (80MG  Tablet, Oral) Active. Sertraline HCl (100MG  Tablet, Oral) Active. Hydrocodone-Acetaminophen (5-325MG  Tablet, Oral as needed) Active. Medications Reconciled  Social History Alcohol use Occasional alcohol use. Caffeine use Coffee.  Family History Alcohol Abuse Brother. Diabetes Mellitus Sister, Son. Hypertension Sister, Son. Kidney Disease Sister.  Pregnancy / Birth History Age at menarche 76 years. Gravida 4 Irregular periods Maternal age 66-20 Para 4  Review of Systems HEENT Present- Wears glasses/contact lenses. Not Present- Earache, Hearing Loss, Hoarseness, Nose Bleed, Oral Ulcers, Ringing in the Ears, Seasonal Allergies, Sinus Pain, Sore Throat, Visual Disturbances and Yellow Eyes. Respiratory Not Present- Bloody sputum, Chronic Cough, Difficulty Breathing, Snoring and Wheezing. Cardiovascular Present- Leg Cramps and Swelling of Extremities. Not Present- Chest Pain, Difficulty Breathing Lying Down, Palpitations, Rapid Heart Rate and Shortness of Breath. Gastrointestinal Not Present- Abdominal Pain, Bloating, Bloody Stool, Change in Bowel Habits, Chronic diarrhea, Constipation, Difficulty Swallowing, Excessive gas, Gets full quickly at meals, Hemorrhoids, Indigestion, Nausea, Rectal Pain and Vomiting. Female Genitourinary Not Present- Frequency, Nocturia, Painful Urination, Pelvic Pain and Urgency. Musculoskeletal Present- Joint Pain. Not Present- Back Pain, Joint Stiffness, Muscle Pain, Muscle Weakness and Swelling of Extremities. Neurological Present- Tingling. Not Present- Decreased Memory, Fainting, Headaches, Numbness, Seizures, Tremor, Trouble walking and Weakness. Psychiatric Present- Anxiety and Depression. Not Present- Bipolar, Change in Sleep Pattern, Fearful and  Frequent crying. Hematology Not Present- Blood Thinners, Easy Bruising, Excessive bleeding, Gland problems, HIV and Persistent Infections.  Vitals  Weight: 180 lb Height: 62in Body Surface Area: 1.83 m Body Mass Index: 32.92 kg/m  Temp.: 97.22F  Pulse: 52 (Regular)  BP: 132/78 (Sitting, Left Arm, Standard)  Physical Exam  The physical exam findings are as  follows: Note:General - appears comfortable, no distress; not diaphorectic  HEENT - normocephalic; sclerae clear, gaze conjugate; mucous membranes moist, dentition good; voice normal  Neck - symmetric on extension; no palpable anterior or posterior cervical adenopathy; no palpable masses in the thyroid bed  Chest - clear bilaterally without rhonchi, rales, or wheeze  Cor - regular rhythm with normal rate; no significant murmur  Ext - non-tender without significant edema or lymphedema  Neuro - grossly intact; no tremor   Assessment & Plan  ELEVATED PARATHYROID HORMONE (E34.9) VITAMIN D DEFICIENCY (E55.9)  Pt Education - Pamphlet Given - The Parathyroid Surgery Book: discussed with patient and provided information. Started Ergocalciferol 50000UNIT, 1 (one) Capsule weekly, #15, 08/26/2015, No Refill. Follow Up - Call [facility] office after tests / studies done to discuss further plans  Patient presents with an elevated intact PTH level, a serum calcium level at the upper range of normal, and a very low vitamin D level. Nuclear medicine scanning suggested a possible left parathyroid adenoma. Patient does have chronic kidney disease and osteopenia.  I have requested a thyroid ultrasound examination in hopes of identifying a left parathyroid adenoma. If this study is positive for adenoma, then I believe the patient will be a good candidate for minimally invasive parathyroidectomy. If there is no evidence of adenoma on the ultrasound exam, we will consider treatment of her vitamin D deficiency for 3-4 months and then repeat  her laboratory studies.  Patient understands this plan and agrees to proceed with ultrasound examination in the near future.  Patient and I did discuss the potential need for minimally invasive parathyroidectomy. We discussed doing this as an outpatient surgical procedure. I provided her with written literature on parathyroid surgery to review at home. We will discuss this further pending the results of the above studies.    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  Plan minimally invasive parathyroidectomy.  The risks and benefits of the procedure have been discussed at length with the patient.  The patient understands the proposed procedure, potential alternative treatments, and the course of recovery to be expected.  All of the patient's questions have been answered at this time.  The patient wishes to proceed with surgery.  Earnstine Regal, MD, [facility] Office: [phone]

## 2015-12-30 LAB — HEMOGLOBIN A1C
Hgb A1c MFr Bld: 10 % — ABNORMAL HIGH (ref 4.8–5.6)
MEAN PLASMA GLUCOSE: 240 mg/dL

## 2015-12-30 NOTE — Anesthesia Preprocedure Evaluation (Addendum)
Anesthesia Evaluation  Patient identified by MRN, date of birth, ID band Patient awake    Reviewed: Allergy & Precautions, NPO status , Patient's Chart, lab work & pertinent test results  History of Anesthesia Complications Negative for: history of anesthetic complications  Airway Mallampati: II  TM Distance: >3 FB Neck ROM: Full    Dental  (+) Missing, Dental Advisory Given, Poor Dentition   Pulmonary neg pulmonary ROS, former smoker,    breath sounds clear to auscultation       Cardiovascular hypertension,  Rhythm:Regular Rate:Normal  Bradycardia  Hx during surgery c case canceled: Recent ECHO reviewed   Neuro/Psych CVA    GI/Hepatic negative GI ROS, Neg liver ROS,   Endo/Other  diabetes, Poorly Controlled, Type 1, Oral Hypoglycemic Agents, Insulin Dependent  Renal/GU Renal InsufficiencyRenal disease     Musculoskeletal   Abdominal   Peds  (+) premature delivery Hematology   Anesthesia Other Findings   Reproductive/Obstetrics                           Anesthesia Physical Anesthesia Plan  ASA: III  Anesthesia Plan: General   Post-op Pain Management:    Induction: Intravenous  Airway Management Planned: Oral ETT  Additional Equipment:   Intra-op Plan:   Post-operative Plan: Extubation in OR  Informed Consent: I have reviewed the patients History and Physical, chart, labs and discussed the procedure including the risks, benefits and alternatives for the proposed anesthesia with the patient or authorized representative who has indicated his/her understanding and acceptance.     Plan Discussed with: CRNA  Anesthesia Plan Comments:        Anesthesia Quick Evaluation

## 2015-12-31 ENCOUNTER — Encounter (HOSPITAL_COMMUNITY)
Admission: RE | Disposition: A | Discharge status: Home / Self Care | Payer: Self-pay | Source: Ambulatory Visit | Attending: Surgery

## 2015-12-31 ENCOUNTER — Ambulatory Visit (HOSPITAL_COMMUNITY): Payer: Medicare Other | Admitting: Anesthesiology

## 2015-12-31 ENCOUNTER — Encounter (HOSPITAL_COMMUNITY): Payer: Self-pay | Admitting: *Deleted

## 2015-12-31 ENCOUNTER — Ambulatory Visit (HOSPITAL_COMMUNITY)
Admission: RE | Admit: 2015-12-31 | Discharge: 2015-12-31 | Disposition: A | Discharge status: Home / Self Care | Payer: Medicare Other | Source: Ambulatory Visit | Attending: Surgery | Admitting: Surgery

## 2015-12-31 DIAGNOSIS — D351 Benign neoplasm of parathyroid gland: Secondary | ICD-10-CM | POA: Diagnosis not present

## 2015-12-31 DIAGNOSIS — E210 Primary hyperparathyroidism: Secondary | ICD-10-CM | POA: Insufficient documentation

## 2015-12-31 DIAGNOSIS — E1022 Type 1 diabetes mellitus with diabetic chronic kidney disease: Secondary | ICD-10-CM | POA: Diagnosis not present

## 2015-12-31 DIAGNOSIS — I129 Hypertensive chronic kidney disease with stage 1 through stage 4 chronic kidney disease, or unspecified chronic kidney disease: Secondary | ICD-10-CM | POA: Insufficient documentation

## 2015-12-31 DIAGNOSIS — Z79899 Other long term (current) drug therapy: Secondary | ICD-10-CM | POA: Insufficient documentation

## 2015-12-31 DIAGNOSIS — Z8673 Personal history of transient ischemic attack (TIA), and cerebral infarction without residual deficits: Secondary | ICD-10-CM | POA: Insufficient documentation

## 2015-12-31 DIAGNOSIS — E7800 Pure hypercholesterolemia, unspecified: Secondary | ICD-10-CM | POA: Diagnosis not present

## 2015-12-31 DIAGNOSIS — N183 Chronic kidney disease, stage 3 (moderate): Secondary | ICD-10-CM | POA: Insufficient documentation

## 2015-12-31 DIAGNOSIS — Z87891 Personal history of nicotine dependence: Secondary | ICD-10-CM | POA: Insufficient documentation

## 2015-12-31 DIAGNOSIS — Z794 Long term (current) use of insulin: Secondary | ICD-10-CM | POA: Insufficient documentation

## 2015-12-31 HISTORY — PX: PARATHYROIDECTOMY: SHX19

## 2015-12-31 LAB — GLUCOSE, CAPILLARY
Glucose-Capillary: 138 mg/dL — ABNORMAL HIGH (ref 65–99)
Glucose-Capillary: 191 mg/dL — ABNORMAL HIGH (ref 65–99)

## 2015-12-31 SURGERY — PARATHYROIDECTOMY
Anesthesia: General | Laterality: Left

## 2015-12-31 MED ORDER — ROCURONIUM BROMIDE 50 MG/5ML IV SOSY
PREFILLED_SYRINGE | INTRAVENOUS | Status: AC
Start: 2015-12-31 — End: 2015-12-31
  Filled 2015-12-31: qty 10

## 2015-12-31 MED ORDER — HYDROMORPHONE HCL 1 MG/ML IJ SOLN
INTRAMUSCULAR | Status: AC
  Administered 2015-12-31: 0.5 mg via INTRAVENOUS
  Filled 2015-12-31: qty 0.5

## 2015-12-31 MED ORDER — HYDROCODONE-ACETAMINOPHEN 5-325 MG PO TABS: 1.0000 | ORAL_TABLET | ORAL | Status: DC | PRN

## 2015-12-31 MED ORDER — 0.9 % SODIUM CHLORIDE (POUR BTL) OPTIME
TOPICAL | Status: DC | PRN
  Administered 2015-12-31: 1000 mL

## 2015-12-31 MED ORDER — HYDROMORPHONE HCL 1 MG/ML IJ SOLN
INTRAMUSCULAR | Status: AC
  Administered 2015-12-31: 0.25 mg via INTRAVENOUS
  Filled 2015-12-31: qty 0.5

## 2015-12-31 MED ORDER — BUPIVACAINE HCL 0.25 % IJ SOLN
INTRAMUSCULAR | Status: DC | PRN
  Administered 2015-12-31: 10 mL

## 2015-12-31 MED ORDER — MIDAZOLAM HCL 2 MG/2ML IJ SOLN
INTRAMUSCULAR | Status: AC
  Filled 2015-12-31: qty 2

## 2015-12-31 MED ORDER — PROMETHAZINE HCL 25 MG/ML IJ SOLN: 6.2500 mg | INTRAMUSCULAR | Status: DC | PRN

## 2015-12-31 MED ORDER — HYDROCODONE-ACETAMINOPHEN 5-325 MG PO TABS: 1.0000 | ORAL_TABLET | ORAL | 0 refills | Status: DC | PRN

## 2015-12-31 MED ORDER — ONDANSETRON HCL 4 MG/2ML IJ SOLN
INTRAMUSCULAR | Status: DC | PRN
  Administered 2015-12-31: 4 mg via INTRAVENOUS

## 2015-12-31 MED ORDER — FENTANYL CITRATE (PF) 250 MCG/5ML IJ SOLN
INTRAMUSCULAR | Status: AC
Start: 2015-12-31 — End: 2015-12-31
  Filled 2015-12-31: qty 5

## 2015-12-31 MED ORDER — CEFAZOLIN SODIUM-DEXTROSE 2-4 GM/100ML-% IV SOLN
2.0000 g | INTRAVENOUS | Status: AC
  Administered 2015-12-31: 2 g via INTRAVENOUS
  Filled 2015-12-31: qty 100

## 2015-12-31 MED ORDER — SUGAMMADEX SODIUM 200 MG/2ML IV SOLN
INTRAVENOUS | Status: DC | PRN
  Administered 2015-12-31: 200 mg via INTRAVENOUS

## 2015-12-31 MED ORDER — CEFAZOLIN SODIUM-DEXTROSE 2-4 GM/100ML-% IV SOLN
INTRAVENOUS | Status: AC
  Filled 2015-12-31: qty 100

## 2015-12-31 MED ORDER — EPHEDRINE 5 MG/ML INJ
INTRAVENOUS | Status: AC
  Filled 2015-12-31: qty 20

## 2015-12-31 MED ORDER — LIDOCAINE 2% (20 MG/ML) 5 ML SYRINGE
INTRAMUSCULAR | Status: DC | PRN
  Administered 2015-12-31: 180 mg via INTRAVENOUS

## 2015-12-31 MED ORDER — HYDROMORPHONE HCL 1 MG/ML IJ SOLN
0.2500 mg | INTRAMUSCULAR | Status: DC | PRN
  Administered 2015-12-31: 0.25 mg via INTRAVENOUS
  Administered 2015-12-31 (×2): 0.5 mg via INTRAVENOUS
  Administered 2015-12-31: 0.25 mg via INTRAVENOUS

## 2015-12-31 MED ORDER — LACTATED RINGERS IV SOLN
INTRAVENOUS | Status: DC
  Administered 2015-12-31: 08:00:00 via INTRAVENOUS
  Administered 2015-12-31: 1000 mL via INTRAVENOUS
  Administered 2015-12-31: 10:00:00 via INTRAVENOUS

## 2015-12-31 MED ORDER — ONDANSETRON HCL 4 MG/2ML IJ SOLN
INTRAMUSCULAR | Status: AC
  Filled 2015-12-31: qty 2

## 2015-12-31 MED ORDER — EPHEDRINE SULFATE-NACL 50-0.9 MG/10ML-% IV SOSY
PREFILLED_SYRINGE | INTRAVENOUS | Status: DC | PRN
  Administered 2015-12-31 (×4): 5 mg via INTRAVENOUS

## 2015-12-31 MED ORDER — HYDROMORPHONE HCL 1 MG/ML IJ SOLN
INTRAMUSCULAR | Status: AC
  Filled 2015-12-31: qty 0.5

## 2015-12-31 MED ORDER — PROPOFOL 10 MG/ML IV BOLUS
INTRAVENOUS | Status: DC | PRN
  Administered 2015-12-31: 150 mg via INTRAVENOUS

## 2015-12-31 MED ORDER — BUPIVACAINE HCL (PF) 0.25 % IJ SOLN
INTRAMUSCULAR | Status: AC
  Filled 2015-12-31: qty 30

## 2015-12-31 MED ORDER — LIDOCAINE 2% (20 MG/ML) 5 ML SYRINGE
INTRAMUSCULAR | Status: AC
  Filled 2015-12-31: qty 15

## 2015-12-31 MED ORDER — SUGAMMADEX SODIUM 200 MG/2ML IV SOLN
INTRAVENOUS | Status: AC
  Filled 2015-12-31: qty 2

## 2015-12-31 MED ORDER — MIDAZOLAM HCL 2 MG/2ML IJ SOLN
INTRAMUSCULAR | Status: DC | PRN
  Administered 2015-12-31 (×2): 1 mg via INTRAVENOUS

## 2015-12-31 MED ORDER — ROCURONIUM BROMIDE 10 MG/ML (PF) SYRINGE
PREFILLED_SYRINGE | INTRAVENOUS | Status: DC | PRN
  Administered 2015-12-31: 150 mg via INTRAVENOUS

## 2015-12-31 MED ORDER — PROPOFOL 10 MG/ML IV BOLUS
INTRAVENOUS | Status: AC
  Filled 2015-12-31: qty 20

## 2015-12-31 MED ORDER — FENTANYL CITRATE (PF) 250 MCG/5ML IJ SOLN
INTRAMUSCULAR | Status: DC | PRN
  Administered 2015-12-31 (×2): 25 ug via INTRAVENOUS
  Administered 2015-12-31: 50 ug via INTRAVENOUS
  Administered 2015-12-31: 25 ug via INTRAVENOUS

## 2015-12-31 SURGICAL SUPPLY — 42 items
ADH SKN CLS APL DERMABOND .7 (GAUZE/BANDAGES/DRESSINGS)
APL SKNCLS STERI-STRIP NONHPOA (GAUZE/BANDAGES/DRESSINGS)
ATTRACTOMAT 16X20 MAGNETIC DRP (DRAPES) ×3 IMPLANT
BENZOIN TINCTURE PRP APPL 2/3 (GAUZE/BANDAGES/DRESSINGS) IMPLANT
BLADE HEX COATED 2.75 (ELECTRODE) ×3 IMPLANT
BLADE SURG 15 STRL LF DISP TIS (BLADE) ×1 IMPLANT
BLADE SURG 15 STRL SS (BLADE) ×3
BNDG GAUZE ELAST 4 BULKY (GAUZE/BANDAGES/DRESSINGS) ×2 IMPLANT
CHLORAPREP W/TINT 26ML (MISCELLANEOUS) ×4 IMPLANT
CLIP TI MEDIUM 6 (CLIP) ×4 IMPLANT
CLIP TI WIDE RED SMALL 6 (CLIP) ×6 IMPLANT
CLOSURE WOUND 1/2 X4 (GAUZE/BANDAGES/DRESSINGS) ×1
COVER SURGICAL LIGHT HANDLE (MISCELLANEOUS) ×3 IMPLANT
DERMABOND ADVANCED (GAUZE/BANDAGES/DRESSINGS)
DERMABOND ADVANCED .7 DNX12 (GAUZE/BANDAGES/DRESSINGS) IMPLANT
DRAPE LAPAROTOMY T 98X78 PEDS (DRAPES) ×3 IMPLANT
ELECT PENCIL ROCKER SW 15FT (MISCELLANEOUS) ×3 IMPLANT
ELECT REM PT RETURN 9FT ADLT (ELECTROSURGICAL) ×3
ELECTRODE REM PT RTRN 9FT ADLT (ELECTROSURGICAL) ×1 IMPLANT
GAUZE SPONGE 4X4 16PLY XRAY LF (GAUZE/BANDAGES/DRESSINGS) ×3 IMPLANT
GLOVE SURG ORTHO 8.0 STRL STRW (GLOVE) ×3 IMPLANT
GOWN STRL REUS W/TWL XL LVL3 (GOWN DISPOSABLE) ×9 IMPLANT
HEMOSTAT SURGICEL 2X4 FIBR (HEMOSTASIS) ×3 IMPLANT
ILLUMINATOR WAVEGUIDE N/F (MISCELLANEOUS) ×2 IMPLANT
KIT BASIN OR (CUSTOM PROCEDURE TRAY) ×3 IMPLANT
LIGHT WAVEGUIDE WIDE FLAT (MISCELLANEOUS) IMPLANT
NDL HYPO 25X1 1.5 SAFETY (NEEDLE) ×1 IMPLANT
NEEDLE HYPO 25X1 1.5 SAFETY (NEEDLE) ×3 IMPLANT
PACK BASIC VI WITH GOWN DISP (CUSTOM PROCEDURE TRAY) ×3 IMPLANT
STAPLER VISISTAT 35W (STAPLE) ×1 IMPLANT
STRIP CLOSURE SKIN 1/2X4 (GAUZE/BANDAGES/DRESSINGS) ×1 IMPLANT
SUT MNCRL AB 4-0 PS2 18 (SUTURE) ×3 IMPLANT
SUT SILK 2 0 (SUTURE)
SUT SILK 2-0 18XBRD TIE 12 (SUTURE) IMPLANT
SUT SILK 3 0 (SUTURE)
SUT SILK 3-0 18XBRD TIE 12 (SUTURE) IMPLANT
SUT VIC AB 3-0 SH 18 (SUTURE) ×3 IMPLANT
SYR BULB IRRIGATION 50ML (SYRINGE) ×3 IMPLANT
SYR CONTROL 10ML LL (SYRINGE) ×3 IMPLANT
TOWEL OR 17X26 10 PK STRL BLUE (TOWEL DISPOSABLE) ×3 IMPLANT
TOWEL OR NON WOVEN STRL DISP B (DISPOSABLE) ×3 IMPLANT
YANKAUER SUCT BULB TIP 10FT TU (MISCELLANEOUS) ×3 IMPLANT

## 2015-12-31 NOTE — Discharge Instructions (Signed)

## 2015-12-31 NOTE — Op Note (Signed)
OPERATIVE REPORT - PARATHYROIDECTOMY  Preoperative diagnosis: Primary hyperparathyroidism  Postop diagnosis: Same  Procedure: Left minimally invasive parathyroidectomy  Surgeon:  Earnstine Regal, MD, FACS  Anesthesia: Gen. endotracheal  Estimated blood loss: Minimal  Preparation: ChloraPrep  Indications: Patient is referred by Dr. Erling Cruz for evaluation of suspected primary hyperparathyroidism. Patient's primary care physician is Dr. Iona Beard Osei-Bonsu. Patient was noted on routine laboratory studies to have an elevated calcium level of 10.3, an elevated intact PTH level of 81, and a very low vitamin D level of 9.7. Patient has been treated with vitamin D supplements in the past but is not currently taking vitamin D. Patient notes bone and joint pain particularly with arthritis. Patient denies fatigue. She denies frequent urination. By report she has had a bone density scan showing osteopenia. Patient does have stage III chronic kidney disease. She is followed by nephrology. Patient underwent nuclear medicine parathyroid scan on August 05, 2015 at the request of Dr. Erling Cruz. 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  Patient now comes to surgery for minimally invasive parathyroidectomy.  Procedure: Patient was prepared in the holding area. He was brought to operating room and placed in a supine position on the operating room table. Following administration of general anesthesia, the patient was positioned and then prepped and draped in the usual strict aseptic fashion. After ascertaining that an adequate level of anesthesia been achieved, a neck incision was made with a #15 blade. Dissection was carried through subcutaneous tissues and platysma. Hemostasis was obtained with the electrocautery. Skin flaps were developed  circumferentially and a Weitlander retractor was placed for exposure.  Strap muscles were incised in the midline. Strap muscles were reflected exposing the thyroid lobe. With gentle blunt dissection the thyroid lobe was mobilized.  Dissection was carried through adipose tissue and an enlarged parathyroid gland was identified. It was gently mobilized. Vascular structures were divided between small and medium ligaclips. Care was taken to avoid the recurrent laryngeal nerve and the esophagus. The parathyroid gland was completely excised. It was submitted to pathology where frozen section confirmed parathyroid tissue consistent with adenoma.  On the inferior and posterior aspect of the thyroid gland was a palpable 1 cm thyroid nodule as noted in the USN exam.  Neck was irrigated with warm saline and good hemostasis was noted. Fibrillar was placed in the operative field. Strap muscles were reapproximated in the midline with interrupted 3-0 Vicryl sutures. Platysma was closed with interrupted 3-0 Vicryl sutures. Skin was closed with a running 4-0 Monocryl subcuticular suture. Marcaine was infiltrated circumferentially. Wound was washed and dried and Steri-Strips were applied. Sterile gauze dressings were applied. Patient was awakened from anesthesia and brought to the recovery room. The patient tolerated the procedure well.   Earnstine Regal, MD, [facility]

## 2015-12-31 NOTE — Interval H&P Note (Signed)
History and Physical Interval Note:  12/31/2015 8:24 AM  Caitlin Morris  has presented today for surgery, with the diagnosis of primary hyperparathyroidism.  The various methods of treatment have been discussed with the patient and family. After consideration of risks, benefits and other options for treatment, the patient has consented to    Procedure(s): LEFT INFERIOR PARATHYROIDECTOMY (Left) as a surgical intervention .    The patient's history has been reviewed, patient examined, no change in status, stable for surgery.  I have reviewed the patient's chart and labs.  Questions were answered to the patient's satisfaction.    Earnstine Regal, MD, [facility] Office: [place]

## 2015-12-31 NOTE — Transfer of Care (Signed)
Immediate Anesthesia Transfer of Care Note  Patient: Caitlin Morris  Procedure(s) Performed: Procedure(s): LEFT MINIMALLY INVASIVE PARATHYROIDECTOMY (Left)  Patient Location: PACU  Anesthesia Type:General  Level of Consciousness: sedated  Airway & Oxygen Therapy: Patient Spontanous Breathing and Patient connected to face mask oxygen  Post-op Assessment: Report given to RN and Post -op Vital signs reviewed and stable  Post vital signs: Reviewed and stable  Last Vitals:  Vitals:   12/31/15 0645 12/31/15 0945  BP: (!) 144/84   Pulse: 83   Resp: 16   Temp: 36.6 C 36.4 C    Last Pain:  Vitals:   12/31/15 0758  TempSrc:   PainSc: 0-No pain      Patients Stated Pain Goal: 3 ([id number])  Complications: No apparent anesthesia complications

## 2015-12-31 NOTE — Anesthesia Procedure Notes (Signed)
Procedure Name: Intubation Date/Time: 12/31/2015 8:40 AM Performed by: Cynda Familia Pre-anesthesia Checklist: Patient identified, Emergency Drugs available, Suction available and Patient being monitored Patient Re-evaluated:Patient Re-evaluated prior to inductionOxygen Delivery Method: Circle System Utilized Preoxygenation: Pre-oxygenation with 100% oxygen Intubation Type: IV induction Ventilation: Mask ventilation without difficulty Laryngoscope Size: Miller and 2 Grade View: Grade I Tube type: Oral Number of attempts: 1 Airway Equipment and Method: Stylet Placement Confirmation: ETT inserted through vocal cords under direct vision,  positive ETCO2 and breath sounds checked- equal and bilateral Secured at: 22 cm Tube secured with: Tape Dental Injury: Teeth and Oropharynx as per pre-operative assessment  Comments: Smooth IV induction Massagee--- intubation AM CRNA atraumatic--- teeth and mouth as preop-- bilat BS Massagee--  Many missing chipped teeth prior to laryngoscopy

## 2015-12-31 NOTE — Anesthesia Postprocedure Evaluation (Signed)
Anesthesia Post Note  Patient: Caitlin Morris  Procedure(s) Performed: Procedure(s) (LRB): LEFT MINIMALLY INVASIVE PARATHYROIDECTOMY (Left)  Patient location during evaluation: PACU Anesthesia Type: General Level of consciousness: awake, sedated and oriented Pain management: pain level controlled Vital Signs Assessment: post-procedure vital signs reviewed and stable Respiratory status: spontaneous breathing, nonlabored ventilation, respiratory function stable and patient connected to nasal cannula oxygen Cardiovascular status: blood pressure returned to baseline and stable Postop Assessment: no signs of nausea or vomiting Anesthetic complications: no    Last Vitals:  Vitals:   12/31/15 1100 12/31/15 1115  BP: 122/60 117/76  Pulse: 83 79  Resp: 17 14  Temp:      Last Pain:  Vitals:   12/31/15 1115  TempSrc:   PainSc: Asleep                 Vedant Shehadeh,JAMES TERRILL

## 2016-03-02 ENCOUNTER — Ambulatory Visit: Payer: Medicare Other | Admitting: Cardiology

## 2016-03-15 ENCOUNTER — Encounter (INDEPENDENT_AMBULATORY_CARE_PROVIDER_SITE_OTHER): Payer: Self-pay

## 2016-03-15 ENCOUNTER — Encounter: Payer: Self-pay | Admitting: Cardiology

## 2016-03-15 ENCOUNTER — Ambulatory Visit (INDEPENDENT_AMBULATORY_CARE_PROVIDER_SITE_OTHER): Payer: Medicare Other | Admitting: Cardiology

## 2016-03-15 VITALS — BP 130/82 | HR 68 | Ht 62.0 in | Wt 186.8 lb

## 2016-03-15 DIAGNOSIS — R001 Bradycardia, unspecified: Secondary | ICD-10-CM | POA: Diagnosis not present

## 2016-03-15 DIAGNOSIS — I495 Sick sinus syndrome: Secondary | ICD-10-CM | POA: Diagnosis not present

## 2016-03-15 DIAGNOSIS — Z794 Long term (current) use of insulin: Secondary | ICD-10-CM | POA: Diagnosis not present

## 2016-03-15 DIAGNOSIS — E1122 Type 2 diabetes mellitus with diabetic chronic kidney disease: Secondary | ICD-10-CM | POA: Diagnosis not present

## 2016-03-15 DIAGNOSIS — E210 Primary hyperparathyroidism: Secondary | ICD-10-CM | POA: Diagnosis not present

## 2016-03-15 DIAGNOSIS — I493 Ventricular premature depolarization: Secondary | ICD-10-CM | POA: Diagnosis not present

## 2016-03-15 DIAGNOSIS — N181 Chronic kidney disease, stage 1: Secondary | ICD-10-CM

## 2016-03-15 NOTE — Progress Notes (Signed)
Cardiology Office Note    Date:  03/15/2016   ID:  [name], [name] 17-Jul-1950, MRN [id number]  PCP:  Benito Mccreedy, MD  Cardiologist:   Candee Furbish, MD     History of Present Illness:  Caitlin Morris is a 66 y.o. female here for follow up bradycardia.  I saw her in consultation in PACU prior to parathyroidectomy given PVC's and marked bradycardia (41bpm).  To quote my prior note: 66 year old female with planned left inferior parathyroidectomy which was canceled by Dr. Orene Desanctis secondary to bradycardia with escape PVCs. During induction, there was significant bradycardia, loss of P waves were noted. Admission to telemetry service.  She had been followed by Dr. Adriana Mccallum of nephrology. Also has diabetes, hypertension, hyperlipidemia.  At home, she takes atenolol 50 mg and clonidine 0.3 mg. In addition she is on lisinopril hydrochlorothiazide and amlodipine for blood pressure.  She denies any syncopal episodes at home, no shortness of breath, no chest pain here or at home.  Prior EKG from 08/29/2015 showed marked sinus bradycardia as we are seeing currently with heart rate of 41 bpm."  At that time I stopped her atenolol. K>4.   Overall she's feeling well   Past Medical History:  Diagnosis Date  . Anxiety   . Arthritis    arthritis left foot and gout  . Cataract    not surgical yet  . Depression   . Diabetes mellitus without complication ([place])   . Hyperlipidemia   . Hypertension   . Obesity   . Stroke [facility])    left sided weakness remains-ambulates with cane.    Past Surgical History:  Procedure Laterality Date  . JOINT REPLACEMENT     Left TKA  . PARATHYROIDECTOMY Left 12/31/2015   Procedure: LEFT MINIMALLY INVASIVE PARATHYROIDECTOMY;  Surgeon: Armandina Gemma, MD;  Location: [facility];  Service: General;  Laterality: Left;  . TOTAL KNEE ARTHROPLASTY Left 10/01/2013   Procedure: TOTAL KNEE ARTHROPLASTY;  Surgeon: Nita Sells, MD;  Location: [place];  Service: Orthopedics;  Laterality: Left;  Left total knee arthroplasty  . TUBAL LIGATION      Current Medications: Outpatient Medications Prior to Visit  Medication Sig Dispense Refill  . allopurinol (ZYLOPRIM) 100 MG tablet Take 100 mg by mouth 2 (two) times daily.     Marland Kitchen amLODipine (NORVASC) 10 MG tablet Take 10 mg by mouth daily.    . Capsaicin 0.025 % GEL Apply 1 application topically daily as needed (joint pain).    . clonazePAM (KLONOPIN) 1 MG tablet Take 1 mg by mouth at bedtime.     . diclofenac sodium (VOLTAREN) 1 % GEL Apply 2 g topically 4 (four) times daily as needed (JOINT PAIN).     Marland Kitchen gabapentin (NEURONTIN) 600 MG tablet Take 600 mg by mouth 2 (two) times daily.    Marland Kitchen glimepiride (AMARYL) 4 MG tablet Take 4 mg by mouth daily with breakfast.     . glipiZIDE (GLUCOTROL XL) 5 MG 24 hr tablet Take 5 mg by mouth daily with breakfast.    . glucose blood (ACCU-CHEK ACTIVE STRIPS) test strip Use as instructed 100 each 12  . glucose monitoring kit (FREESTYLE) monitoring kit 1 each by Does not apply route 4 (four) times daily - after meals and at bedtime. 1 month Diabetic Testing Supplies for QAC-QHS accuchecks. 1 each 1  . insulin glargine (LANTUS) 100 unit/mL SOPN Inject 0.3 mLs (30 Units total) into the skin at bedtime. 15 mL 3  .  Insulin Pen Needle 29G X 91YN MISC 1 application by Does not apply route at bedtime. 100 each 0  . Lancets (ACCU-CHEK SOFT TOUCH) lancets Use as instructed 100 each 12  . latanoprost (XALATAN) 0.005 % ophthalmic solution Place 1 drop into both eyes at bedtime.    Marland Kitchen lisinopril-hydrochlorothiazide (PRINZIDE,ZESTORETIC) 20-25 MG per tablet Take 1 tablet by mouth daily.    . pravastatin (PRAVACHOL) 80 MG tablet Take 80 mg by mouth at bedtime.    . sertraline (ZOLOFT) 100 MG tablet Take 100 mg by mouth at bedtime.     Marland Kitchen tetrahydrozoline-zinc (VISINE-AC) 0.05-0.25 % ophthalmic solution Place 2 drops into both eyes 3 (three) times daily as needed (DRY EYES).      . hydrALAZINE (APRESOLINE) 50 MG tablet Take 1 tablet (50 mg total) by mouth 3 (three) times daily. 90 tablet 0  . HYDROcodone-acetaminophen (NORCO/VICODIN) 5-325 MG tablet TAKE 1 TABLET EVERY 8 HOURS AS MEED FOR PAIN  0  . HYDROcodone-acetaminophen (NORCO/VICODIN) 5-325 MG tablet Take 1-2 tablets by mouth every 4 (four) hours as needed for moderate pain. 20 tablet 0   No facility-administered medications prior to visit.      Allergies:   Codeine   Social History   Social History  . Marital status: Single    Spouse name: N/A  . Number of children: N/A  . Years of education: N/A   Social History Main Topics  . Smoking status: Former Smoker    Packs/day: 1.00    Years: 30.00    Types: Cigarettes    Quit date: 01/24/2005  . Smokeless tobacco: Never Used  . Alcohol use No  . Drug use: No  . Sexual activity: Not Asked   Other Topics Concern  . None   Social History Narrative  . None     Family History:  The patient's family history includes Diabetes in her sister and son.   ROS:   Please see the history of present illness.    ROS All other systems reviewed and are negative.   PHYSICAL EXAM:   VS:  BP 130/82   Pulse 68   Ht 5' 2"  (1.575 m)   Wt 186 lb 12.8 oz (84.7 kg)   LMP  (LMP Unknown)   BMI 34.17 kg/m    GEN: Well nourished, well developed, in no acute distress  HEENT: normal  Neck: no JVD, carotid bruits, or masses Cardiac: RRR; no murmurs, rubs, or gallops,no edema  Respiratory:  clear to auscultation bilaterally, normal work of breathing GI: soft, nontender, nondistended, + BS MS: no deformity or atrophy  Skin: warm and dry, no rash Neuro:  Alert and Oriented x 3, Strength and sensation are intact Psych: euthymic mood, full affect  Wt Readings from Last 3 Encounters:  03/15/16 186 lb 12.8 oz (84.7 kg)  12/31/15 180 lb (81.6 kg)  12/28/15 180 lb (81.6 kg)      Studies/Labs Reviewed:   EKG:   No new EKG  Recent Labs: 08/29/2015: TSH  0.619 11/09/2015: ALT 17 12/28/2015: BUN 35; Creatinine, Ser 1.10; Hemoglobin 11.9; Platelets 306; Potassium 3.2; Sodium 138   Lipid Panel    Component Value Date/Time   CHOL 205 (H) 05/05/2009 2132   TRIG 98 05/05/2009 2132   HDL 43 05/05/2009 2132   CHOLHDL 4.8 Ratio 05/05/2009 2132   VLDL 20 05/05/2009 2132   LDLCALC 142 (H) 05/05/2009 2132    Additional studies/ records that were reviewed today include:   Prior EKG's reviewed personally  ECHO 11/10/15:  - Left ventricle: The cavity size was normal. Systolic function was   normal. The estimated ejection fraction was in the range of 55%   to 60%. Wall motion was normal; there were no regional wall   motion abnormalities. Features are consistent with a pseudonormal   left ventricular filling pattern, with concomitant abnormal   relaxation and increased filling pressure (grade 2 diastolic   dysfunction). - Left atrium: The atrium was moderately dilated. - Pulmonic valve: There was trivial regurgitation.    ASSESSMENT:    1. Sick sinus syndrome ([place])   2. PVCs (premature ventricular contractions)   3. Bradycardia   4. Hyperparathyroidism, primary ([place])   5. Type 2 diabetes mellitus with stage 1 chronic kidney disease, with long-term current use of insulin ([facility])      PLAN:  In order of problems listed above:  Bradycardia  - in 10/17, atenolol and clonidine stopped. Hydralazine started at that time. Continuing with amlodipine, lisinopril HCTZ.     PVC  - episodes of bigeminy and trigeminy, on auscultation today, no ectopy.  DM uncontrolled  - A1c 10, working with primary physician.  CKD 3  - Creat 1.6-1.35  - On lisinopril.  Hyperparathyroidism  - Post surgery, Dr. Harlow Asa  - Doing well.   Medication Adjustments/Labs and Tests Ordered: Current medicines are reviewed at length with the patient today.  Concerns regarding medicines are outlined above.  Medication changes, Labs and Tests ordered today are listed  in the Patient Instructions below. Patient Instructions  Medication Instructions:  ,conti  Follow-Up: Follow up as needed with Dr Marlou Porch.  Thank you for choosing [facility]!!        Signed, Candee Furbish, MD  03/15/2016 8:51 AM    [address], [place], [place]  [address] Phone: [phone]; Fax: [phone]

## 2016-03-15 NOTE — Patient Instructions (Signed)
Medication Instructions:  ,conti  Follow-Up: Follow up as needed with Dr Marlou Porch.  Thank you for choosing [facility]!!

## 2016-03-30 ENCOUNTER — Other Ambulatory Visit: Payer: Self-pay | Admitting: Internal Medicine

## 2016-03-30 DIAGNOSIS — Z1231 Encounter for screening mammogram for malignant neoplasm of breast: Secondary | ICD-10-CM

## 2016-04-29 ENCOUNTER — Ambulatory Visit
Admission: RE | Admit: 2016-04-29 | Discharge: 2016-04-29 | Disposition: A | Discharge status: Home / Self Care | Payer: Medicare Other | Source: Ambulatory Visit | Attending: Internal Medicine | Admitting: Internal Medicine

## 2016-04-29 ENCOUNTER — Other Ambulatory Visit: Payer: Self-pay | Admitting: Internal Medicine

## 2016-04-29 DIAGNOSIS — Z1231 Encounter for screening mammogram for malignant neoplasm of breast: Secondary | ICD-10-CM

## 2016-05-20 ENCOUNTER — Emergency Department (HOSPITAL_COMMUNITY): Payer: Medicare Other

## 2016-05-20 ENCOUNTER — Inpatient Hospital Stay (HOSPITAL_COMMUNITY)
Admission: EM | Admit: 2016-05-20 | Discharge: 2016-05-23 | DRG: 638 | Disposition: A | Discharge status: Home / Self Care | Payer: Medicare Other | Attending: Internal Medicine | Admitting: Internal Medicine

## 2016-05-20 ENCOUNTER — Encounter (HOSPITAL_COMMUNITY): Payer: Self-pay | Admitting: Emergency Medicine

## 2016-05-20 DIAGNOSIS — M1990 Unspecified osteoarthritis, unspecified site: Secondary | ICD-10-CM | POA: Diagnosis present

## 2016-05-20 DIAGNOSIS — Z794 Long term (current) use of insulin: Secondary | ICD-10-CM

## 2016-05-20 DIAGNOSIS — E669 Obesity, unspecified: Secondary | ICD-10-CM | POA: Diagnosis present

## 2016-05-20 DIAGNOSIS — E1101 Type 2 diabetes mellitus with hyperosmolarity with coma: Secondary | ICD-10-CM | POA: Diagnosis not present

## 2016-05-20 DIAGNOSIS — M25512 Pain in left shoulder: Secondary | ICD-10-CM | POA: Diagnosis present

## 2016-05-20 DIAGNOSIS — Z79899 Other long term (current) drug therapy: Secondary | ICD-10-CM

## 2016-05-20 DIAGNOSIS — R739 Hyperglycemia, unspecified: Secondary | ICD-10-CM

## 2016-05-20 DIAGNOSIS — Z9119 Patient's noncompliance with other medical treatment and regimen: Secondary | ICD-10-CM

## 2016-05-20 DIAGNOSIS — Z885 Allergy status to narcotic agent status: Secondary | ICD-10-CM

## 2016-05-20 DIAGNOSIS — I69354 Hemiplegia and hemiparesis following cerebral infarction affecting left non-dominant side: Secondary | ICD-10-CM

## 2016-05-20 DIAGNOSIS — I639 Cerebral infarction, unspecified: Secondary | ICD-10-CM | POA: Diagnosis present

## 2016-05-20 DIAGNOSIS — Z6832 Body mass index (BMI) 32.0-32.9, adult: Secondary | ICD-10-CM

## 2016-05-20 DIAGNOSIS — E1100 Type 2 diabetes mellitus with hyperosmolarity without nonketotic hyperglycemic-hyperosmolar coma (NKHHC): Principal | ICD-10-CM | POA: Diagnosis present

## 2016-05-20 DIAGNOSIS — Z87891 Personal history of nicotine dependence: Secondary | ICD-10-CM

## 2016-05-20 DIAGNOSIS — E1122 Type 2 diabetes mellitus with diabetic chronic kidney disease: Secondary | ICD-10-CM | POA: Diagnosis not present

## 2016-05-20 DIAGNOSIS — N179 Acute kidney failure, unspecified: Secondary | ICD-10-CM | POA: Diagnosis not present

## 2016-05-20 DIAGNOSIS — Z96652 Presence of left artificial knee joint: Secondary | ICD-10-CM | POA: Diagnosis present

## 2016-05-20 DIAGNOSIS — I10 Essential (primary) hypertension: Secondary | ICD-10-CM | POA: Diagnosis not present

## 2016-05-20 DIAGNOSIS — E1142 Type 2 diabetes mellitus with diabetic polyneuropathy: Secondary | ICD-10-CM | POA: Diagnosis present

## 2016-05-20 DIAGNOSIS — F418 Other specified anxiety disorders: Secondary | ICD-10-CM | POA: Diagnosis not present

## 2016-05-20 DIAGNOSIS — E785 Hyperlipidemia, unspecified: Secondary | ICD-10-CM | POA: Diagnosis not present

## 2016-05-20 DIAGNOSIS — R251 Tremor, unspecified: Secondary | ICD-10-CM | POA: Diagnosis present

## 2016-05-20 DIAGNOSIS — H409 Unspecified glaucoma: Secondary | ICD-10-CM | POA: Diagnosis present

## 2016-05-20 HISTORY — DX: Unspecified glaucoma: H40.9

## 2016-05-20 LAB — URINALYSIS, ROUTINE W REFLEX MICROSCOPIC
Bilirubin Urine: NEGATIVE
KETONES UR: 5 mg/dL — AB
Leukocytes, UA: NEGATIVE
NITRITE: NEGATIVE
PROTEIN: 100 mg/dL — AB
SPECIFIC GRAVITY, URINE: 1.023 (ref 1.005–1.030)
pH: 5 (ref 5.0–8.0)

## 2016-05-20 LAB — CBC WITH DIFFERENTIAL/PLATELET
BASOS PCT: 0 %
Basophils Absolute: 0 10*3/uL (ref 0.0–0.1)
Eosinophils Absolute: 0 10*3/uL (ref 0.0–0.7)
Eosinophils Relative: 1 %
HCT: 29.4 % — ABNORMAL LOW (ref 36.0–46.0)
HEMOGLOBIN: 10.5 g/dL — AB (ref 12.0–15.0)
LYMPHS PCT: 12 %
Lymphs Abs: 0.7 10*3/uL (ref 0.7–4.0)
MCH: 27.3 pg (ref 26.0–34.0)
MCHC: 35.7 g/dL (ref 30.0–36.0)
MCV: 76.6 fL — ABNORMAL LOW (ref 78.0–100.0)
MONO ABS: 0.5 10*3/uL (ref 0.1–1.0)
MONOS PCT: 8 %
NEUTROS ABS: 4.5 10*3/uL (ref 1.7–7.7)
NEUTROS PCT: 79 %
Platelets: 251 10*3/uL (ref 150–400)
RBC: 3.84 MIL/uL — ABNORMAL LOW (ref 3.87–5.11)
WBC: 5.7 10*3/uL (ref 4.0–10.5)

## 2016-05-20 LAB — BASIC METABOLIC PANEL
Anion gap: 20 — ABNORMAL HIGH (ref 5–15)
Anion gap: 13 (ref 5–15)
BUN: 37 mg/dL — ABNORMAL HIGH (ref 6–20)
BUN: 31 mg/dL — ABNORMAL HIGH (ref 6–20)
CALCIUM: 9.7 mg/dL (ref 8.9–10.3)
CHLORIDE: 80 mmol/L — AB (ref 101–111)
CHLORIDE: 98 mmol/L — AB (ref 101–111)
CO2: 23 mmol/L (ref 22–32)
CO2: 26 mmol/L (ref 22–32)
CREATININE: 1.85 mg/dL — AB (ref 0.44–1.00)
CREATININE: 1.61 mg/dL — AB (ref 0.44–1.00)
Calcium: 9.7 mg/dL (ref 8.9–10.3)
GFR calc non Af Amer: 32 mL/min — ABNORMAL LOW (ref >60)
GFR, EST AFRICAN AMERICAN: 31 mL/min — AB (ref >60)
GFR, EST AFRICAN AMERICAN: 37 mL/min — AB (ref >60)
GFR, EST NON AFRICAN AMERICAN: 27 mL/min — AB (ref >60)
Glucose, Bld: 1127 mg/dL (ref 65–99)
Glucose, Bld: 515 mg/dL (ref 65–99)
POTASSIUM: 4 mmol/L (ref 3.5–5.1)
Potassium: 3.1 mmol/L — ABNORMAL LOW (ref 3.5–5.1)
SODIUM: 123 mmol/L — AB (ref 135–145)
SODIUM: 137 mmol/L (ref 135–145)

## 2016-05-20 LAB — MAGNESIUM: MAGNESIUM: 2.7 mg/dL — AB (ref 1.7–2.4)

## 2016-05-20 LAB — CBG MONITORING, ED

## 2016-05-20 LAB — BETA-HYDROXYBUTYRIC ACID: BETA-HYDROXYBUTYRIC ACID: 0.46 mmol/L — AB (ref 0.05–0.27)

## 2016-05-20 LAB — GLUCOSE, CAPILLARY
GLUCOSE-CAPILLARY: 391 mg/dL — AB (ref 65–99)
Glucose-Capillary: 266 mg/dL — ABNORMAL HIGH (ref 65–99)
Glucose-Capillary: 271 mg/dL — ABNORMAL HIGH (ref 65–99)

## 2016-05-20 LAB — MRSA PCR SCREENING: MRSA BY PCR: NEGATIVE

## 2016-05-20 LAB — PHOSPHORUS: PHOSPHORUS: 1.6 mg/dL — AB (ref 2.5–4.6)

## 2016-05-20 MED ORDER — ENOXAPARIN SODIUM 40 MG/0.4ML ~~LOC~~ SOLN
40.0000 mg | SUBCUTANEOUS | Status: DC
  Administered 2016-05-20 – 2016-05-21 (×2): 40 mg via SUBCUTANEOUS
  Filled 2016-05-20 (×2): qty 0.4

## 2016-05-20 MED ORDER — SERTRALINE HCL 100 MG PO TABS
100.0000 mg | ORAL_TABLET | Freq: Every day | ORAL | Status: DC
  Administered 2016-05-20 – 2016-05-22 (×3): 100 mg via ORAL
  Filled 2016-05-20 (×3): qty 1

## 2016-05-20 MED ORDER — TIMOLOL MALEATE 0.5 % OP SOLN
1.0000 [drp] | Freq: Two times a day (BID) | OPHTHALMIC | Status: DC
  Administered 2016-05-21 – 2016-05-23 (×5): 1 [drp] via OPHTHALMIC
  Filled 2016-05-20: qty 5

## 2016-05-20 MED ORDER — BRIMONIDINE TARTRATE 0.2 % OP SOLN
1.0000 [drp] | Freq: Two times a day (BID) | OPHTHALMIC | Status: DC
  Administered 2016-05-21 – 2016-05-23 (×5): 1 [drp] via OPHTHALMIC
  Filled 2016-05-20: qty 5

## 2016-05-20 MED ORDER — SODIUM CHLORIDE 0.9 % IV BOLUS (SEPSIS)
1000.0000 mL | Freq: Once | INTRAVENOUS | Status: AC
  Administered 2016-05-20: 1000 mL via INTRAVENOUS

## 2016-05-20 MED ORDER — PRAVASTATIN SODIUM 40 MG PO TABS
80.0000 mg | ORAL_TABLET | Freq: Every day | ORAL | Status: DC
  Administered 2016-05-21 – 2016-05-22 (×2): 80 mg via ORAL
  Filled 2016-05-20 (×2): qty 2

## 2016-05-20 MED ORDER — ONDANSETRON HCL 4 MG/2ML IJ SOLN: 4.0000 mg | Freq: Four times a day (QID) | INTRAMUSCULAR | Status: DC | PRN

## 2016-05-20 MED ORDER — INSULIN ASPART 100 UNIT/ML ~~LOC~~ SOLN
15.0000 [IU] | Freq: Once | SUBCUTANEOUS | Status: AC
  Administered 2016-05-20: 15 [IU] via SUBCUTANEOUS
  Filled 2016-05-20: qty 1

## 2016-05-20 MED ORDER — POTASSIUM PHOSPHATES 15 MMOLE/5ML IV SOLN
20.0000 mmol | Freq: Once | INTRAVENOUS | Status: AC
  Administered 2016-05-20: 20 mmol via INTRAVENOUS
  Filled 2016-05-20: qty 6.67

## 2016-05-20 MED ORDER — ONDANSETRON HCL 4 MG PO TABS: 4.0000 mg | ORAL_TABLET | Freq: Four times a day (QID) | ORAL | Status: DC | PRN

## 2016-05-20 MED ORDER — SODIUM CHLORIDE 0.9 % IV SOLN: 30.0000 meq | Freq: Once | INTRAVENOUS | Status: DC

## 2016-05-20 MED ORDER — BRIMONIDINE TARTRATE-TIMOLOL 0.2-0.5 % OP SOLN
1.0000 [drp] | Freq: Two times a day (BID) | OPHTHALMIC | Status: DC
  Filled 2016-05-20: qty 5

## 2016-05-20 MED ORDER — AMLODIPINE BESYLATE 10 MG PO TABS
10.0000 mg | ORAL_TABLET | Freq: Every day | ORAL | Status: DC
  Administered 2016-05-21 – 2016-05-23 (×3): 10 mg via ORAL
  Filled 2016-05-20 (×3): qty 1

## 2016-05-20 MED ORDER — ALLOPURINOL 100 MG PO TABS
100.0000 mg | ORAL_TABLET | Freq: Two times a day (BID) | ORAL | Status: DC
  Administered 2016-05-20 – 2016-05-23 (×6): 100 mg via ORAL
  Filled 2016-05-20 (×6): qty 1

## 2016-05-20 MED ORDER — POTASSIUM CHLORIDE 10 MEQ/100ML IV SOLN
10.0000 meq | INTRAVENOUS | Status: AC
  Administered 2016-05-20 – 2016-05-21 (×3): 10 meq via INTRAVENOUS
  Filled 2016-05-20 (×3): qty 100

## 2016-05-20 MED ORDER — CLONAZEPAM 1 MG PO TABS
1.0000 mg | ORAL_TABLET | Freq: Two times a day (BID) | ORAL | Status: DC | PRN
  Administered 2016-05-21 – 2016-05-22 (×2): 1 mg via ORAL
  Filled 2016-05-20 (×2): qty 1

## 2016-05-20 MED ORDER — DEXTROSE-NACL 5-0.45 % IV SOLN
INTRAVENOUS | Status: DC
  Administered 2016-05-21: 01:00:00 via INTRAVENOUS

## 2016-05-20 MED ORDER — GABAPENTIN 300 MG PO CAPS
600.0000 mg | ORAL_CAPSULE | Freq: Two times a day (BID) | ORAL | Status: DC
  Administered 2016-05-20 – 2016-05-23 (×6): 600 mg via ORAL
  Filled 2016-05-20 (×6): qty 2

## 2016-05-20 MED ORDER — ORAL CARE MOUTH RINSE
15.0000 mL | Freq: Two times a day (BID) | OROMUCOSAL | Status: DC
  Administered 2016-05-20 – 2016-05-22 (×4): 15 mL via OROMUCOSAL

## 2016-05-20 MED ORDER — SODIUM CHLORIDE 0.9 % IV SOLN
INTRAVENOUS | Status: DC
  Administered 2016-05-20: 9.7 [IU]/h via INTRAVENOUS
  Filled 2016-05-20: qty 2.5

## 2016-05-20 MED ORDER — IBUPROFEN 200 MG PO TABS
600.0000 mg | ORAL_TABLET | Freq: Once | ORAL | Status: AC
  Administered 2016-05-20: 600 mg via ORAL
  Filled 2016-05-20: qty 3

## 2016-05-20 MED ORDER — SODIUM CHLORIDE 0.9 % IV SOLN
Freq: Once | INTRAVENOUS | Status: AC
  Administered 2016-05-20: 20:00:00 via INTRAVENOUS

## 2016-05-20 MED ORDER — SODIUM CHLORIDE 0.45 % IV SOLN
INTRAVENOUS | Status: DC
  Administered 2016-05-20: 21:00:00 via INTRAVENOUS

## 2016-05-20 MED ORDER — SODIUM CHLORIDE 0.9 % IV SOLN
INTRAVENOUS | Status: DC
  Administered 2016-05-20: 5.4 [IU]/h via INTRAVENOUS
  Filled 2016-05-20: qty 2.5

## 2016-05-20 MED ORDER — DEXTROSE 50 % IV SOLN: 25.0000 mL | INTRAVENOUS | Status: DC | PRN

## 2016-05-20 NOTE — ED Notes (Signed)
Lavender top tube redrawn and sent to lab

## 2016-05-20 NOTE — ED Notes (Signed)
Pt attempted to get urine sample, but missed cup

## 2016-05-20 NOTE — ED Notes (Signed)
Apolonio Schneiders, RN notified of glucose 1127 by patty d., RN via radio.

## 2016-05-20 NOTE — H&P (Signed)
History and Physical    Caitlin Morris DVV:[id number] DOB: 05/20/1950 DOA: 05/20/2016  PCP: Benito Mccreedy, MD   Patient coming from: Home.  I have personally briefly reviewed patient's old medical records in [place]  Chief Complaint: Left forearm pain.  HPI: Caitlin Morris is a 66 y.o. female with medical history significant of depression with anxiety, also arthritis, cataracts, glaucoma depression, type 2 diabetes, hyperlipidemia, hypertension, obesity, history of stroke with residual left-sided weakness who is brought emergency department via EMS after having a fall three days ago with complaints of left forearm pain and tremor with motion of left upper extremity.  Incidentally, the patient was found to have a CBG reading high by EMS and a blood glucose measurement of 1127 mg/dL on chemistry labs.  Apparently she has been off her insulin for several days and has not taken her oral hypoglycemic in about three weeks or so.  She has been having blurred vision, polyuria and polydipsia, but is unable to clarify since when.  She denies chest pain, palpitations, dizziness, dyspnea, abdominal pain, nausea, emesis, diarrhea, constipation, melena or hematochezia. She denies hematuria, dysuria or frequency.  ED Course: The patient received 15 units of insulin SQ, 2000 mL of normal saline bolus and  was started on an insulin infusion. Urinalysis showed glucosuria, proteinuria rare bacteria and ketones 5 mg/L. WBC was 5.7, hemoglobin 10.5 g/dL and platelets 251. Her sodium was 123, corrected to glucose was 148, potassium 4.0, chloride 80, bicarbonate 23 mmol/L. BUN is 37, creatinine 1.95 and glucose 1127 mg/dL.   Review of Systems: As per HPI otherwise 10 point review of systems negative.    Past Medical History:  Diagnosis Date  . Anxiety   . Arthritis    arthritis left foot and gout  . Cataract    not surgical yet  . Depression   . Diabetes mellitus without complication ([place])   .  Hyperlipidemia   . Hypertension   . Obesity   . Stroke [facility])    left sided weakness remains-ambulates with cane.    Past Surgical History:  Procedure Laterality Date  . JOINT REPLACEMENT     Left TKA  . PARATHYROIDECTOMY Left 12/31/2015   Procedure: LEFT MINIMALLY INVASIVE PARATHYROIDECTOMY;  Surgeon: Armandina Gemma, MD;  Location: [facility];  Service: General;  Laterality: Left;  . TOTAL KNEE ARTHROPLASTY Left 10/01/2013   Procedure: TOTAL KNEE ARTHROPLASTY;  Surgeon: Nita Sells, MD;  Location: [place];  Service: Orthopedics;  Laterality: Left;  Left total knee arthroplasty  . TUBAL LIGATION       reports that she quit smoking about 11 years ago. Her smoking use included Cigarettes. She has a 30.00 pack-year smoking history. She has never used smokeless tobacco. She reports that she does not drink alcohol or use drugs.  Allergies  Allergen Reactions  . Codeine Hives and Swelling    Family History  Problem Relation Age of Onset  . Diabetes Sister   . Diabetes Son   . Breast cancer Cousin     Prior to Admission medications   Medication Sig Start Date End Date Taking? Authorizing Provider  allopurinol (ZYLOPRIM) 100 MG tablet Take 100 mg by mouth 2 (two) times daily.  10/22/15  Yes Historical Provider, MD  amLODipine (NORVASC) 10 MG tablet Take 10 mg by mouth daily.   Yes Historical Provider, MD  clonazePAM (KLONOPIN) 1 MG tablet Take 1 mg by mouth at bedtime.    Yes Historical Provider, MD  COMBIGAN 0.2-0.5 %  ophthalmic solution Place 1 drop into both eyes every 12 (twelve) hours.  04/18/16  Yes Historical Provider, MD  gabapentin (NEURONTIN) 600 MG tablet Take 600 mg by mouth 2 (two) times daily.   Yes Historical Provider, MD  glimepiride (AMARYL) 4 MG tablet Take 4 mg by mouth daily with breakfast.  08/23/15  Yes Historical Provider, MD  glipiZIDE (GLUCOTROL XL) 5 MG 24 hr tablet Take 5 mg by mouth daily with breakfast.   Yes Historical Provider, MD  glucose blood (ACCU-CHEK  ACTIVE STRIPS) test strip Use as instructed 11/11/15  Yes Nishant Dhungel, MD  glucose monitoring kit (FREESTYLE) monitoring kit 1 each by Does not apply route 4 (four) times daily - after meals and at bedtime. 1 month Diabetic Testing Supplies for QAC-QHS accuchecks. 11/11/15  Yes Nishant Dhungel, MD  insulin glargine (LANTUS) 100 unit/mL SOPN Inject 0.3 mLs (30 Units total) into the skin at bedtime. 11/11/15  Yes Nishant Dhungel, MD  Insulin Pen Needle 29G X 33AS MISC 1 application by Does not apply route at bedtime. 11/11/15  Yes Nishant Dhungel, MD  Lancets (ACCU-CHEK SOFT TOUCH) lancets Use as instructed 11/11/15  Yes Nishant Dhungel, MD  lisinopril-hydrochlorothiazide (PRINZIDE,ZESTORETIC) 20-25 MG per tablet Take 1 tablet by mouth daily.   Yes Historical Provider, MD  pravastatin (PRAVACHOL) 80 MG tablet Take 80 mg by mouth at bedtime. 07/10/15  Yes Historical Provider, MD  sertraline (ZOLOFT) 100 MG tablet Take 100 mg by mouth at bedtime.    Yes Historical Provider, MD  tetrahydrozoline-zinc (VISINE-AC) 0.05-0.25 % ophthalmic solution Place 2 drops into both eyes 3 (three) times daily as needed (DRY EYES).   Yes Historical Provider, MD    Physical Exam: Constitutional: NAD, calm, comfortable Vitals:   05/20/16 1527 05/20/16 1528 05/20/16 1709  BP: 131/86  (!) 148/78  Pulse: 74  61  Resp: 16  16  Temp: 98.2 F (36.8 C)    TempSrc: Oral    SpO2: 96%  98%  Weight:  82.6 kg (182 lb)    Eyes: PERRL, lids and conjunctivae normal ENMT: Mucous membranes and lips are dry. Posterior pharynx clear of any exudate or lesions. Neck: normal, supple, no masses, no thyromegaly Respiratory: clear to auscultation bilaterally, no wheezing, no crackles. Normal respiratory effort. No accessory muscle use.  Cardiovascular: Regular rate and rhythm, no murmurs / rubs / gallops. No extremity edema. 2+ pedal pulses. No carotid bruits.  Abdomen: Soft, no tenderness, no masses palpated. No hepatosplenomegaly.  Bowel sounds positive.  Musculoskeletal: no clubbing / cyanosis. Good ROM, no contractures. Normal muscle tone.  Skin: no rashes, lesions, ulcers on limited skin exam. Neurologic: CN 2-12 grossly intact. Sensation intact, DTR normal. Left-sided 4/5 hemiparesis  Psychiatric: Normal judgment and insight. Alert and oriented x 3. Normal mood.    Labs on Admission: I have personally reviewed following labs and imaging studies  CBC:  Recent Labs Lab 05/20/16 1727  WBC 5.7  NEUTROABS 4.5  HGB 10.5*  HCT 29.4*  MCV 76.6*  PLT 505   Basic Metabolic Panel:  Recent Labs Lab 05/20/16 1540  NA 123*  K 4.0  CL 80*  CO2 23  GLUCOSE 1,127*  BUN 37*  CREATININE 1.85*  CALCIUM 9.7   GFR: Estimated Creatinine Clearance: 29.4 mL/min (A) (by C-G formula based on SCr of 1.85 mg/dL (H)). Liver Function Tests: No results for input(s): AST, ALT, ALKPHOS, BILITOT, PROT, ALBUMIN in the last 168 hours. No results for input(s): LIPASE, AMYLASE in the last 168 hours.  No results for input(s): AMMONIA in the last 168 hours. Coagulation Profile: No results for input(s): INR, PROTIME in the last 168 hours. Cardiac Enzymes: No results for input(s): CKTOTAL, CKMB, CKMBINDEX, TROPONINI in the last 168 hours. BNP (last 3 results) No results for input(s): PROBNP in the last 8760 hours. HbA1C: No results for input(s): HGBA1C in the last 72 hours. CBG:  Recent Labs Lab 05/20/16 1617 05/20/16 1805 05/20/16 1912  GLUCAP >600* >600* >600*   Lipid Profile: No results for input(s): CHOL, HDL, LDLCALC, TRIG, CHOLHDL, LDLDIRECT in the last 72 hours. Thyroid Function Tests: No results for input(s): TSH, T4TOTAL, FREET4, T3FREE, THYROIDAB in the last 72 hours. Anemia Panel: No results for input(s): VITAMINB12, FOLATE, FERRITIN, TIBC, IRON, RETICCTPCT in the last 72 hours. Urine analysis:    Component Value Date/Time   COLORURINE STRAW (A) 05/20/2016 1639   APPEARANCEUR CLEAR 05/20/2016 1639    LABSPEC 1.023 05/20/2016 1639   PHURINE 5.0 05/20/2016 1639   GLUCOSEU >=500 (A) 05/20/2016 1639   HGBUR SMALL (A) 05/20/2016 1639   HGBUR negative 07/07/2009 0957   BILIRUBINUR NEGATIVE 05/20/2016 1639   KETONESUR 5 (A) 05/20/2016 1639   PROTEINUR 100 (A) 05/20/2016 1639   UROBILINOGEN 1.0 10/01/2013 0915   NITRITE NEGATIVE 05/20/2016 1639   LEUKOCYTESUR NEGATIVE 05/20/2016 1639    Radiological Exams on Admission: Dg Shoulder Left  Result Date: 05/20/2016 CLINICAL DATA:  Fall 3 days ago, left shoulder pain EXAM: LEFT SHOULDER - 2+ VIEW COMPARISON:  None. FINDINGS: No fracture or dislocation is seen. The joint spaces are preserved. Visualized soft tissues are within normal limits. Visualized left lung is clear. IMPRESSION: Negative. Electronically Signed   By: Julian Hy M.D.   On: 05/20/2016 17:03     Assessment/Plan Principal Problem:   Hyperosmolar non-ketotic state in patient with type 2 diabetes mellitus ([place]) Admit to stepdown/inpatient. Continue IV hydration. Continue insulin infusion. Keep nothing by mouth. Monitor CBG every hour when necessary Check magnesium and phosphorus level. BMP every 4 hours. Correct electrolytes as needed.  Active Problems:   AKI (acute kidney injury) ([place]) Continue IV hydration. Hold ACE inhibitor and diuretic. Monitor intake and output. Follow-up BUN, creatinine and electrolytes tomorrow.    Polyneuropathy due to type 2 diabetes mellitus (HCC) Continue gabapentin 600 mg by mouth twice a day.    Essential hypertension Continue amlodipine 10 mg by mouth daily. Hold lisinopril/hydrochlorothiazide.    Depression with anxiety Continue sertraline 100 mg by mouth at bedtime. Continue clonazepam 1 mg by mouth 3 times a day when necessary.    Hyperlipidemia Resume pravastatin 80 mg by mouth daily tomorrow.    Stroke [facility]) Patient normally walks with a cane. Supportive care.    Arthritis Analgesics as needed.    DVT  prophylaxis: Lovenox SQ. Code Status: Full code. Family Communication: Her son Juanda Crumble was present in the room. Disposition Plan: Admit for IV hydration and glucose infusion. Consults called:  Admission status: Inpatient/stepdown.   Reubin Milan MD [facility] Pager [phone].  If 7PM-7AM, please contact night-coverage [email or web address] Password [facility]  05/20/2016, 7:55 PM

## 2016-05-20 NOTE — ED Provider Notes (Signed)
[name] DEPT Provider Note   CSN: [id number] Arrival date & time: 05/20/16  1513     History   Chief Complaint Chief Complaint  Patient presents with  . Hyperglycemia  . Arm Pain    HPI Caitlin Morris is a 66 y.o. female.  The history is provided by the patient.  Hyperglycemia  Blood sugar level PTA:  "high" Severity:  Severe Onset quality:  Gradual Duration:  1 week Timing:  Constant Progression:  Worsening Chronicity:  New Diabetes status:  Controlled with insulin Time since last antidiabetic medication:  1 week Context: noncompliance (ran out of medication, )   Context: not change in medication   Relieved by:  Nothing Ineffective treatments:  None tried Associated symptoms: increased thirst   Associated symptoms: no abdominal pain, no altered mental status, no chest pain, no confusion, no dehydration, no diaphoresis, no fever, no increased appetite, no nausea, no shortness of breath, no syncope and no weakness   Arm Pain  This is a new problem. The current episode started more than 2 days ago. The problem occurs constantly. The problem has not changed since onset.Pertinent negatives include no chest pain, no abdominal pain and no shortness of breath. Exacerbated by: movement of left arm. The symptoms are relieved by NSAIDs. She has tried ASA for the symptoms. The treatment provided mild relief.    66 year old female who presents with left arm pain after fall and hyperglycemia. States that she tripped while walking 3 days ago and fell forward she did not hit her head or have loss of consciousness. States that she pulled herself up and since then has been feeling sore all over. Primarily with left upper arm/shoulder pain, worse with movement and palpation. Did take a dose of ibuprofen which briefly helped her symptoms. No associated numbness or weakness.   States that 1 week ago she ran out of her insulin. She tried to call her doctor's office for refill, but no  prescription was sent. She has not had nausea, vomiting, abdominal pain, fevers, confusion, difficulty breathing or severe chest pain.    Past Medical History:  Diagnosis Date  . Anxiety   . Arthritis    arthritis left foot and gout  . Cataract    not surgical yet  . Depression   . Diabetes mellitus without complication ([place])   . Hyperlipidemia   . Hypertension   . Obesity   . Stroke [facility])    left sided weakness remains-ambulates with cane.    Patient Active Problem List   Diagnosis Date Noted  . Hyperosmolar non-ketotic state in patient with type 2 diabetes mellitus ([place]) 05/20/2016  . Type 2 diabetes mellitus, uncontrolled, with neuropathy ([place]) 11/10/2015  . Polyneuropathy due to type 2 diabetes mellitus ([place]) 11/10/2015  . Essential hypertension 11/10/2015  . Bradycardia 11/10/2015  . Depression with anxiety 11/10/2015  . CKD (chronic kidney disease) stage 3, GFR 30-59 ml/min 11/10/2015  . Diabetic nephropathy ([place]) 11/10/2015  . Uncontrolled diabetes mellitus ([place]) 11/10/2015  . Arrhythmia 11/09/2015  . Cardiac arrhythmia 11/09/2015  . Hyperparathyroidism, primary ([place]) 11/08/2015  . Avulsion fracture of ankle 10/15/2015  . Osteoarthritis of left knee 10/01/2013  . DENTAL PAIN 10/16/2009  . VAGINAL DISCHARGE 07/07/2009  . ARTHRALGIA 07/07/2009  . RENAL INSUFFICIENCY 06/21/2009  . DEGENERATIVE JOINT DISEASE, LEFT KNEE 05/21/2009  . KNEE PAIN, LEFT 05/05/2009  . WEIGHT GAIN 05/05/2009  . PROTEINURIA 05/05/2009  . BIPOLAR DISORDER UNSPECIFIED 04/04/2008  . HYPERTENSION 10/31/2007  . OSTEOPENIA 10/24/2005  .  Hypercalcemia 01/24/2005    Past Surgical History:  Procedure Laterality Date  . JOINT REPLACEMENT     Left TKA  . PARATHYROIDECTOMY Left 12/31/2015   Procedure: LEFT MINIMALLY INVASIVE PARATHYROIDECTOMY;  Surgeon: Armandina Gemma, MD;  Location: [facility];  Service: General;  Laterality: Left;  . TOTAL KNEE ARTHROPLASTY Left 10/01/2013   Procedure: TOTAL KNEE  ARTHROPLASTY;  Surgeon: Nita Sells, MD;  Location: [place];  Service: Orthopedics;  Laterality: Left;  Left total knee arthroplasty  . TUBAL LIGATION      OB History    No data available       Home Medications    Prior to Admission medications   Medication Sig Start Date End Date Taking? Authorizing Provider  allopurinol (ZYLOPRIM) 100 MG tablet Take 100 mg by mouth 2 (two) times daily.  10/22/15  Yes Historical Provider, MD  amLODipine (NORVASC) 10 MG tablet Take 10 mg by mouth daily.   Yes Historical Provider, MD  clonazePAM (KLONOPIN) 1 MG tablet Take 1 mg by mouth at bedtime.    Yes Historical Provider, MD  COMBIGAN 0.2-0.5 % ophthalmic solution Place 1 drop into both eyes every 12 (twelve) hours.  04/18/16  Yes Historical Provider, MD  gabapentin (NEURONTIN) 600 MG tablet Take 600 mg by mouth 2 (two) times daily.   Yes Historical Provider, MD  glimepiride (AMARYL) 4 MG tablet Take 4 mg by mouth daily with breakfast.  08/23/15  Yes Historical Provider, MD  glipiZIDE (GLUCOTROL XL) 5 MG 24 hr tablet Take 5 mg by mouth daily with breakfast.   Yes Historical Provider, MD  glucose blood (ACCU-CHEK ACTIVE STRIPS) test strip Use as instructed 11/11/15  Yes Nishant Dhungel, MD  glucose monitoring kit (FREESTYLE) monitoring kit 1 each by Does not apply route 4 (four) times daily - after meals and at bedtime. 1 month Diabetic Testing Supplies for QAC-QHS accuchecks. 11/11/15  Yes Nishant Dhungel, MD  insulin glargine (LANTUS) 100 unit/mL SOPN Inject 0.3 mLs (30 Units total) into the skin at bedtime. 11/11/15  Yes Nishant Dhungel, MD  Insulin Pen Needle 29G X 93OI MISC 1 application by Does not apply route at bedtime. 11/11/15  Yes Nishant Dhungel, MD  Lancets (ACCU-CHEK SOFT TOUCH) lancets Use as instructed 11/11/15  Yes Nishant Dhungel, MD  lisinopril-hydrochlorothiazide (PRINZIDE,ZESTORETIC) 20-25 MG per tablet Take 1 tablet by mouth daily.   Yes Historical Provider, MD  pravastatin  (PRAVACHOL) 80 MG tablet Take 80 mg by mouth at bedtime. 07/10/15  Yes Historical Provider, MD  sertraline (ZOLOFT) 100 MG tablet Take 100 mg by mouth at bedtime.    Yes Historical Provider, MD  tetrahydrozoline-zinc (VISINE-AC) 0.05-0.25 % ophthalmic solution Place 2 drops into both eyes 3 (three) times daily as needed (DRY EYES).   Yes Historical Provider, MD    Family History Family History  Problem Relation Age of Onset  . Diabetes Sister   . Diabetes Son   . Breast cancer Cousin     Social History Social History  Substance Use Topics  . Smoking status: Former Smoker    Packs/day: 1.00    Years: 30.00    Types: Cigarettes    Quit date: 01/24/2005  . Smokeless tobacco: Never Used  . Alcohol use No     Allergies   Codeine   Review of Systems Review of Systems  Constitutional: Negative for diaphoresis and fever.  HENT: Negative for congestion.   Respiratory: Negative for shortness of breath.   Cardiovascular: Negative for chest pain and  syncope.  Gastrointestinal: Negative for abdominal pain and nausea.  Endocrine: Positive for polydipsia.  Neurological: Negative for weakness.  Psychiatric/Behavioral: Negative for confusion.  All other systems reviewed and are negative.    Physical Exam Updated Vital Signs BP (!) 148/78   Pulse 61   Temp 98.2 F (36.8 C) (Oral)   Resp 16   Wt 182 lb (82.6 kg)   LMP  (LMP Unknown)   SpO2 98%   BMI 33.29 kg/m   Physical Exam Physical Exam  Nursing note and vitals reviewed. Constitutional:  non-toxic, and in no acute distress Head: Normocephalic and atraumatic.  Mouth/Throat: Oropharynx is clear and moist.  Neck: Normal range of motion. Neck supple.  Cardiovascular: Normal rate and regular rhythm.   Pulmonary/Chest: Effort normal and breath sounds normal.  Abdominal: Soft. There is no tenderness. There is no rebound and no guarding.  Musculoskeletal: No deformities. Tenderness of the left shoulder with range of motion and  palpation.  Neurological: Alert, no facial droop, fluent speech, moves all extremities symmetrically, equal bilateral hand grip, sensation to light touch intact throughout Skin: Skin is warm and dry.  Psychiatric: Cooperative   ED Treatments / Results  Labs (all labs ordered are listed, but only abnormal results are displayed) Labs Reviewed  BASIC METABOLIC PANEL - Abnormal; Notable for the following:       Result Value   Sodium 123 (*)    Chloride 80 (*)    Glucose, Bld 1,127 (*)    BUN 37 (*)    Creatinine, Ser 1.85 (*)    GFR calc non Af Amer 27 (*)    GFR calc Af Amer 31 (*)    Anion gap 20 (*)    All other components within normal limits  URINALYSIS, ROUTINE W REFLEX MICROSCOPIC - Abnormal; Notable for the following:    Color, Urine STRAW (*)    Glucose, UA >=500 (*)    Hgb urine dipstick SMALL (*)    Ketones, ur 5 (*)    Protein, ur 100 (*)    Bacteria, UA RARE (*)    Squamous Epithelial / LPF 0-5 (*)    All other components within normal limits  CBC WITH DIFFERENTIAL/PLATELET - Abnormal; Notable for the following:    RBC 3.84 (*)    Hemoglobin 10.5 (*)    HCT 29.4 (*)    MCV 76.6 (*)    All other components within normal limits  CBG MONITORING, ED - Abnormal; Notable for the following:    Glucose-Capillary >600 (*)    All other components within normal limits  CBG MONITORING, ED - Abnormal; Notable for the following:    Glucose-Capillary >600 (*)    All other components within normal limits  CBG MONITORING, ED - Abnormal; Notable for the following:    Glucose-Capillary >600 (*)    All other components within normal limits  BETA-HYDROXYBUTYRIC ACID    EKG  EKG Interpretation None       Radiology Dg Shoulder Left  Result Date: 05/20/2016 CLINICAL DATA:  Fall 3 days ago, left shoulder pain EXAM: LEFT SHOULDER - 2+ VIEW COMPARISON:  None. FINDINGS: No fracture or dislocation is seen. The joint spaces are preserved. Visualized soft tissues are within normal  limits. Visualized left lung is clear. IMPRESSION: Negative. Electronically Signed   By: Julian Hy M.D.   On: 05/20/2016 17:03    Procedures Procedures (including critical care time) CRITICAL CARE Performed by: Forde Dandy   Total critical care time:  35 minutes  Critical care time was exclusive of separately billable procedures and treating other patients.  Critical care was necessary to treat or prevent imminent or life-threatening deterioration.  Critical care was time spent personally by me on the following activities: development of treatment plan with patient and/or surrogate as well as nursing, discussions with consultants, evaluation of patient's response to treatment, examination of patient, obtaining history from patient or surrogate, ordering and performing treatments and interventions, ordering and review of laboratory studies, ordering and review of radiographic studies, pulse oximetry and re-evaluation of patient's condition.  Medications Ordered in ED Medications  insulin regular (NOVOLIN R,HUMULIN R) 250 Units in sodium chloride 0.9 % 250 mL (1 Units/mL) infusion (not administered)  0.9 %  sodium chloride infusion (not administered)  ibuprofen (ADVIL,MOTRIN) tablet 600 mg (600 mg Oral Given 05/20/16 1601)  sodium chloride 0.9 % bolus 1,000 mL (0 mLs Intravenous Stopped 05/20/16 1708)  sodium chloride 0.9 % bolus 1,000 mL (0 mLs Intravenous Stopped 05/20/16 1807)  insulin aspart (novoLOG) injection 15 Units (15 Units Subcutaneous Given 05/20/16 1800)     Initial Impression / Assessment and Plan / ED Course  I have reviewed the triage vital signs and the nursing notes.  Pertinent labs & imaging results that were available during my care of the patient were reviewed by me and considered in my medical decision making (see chart for details).     Presenting after mechanical fall with left shoulder pain. Extremity neurovascularly intact. X-rays visualized and shows no  evidence of fracture.  She does have hyperglycemia here, in the setting of not having her insulin for about a week. Her glucose is 1100. She has a normal bicarbonate although does have small ketones in the urine and anion gap of 20. He is not fully consistent with DKA. She did receive IV fluids and dose of aspart, but unlikely to significant affect blood sugar. She is placed on insulin drip. Discussed with Dr. Olevia Bowens will admit for ongoing management.  Final Clinical Impressions(s) / ED Diagnoses   Final diagnoses:  Hyperglycemia  Acute pain of left shoulder    New Prescriptions New Prescriptions   No medications on file     Forde Dandy, MD 05/20/16 1933

## 2016-05-20 NOTE — ED Triage Notes (Addendum)
Per EMS. Pt from home. Called out for L forearm pain since fall on Tuesday. Pt reports increased pain and tremor when moving arm.  EMS also checked CBG since pt had a Hx of DM. Meter read "high." Pt reports she has been out of oral DM medication for 3 weeks, and ran out of insulin 3 days ago.

## 2016-05-20 NOTE — ED Notes (Signed)
Bedside reporting done with Apolonio Schneiders, RN

## 2016-05-21 DIAGNOSIS — F418 Other specified anxiety disorders: Secondary | ICD-10-CM

## 2016-05-21 DIAGNOSIS — N179 Acute kidney failure, unspecified: Secondary | ICD-10-CM | POA: Diagnosis not present

## 2016-05-21 DIAGNOSIS — E1100 Type 2 diabetes mellitus with hyperosmolarity without nonketotic hyperglycemic-hyperosmolar coma (NKHHC): Secondary | ICD-10-CM | POA: Diagnosis not present

## 2016-05-21 DIAGNOSIS — M1990 Unspecified osteoarthritis, unspecified site: Secondary | ICD-10-CM

## 2016-05-21 DIAGNOSIS — I10 Essential (primary) hypertension: Secondary | ICD-10-CM

## 2016-05-21 DIAGNOSIS — R739 Hyperglycemia, unspecified: Secondary | ICD-10-CM | POA: Diagnosis not present

## 2016-05-21 DIAGNOSIS — E1101 Type 2 diabetes mellitus with hyperosmolarity with coma: Secondary | ICD-10-CM | POA: Diagnosis not present

## 2016-05-21 LAB — PHOSPHORUS: Phosphorus: 2.8 mg/dL (ref 2.5–4.6)

## 2016-05-21 LAB — COMPREHENSIVE METABOLIC PANEL
ALBUMIN: 3.4 g/dL — AB (ref 3.5–5.0)
ALT: 13 U/L — ABNORMAL LOW (ref 14–54)
AST: 20 U/L (ref 15–41)
Alkaline Phosphatase: 108 U/L (ref 38–126)
Anion gap: 11 (ref 5–15)
BILIRUBIN TOTAL: 0.7 mg/dL (ref 0.3–1.2)
BUN: 30 mg/dL — ABNORMAL HIGH (ref 6–20)
CO2: 25 mmol/L (ref 22–32)
Calcium: 9 mg/dL (ref 8.9–10.3)
Chloride: 101 mmol/L (ref 101–111)
Creatinine, Ser: 1.53 mg/dL — ABNORMAL HIGH (ref 0.44–1.00)
GFR, EST AFRICAN AMERICAN: 39 mL/min — AB (ref >60)
GFR, EST NON AFRICAN AMERICAN: 34 mL/min — AB (ref >60)
Glucose, Bld: 159 mg/dL — ABNORMAL HIGH (ref 65–99)
Potassium: 3.3 mmol/L — ABNORMAL LOW (ref 3.5–5.1)
Sodium: 137 mmol/L (ref 135–145)
TOTAL PROTEIN: 6.7 g/dL (ref 6.5–8.1)

## 2016-05-21 LAB — GLUCOSE, CAPILLARY
GLUCOSE-CAPILLARY: 231 mg/dL — AB (ref 65–99)
GLUCOSE-CAPILLARY: 202 mg/dL — AB (ref 65–99)
GLUCOSE-CAPILLARY: 176 mg/dL — AB (ref 65–99)
GLUCOSE-CAPILLARY: 167 mg/dL — AB (ref 65–99)
GLUCOSE-CAPILLARY: 147 mg/dL — AB (ref 65–99)
GLUCOSE-CAPILLARY: 142 mg/dL — AB (ref 65–99)
GLUCOSE-CAPILLARY: 159 mg/dL — AB (ref 65–99)
GLUCOSE-CAPILLARY: 162 mg/dL — AB (ref 65–99)
GLUCOSE-CAPILLARY: 206 mg/dL — AB (ref 65–99)
GLUCOSE-CAPILLARY: 197 mg/dL — AB (ref 65–99)
Glucose-Capillary: 133 mg/dL — ABNORMAL HIGH (ref 65–99)
Glucose-Capillary: 145 mg/dL — ABNORMAL HIGH (ref 65–99)
Glucose-Capillary: 154 mg/dL — ABNORMAL HIGH (ref 65–99)
Glucose-Capillary: 154 mg/dL — ABNORMAL HIGH (ref 65–99)
Glucose-Capillary: 237 mg/dL — ABNORMAL HIGH (ref 65–99)

## 2016-05-21 LAB — CBC WITH DIFFERENTIAL/PLATELET
BASOS ABS: 0 10*3/uL (ref 0.0–0.1)
Basophils Relative: 0 %
EOS PCT: 6 %
Eosinophils Absolute: 0.3 10*3/uL (ref 0.0–0.7)
HEMATOCRIT: 36.2 % (ref 36.0–46.0)
Hemoglobin: 12.3 g/dL (ref 12.0–15.0)
LYMPHS PCT: 30 %
Lymphs Abs: 1.8 10*3/uL (ref 0.7–4.0)
MCH: 26.5 pg (ref 26.0–34.0)
MCHC: 34 g/dL (ref 30.0–36.0)
MCV: 78 fL (ref 78.0–100.0)
MONO ABS: 0.4 10*3/uL (ref 0.1–1.0)
MONOS PCT: 7 %
Neutro Abs: 3.5 10*3/uL (ref 1.7–7.7)
Neutrophils Relative %: 57 %
PLATELETS: 260 10*3/uL (ref 150–400)
RBC: 4.64 MIL/uL (ref 3.87–5.11)
RDW: 14 % (ref 11.5–15.5)
WBC: 6 10*3/uL (ref 4.0–10.5)

## 2016-05-21 LAB — BASIC METABOLIC PANEL
ANION GAP: 10 (ref 5–15)
BUN: 30 mg/dL — ABNORMAL HIGH (ref 6–20)
CALCIUM: 9.1 mg/dL (ref 8.9–10.3)
CO2: 26 mmol/L (ref 22–32)
Chloride: 100 mmol/L — ABNORMAL LOW (ref 101–111)
Creatinine, Ser: 1.54 mg/dL — ABNORMAL HIGH (ref 0.44–1.00)
GFR, EST AFRICAN AMERICAN: 39 mL/min — AB (ref >60)
GFR, EST NON AFRICAN AMERICAN: 34 mL/min — AB (ref >60)
GLUCOSE: 234 mg/dL — AB (ref 65–99)
Potassium: 3.8 mmol/L (ref 3.5–5.1)
SODIUM: 136 mmol/L (ref 135–145)

## 2016-05-21 MED ORDER — POTASSIUM CHLORIDE CRYS ER 20 MEQ PO TBCR
40.0000 meq | EXTENDED_RELEASE_TABLET | Freq: Once | ORAL | Status: AC
  Administered 2016-05-21: 40 meq via ORAL
  Filled 2016-05-21: qty 2

## 2016-05-21 MED ORDER — INSULIN GLARGINE 100 UNIT/ML ~~LOC~~ SOLN
30.0000 [IU] | Freq: Every day | SUBCUTANEOUS | Status: DC
  Administered 2016-05-21 – 2016-05-22 (×2): 30 [IU] via SUBCUTANEOUS
  Filled 2016-05-21 (×2): qty 0.3

## 2016-05-21 MED ORDER — INSULIN ASPART 100 UNIT/ML ~~LOC~~ SOLN
0.0000 [IU] | Freq: Three times a day (TID) | SUBCUTANEOUS | Status: DC
  Administered 2016-05-21: 2 [IU] via SUBCUTANEOUS
  Administered 2016-05-21: 11 [IU] via SUBCUTANEOUS
  Administered 2016-05-22 (×2): 15 [IU] via SUBCUTANEOUS
  Administered 2016-05-22: 5 [IU] via SUBCUTANEOUS
  Administered 2016-05-23: 8 [IU] via SUBCUTANEOUS

## 2016-05-21 MED ORDER — INSULIN ASPART 100 UNIT/ML ~~LOC~~ SOLN
5.0000 [IU] | Freq: Three times a day (TID) | SUBCUTANEOUS | Status: DC
  Administered 2016-05-21 – 2016-05-22 (×4): 5 [IU] via SUBCUTANEOUS

## 2016-05-21 MED ORDER — INSULIN ASPART 100 UNIT/ML ~~LOC~~ SOLN
0.0000 [IU] | Freq: Every day | SUBCUTANEOUS | Status: DC
  Administered 2016-05-22: 2 [IU] via SUBCUTANEOUS

## 2016-05-21 NOTE — Plan of Care (Signed)
Problem: Activity: Goal: Risk for activity intolerance will decrease Outcome: Progressing Patient ambulated in room with cane.  No complaints of shortness of breath or weakness.  Pt tolerated ambulation well.  No s/s of exertional dyspnea or fatigue noted.  Patient progressing towards goal.

## 2016-05-21 NOTE — Progress Notes (Signed)
PROGRESS NOTE    Caitlin Morris  KPT:[id number] DOB: 1950-08-14 DOA: 05/20/2016 PCP: Benito Mccreedy, MD    Brief Narrative:  66 y.o. female with medical history significant of depression with anxiety, also arthritis, cataracts, glaucoma depression, type 2 diabetes, hyperlipidemia, hypertension, obesity, history of stroke with residual left-sided weakness who is brought emergency department via EMS after having a fall three days ago with complaints of left forearm pain and tremor with motion of left upper extremity.  Incidentally, the patient was found to have a CBG reading high by EMS and a blood glucose measurement of 1127 mg/dL on chemistry labs.  Apparently she has been off her insulin for several days and has not taken her oral hypoglycemic in about three weeks or so.  She has been having blurred vision, polyuria and polydipsia, but is unable to clarify since when.  She denies chest pain, palpitations, dizziness, dyspnea, abdominal pain, nausea, emesis, diarrhea, constipation, melena or hematochezia. She denies hematuria, dysuria or frequency.   Assessment & Plan:   Principal Problem:   Hyperosmolar non-ketotic state in patient with type 2 diabetes mellitus ([place]) Active Problems:   Polyneuropathy due to type 2 diabetes mellitus ([place])   Essential hypertension   Depression with anxiety   Hyperlipidemia   Stroke ([place])   Arthritis   AKI (acute kidney injury) ([place])   Glaucoma  Principal Problem:   Hyperosmolar non-ketotic state in patient with type 2 diabetes mellitus ([place]) Patient was continued with IV hydration. Improved with insulin gtt, since transitioned to subq insulin Will repeat bmet in AM  Active Problems:   AKI (acute kidney injury) ([place]) Continue IV hydration. Hold ACE inhibitor and diuretic. Monitor intake and output. Follow-up BUN, creatinine and electrolytes tomorrow.    Polyneuropathy due to type 2 diabetes mellitus ([facility]) Continue on gabapentin 600 mg by  mouth twice a day.    Essential hypertension Continue amlodipine 10 mg by mouth daily. Held lisinopril/hydrochlorothiazide at time of admission    Depression with anxiety Will continue sertraline 100 mg by mouth at bedtime. Will continue clonazepam 1 mg by mouth 3 times a day when necessary.    Hyperlipidemia Have resumed pravastatin 80 mg by mouth daily tomorrow.    Stroke [facility]) Patient normally walks with a cane. Continue with supportive care.    Arthritis Will continue with analgesics as needed.  DVT prophylaxis: Lovenox Code Status: Full Family Communication: Pt in room, family not at bedside Disposition Plan: Uncertain at this time  Consultants:     Procedures:     Antimicrobials: Anti-infectives    None       Subjective: Feels better today  Objective: Vitals:   05/21/16 0800 05/21/16 1200 05/21/16 1230 05/21/16 1415  BP:   105/63 (!) 100/52  Pulse:   62 (!) 51  Resp:   18 19  Temp: 98.6 F (37 C) 98 F (36.7 C)    TempSrc: Oral Oral    SpO2:   96% 98%  Weight:      Height:        Intake/Output Summary (Last 24 hours) at 05/21/16 1547 Last data filed at 05/21/16 1500  Gross per 24 hour  Intake          2220.21 ml  Output                0 ml  Net          2220.21 ml   Filed Weights   05/20/16 1528 05/20/16 2019 05/21/16 0550  Weight: 82.6 kg (182 lb) 82.6 kg (182 lb) 82.6 kg (182 lb 1.6 oz)    Examination:  General exam: Appears calm and comfortable  Respiratory system: Clear to auscultation. Respiratory effort normal. Cardiovascular system: S1 & S2 heard, RRR.  Gastrointestinal system: Abdomen is nondistended, soft and nontender. No organomegaly or masses felt. Normal bowel sounds heard. Central nervous system: Alert and oriented. No focal neurological deficits. Extremities: Symmetric 5 x 5 power. Skin: No rashes, lesions Psychiatry: Judgement and insight appear normal. Mood & affect appropriate.   Data Reviewed: I have  personally reviewed following labs and imaging studies  CBC:  Recent Labs Lab 05/20/16 1727 05/21/16 0514  WBC 5.7 6.0  NEUTROABS 4.5 3.5  HGB 10.5* 12.3  HCT 29.4* 36.2  MCV 76.6* 78.0  PLT 251 454   Basic Metabolic Panel:  Recent Labs Lab 05/20/16 1540 05/20/16 2105 05/21/16 0155 05/21/16 0514  NA 123* 137 136 137  K 4.0 3.1* 3.8 3.3*  CL 80* 98* 100* 101  CO2 23 26 26 25   GLUCOSE 1,127* 515* 234* 159*  BUN 37* 31* 30* 30*  CREATININE 1.85* 1.61* 1.54* 1.53*  CALCIUM 9.7 9.7 9.1 9.0  MG  --  2.7*  --   --   PHOS  --  1.6*  --  2.8   GFR: Estimated Creatinine Clearance: 35.5 mL/min (A) (by C-G formula based on SCr of 1.53 mg/dL (H)). Liver Function Tests:  Recent Labs Lab 05/21/16 0514  AST 20  ALT 13*  ALKPHOS 108  BILITOT 0.7  PROT 6.7  ALBUMIN 3.4*   No results for input(s): LIPASE, AMYLASE in the last 168 hours. No results for input(s): AMMONIA in the last 168 hours. Coagulation Profile: No results for input(s): INR, PROTIME in the last 168 hours. Cardiac Enzymes: No results for input(s): CKTOTAL, CKMB, CKMBINDEX, TROPONINI in the last 168 hours. BNP (last 3 results) No results for input(s): PROBNP in the last 8760 hours. HbA1C: No results for input(s): HGBA1C in the last 72 hours. CBG:  Recent Labs Lab 05/21/16 0909 05/21/16 1015 05/21/16 1119 05/21/16 1205 05/21/16 1231  GLUCAP 154* 154* 159* 162* 145*   Lipid Profile: No results for input(s): CHOL, HDL, LDLCALC, TRIG, CHOLHDL, LDLDIRECT in the last 72 hours. Thyroid Function Tests: No results for input(s): TSH, T4TOTAL, FREET4, T3FREE, THYROIDAB in the last 72 hours. Anemia Panel: No results for input(s): VITAMINB12, FOLATE, FERRITIN, TIBC, IRON, RETICCTPCT in the last 72 hours. Sepsis Labs: No results for input(s): PROCALCITON, LATICACIDVEN in the last 168 hours.  Recent Results (from the past 240 hour(s))  MRSA PCR Screening     Status: None   Collection Time: 05/20/16  9:47 PM   Result Value Ref Range Status   MRSA by PCR NEGATIVE NEGATIVE Final    Comment:        The GeneXpert MRSA Assay (FDA approved for NASAL specimens only), is one component of a comprehensive MRSA colonization surveillance program. It is not intended to diagnose MRSA infection nor to guide or monitor treatment for MRSA infections.      Radiology Studies: Dg Shoulder Left  Result Date: 05/20/2016 CLINICAL DATA:  Fall 3 days ago, left shoulder pain EXAM: LEFT SHOULDER - 2+ VIEW COMPARISON:  None. FINDINGS: No fracture or dislocation is seen. The joint spaces are preserved. Visualized soft tissues are within normal limits. Visualized left lung is clear. IMPRESSION: Negative. Electronically Signed   By: Julian Hy M.D.   On: 05/20/2016 17:03  Scheduled Meds: . allopurinol  100 mg Oral BID  . amLODipine  10 mg Oral Daily  . brimonidine  1 drop Both Eyes BID   And  . timolol  1 drop Both Eyes BID  . enoxaparin (LOVENOX) injection  40 mg Subcutaneous Q24H  . gabapentin  600 mg Oral BID  . insulin aspart  0-15 Units Subcutaneous TID WC  . insulin aspart  0-5 Units Subcutaneous QHS  . insulin aspart  5 Units Subcutaneous TID WC  . insulin glargine  30 Units Subcutaneous Daily  . mouth rinse  15 mL Mouth Rinse BID  . pravastatin  80 mg Oral QHS  . sertraline  100 mg Oral QHS   Continuous Infusions: . insulin (NOVOLIN-R) infusion Stopped (05/21/16 1232)     LOS: 1 day   Kimia Finan, Orpah Melter, MD [facility] Pager [phone]  If 7PM-7AM, please contact night-coverage [email or web address] Password [facility] 05/21/2016, 3:47 PM

## 2016-05-22 DIAGNOSIS — R739 Hyperglycemia, unspecified: Secondary | ICD-10-CM | POA: Diagnosis not present

## 2016-05-22 DIAGNOSIS — M25512 Pain in left shoulder: Secondary | ICD-10-CM | POA: Diagnosis not present

## 2016-05-22 DIAGNOSIS — E1100 Type 2 diabetes mellitus with hyperosmolarity without nonketotic hyperglycemic-hyperosmolar coma (NKHHC): Secondary | ICD-10-CM | POA: Diagnosis not present

## 2016-05-22 DIAGNOSIS — F418 Other specified anxiety disorders: Secondary | ICD-10-CM | POA: Diagnosis not present

## 2016-05-22 DIAGNOSIS — N179 Acute kidney failure, unspecified: Secondary | ICD-10-CM | POA: Diagnosis not present

## 2016-05-22 DIAGNOSIS — E1101 Type 2 diabetes mellitus with hyperosmolarity with coma: Secondary | ICD-10-CM | POA: Diagnosis not present

## 2016-05-22 LAB — BASIC METABOLIC PANEL
Anion gap: 7 (ref 5–15)
BUN: 35 mg/dL — AB (ref 6–20)
CALCIUM: 8.9 mg/dL (ref 8.9–10.3)
CO2: 25 mmol/L (ref 22–32)
CREATININE: 2.05 mg/dL — AB (ref 0.44–1.00)
Chloride: 99 mmol/L — ABNORMAL LOW (ref 101–111)
GFR calc Af Amer: 28 mL/min — ABNORMAL LOW (ref >60)
GFR calc non Af Amer: 24 mL/min — ABNORMAL LOW (ref >60)
GLUCOSE: 408 mg/dL — AB (ref 65–99)
Potassium: 4.1 mmol/L (ref 3.5–5.1)
Sodium: 131 mmol/L — ABNORMAL LOW (ref 135–145)

## 2016-05-22 LAB — GLUCOSE, CAPILLARY
GLUCOSE-CAPILLARY: 218 mg/dL — AB (ref 65–99)
Glucose-Capillary: 377 mg/dL — ABNORMAL HIGH (ref 65–99)
Glucose-Capillary: 357 mg/dL — ABNORMAL HIGH (ref 65–99)
Glucose-Capillary: 236 mg/dL — ABNORMAL HIGH (ref 65–99)

## 2016-05-22 MED ORDER — INSULIN ASPART 100 UNIT/ML ~~LOC~~ SOLN
7.0000 [IU] | Freq: Three times a day (TID) | SUBCUTANEOUS | Status: DC
Start: 2016-05-22 — End: 2016-05-23
  Administered 2016-05-22: 7 [IU] via SUBCUTANEOUS

## 2016-05-22 MED ORDER — INSULIN GLARGINE 100 UNIT/ML ~~LOC~~ SOLN
8.0000 [IU] | SUBCUTANEOUS | Status: AC
  Administered 2016-05-22: 8 [IU] via SUBCUTANEOUS
  Filled 2016-05-22: qty 0.08

## 2016-05-22 MED ORDER — SODIUM CHLORIDE 0.9 % IV SOLN
INTRAVENOUS | Status: DC
  Administered 2016-05-22 (×2): via INTRAVENOUS

## 2016-05-22 MED ORDER — INSULIN GLARGINE 100 UNIT/ML ~~LOC~~ SOLN
38.0000 [IU] | Freq: Every day | SUBCUTANEOUS | Status: DC
  Filled 2016-05-22: qty 0.38

## 2016-05-22 MED ORDER — ENOXAPARIN SODIUM 30 MG/0.3ML ~~LOC~~ SOLN
30.0000 mg | SUBCUTANEOUS | Status: DC
  Administered 2016-05-22: 30 mg via SUBCUTANEOUS
  Filled 2016-05-22: qty 0.3

## 2016-05-22 NOTE — Progress Notes (Signed)
PROGRESS NOTE    Caitlin Morris  RDE:[id number] DOB: 1950/04/17 DOA: 05/20/2016 PCP: Benito Mccreedy, MD    Brief Narrative:  66 y.o. female with medical history significant of depression with anxiety, also arthritis, cataracts, glaucoma depression, type 2 diabetes, hyperlipidemia, hypertension, obesity, history of stroke with residual left-sided weakness who is brought emergency department via EMS after having a fall three days ago with complaints of left forearm pain and tremor with motion of left upper extremity.  Incidentally, the patient was found to have a CBG reading high by EMS and a blood glucose measurement of 1127 mg/dL on chemistry labs.  Apparently she has been off her insulin for several days and has not taken her oral hypoglycemic in about three weeks or so.  She has been having blurred vision, polyuria and polydipsia, but is unable to clarify since when.  She denies chest pain, palpitations, dizziness, dyspnea, abdominal pain, nausea, emesis, diarrhea, constipation, melena or hematochezia. She denies hematuria, dysuria or frequency.   Assessment & Plan:   Principal Problem:   Hyperosmolar non-ketotic state in patient with type 2 diabetes mellitus ([place]) Active Problems:   Polyneuropathy due to type 2 diabetes mellitus ([place])   Essential hypertension   Depression with anxiety   Hyperlipidemia   Stroke ([place])   Arthritis   AKI (acute kidney injury) ([place])   Glaucoma  Principal Problem:   Hyperosmolar non-ketotic state in patient with type 2 diabetes mellitus ([place]) Patient was continued with IV hydration. Improved with insulin gtt, since transitioned to subq insulin Glucose in the 300's. Will increase lantus to 38 units and increase meal coverage to 7 units  Active Problems:   AKI (acute kidney injury) ([place]) Hold ACE inhibitor and diuretic. Cr has worsened overnight Will resume IVF hydration Repeat BMET in AM    Polyneuropathy due to type 2 diabetes mellitus  ([place]) Continue on gabapentin 600 mg by mouth twice a day. -Stable at present    Essential hypertension Continue amlodipine 10 mg by mouth daily. Held lisinopril/hydrochlorothiazide at time of admission BP currently stable    Depression with anxiety Will continue sertraline 100 mg by mouth at bedtime. Will continue clonazepam 1 mg by mouth 3 times a day when necessary Currently stable.    Hyperlipidemia Have resumed pravastatin 80 mg by mouth daily tomorrow. Stable at present    Stroke [facility]) Patient normally walks with a cane. Continue with supportive care. Stable at present    Arthritis Will continue with analgesics as needed. Stable  DVT prophylaxis: Lovenox Code Status: Full Family Communication: Pt in room, family at bedside Disposition Plan: Uncertain at this time  Consultants:     Procedures:     Antimicrobials: Anti-infectives    None      Subjective: No complaints  Objective: Vitals:   05/21/16 1415 05/21/16 1638 05/21/16 2057 05/22/16 0535  BP: (!) 100/52 124/72 139/72 (!) 129/98  Pulse: (!) 51 (!) 51 (!) 58 (!) 58  Resp: 19 18 18 18   Temp:  98.2 F (36.8 C) 98.7 F (37.1 C) 98.1 F (36.7 C)  TempSrc:  Oral Oral Oral  SpO2: 98% 98% 99% 98%  Weight:  80.1 kg (176 lb 9.4 oz)    Height:  5\' 2"  (1.575 m)      Intake/Output Summary (Last 24 hours) at 05/22/16 1447 Last data filed at 05/22/16 0918  Gross per 24 hour  Intake              977 ml  Output  552 ml  Net              425 ml   Filed Weights   05/20/16 2019 05/21/16 0550 05/21/16 1638  Weight: 82.6 kg (182 lb) 82.6 kg (182 lb 1.6 oz) 80.1 kg (176 lb 9.4 oz)    Examination:  General exam: awake, laying in bed, in nad Respiratory system: normal resp effort, no audible wheezing Cardiovascular system: regular rate, s1, s2  Gastrointestinal system: soft, nondistended, pos BS Central nervous system: cn2-12 grossly intact, strength intact Extremities: perfused, no  clubbing Skin: normal skin turgor, no notable skin lesions seen Psychiatry: mood normal// no visual hallucinations   Data Reviewed: I have personally reviewed following labs and imaging studies  CBC:  Recent Labs Lab 05/20/16 1727 05/21/16 0514  WBC 5.7 6.0  NEUTROABS 4.5 3.5  HGB 10.5* 12.3  HCT 29.4* 36.2  MCV 76.6* 78.0  PLT 251 332   Basic Metabolic Panel:  Recent Labs Lab 05/20/16 1540 05/20/16 2105 05/21/16 0155 05/21/16 0514 05/22/16 0626  NA 123* 137 136 137 131*  K 4.0 3.1* 3.8 3.3* 4.1  CL 80* 98* 100* 101 99*  CO2 23 26 26 25 25   GLUCOSE 1,127* 515* 234* 159* 408*  BUN 37* 31* 30* 30* 35*  CREATININE 1.85* 1.61* 1.54* 1.53* 2.05*  CALCIUM 9.7 9.7 9.1 9.0 8.9  MG  --  2.7*  --   --   --   PHOS  --  1.6*  --  2.8  --    GFR: Estimated Creatinine Clearance: 26.1 mL/min (A) (by C-G formula based on SCr of 2.05 mg/dL (H)). Liver Function Tests:  Recent Labs Lab 05/21/16 0514  AST 20  ALT 13*  ALKPHOS 108  BILITOT 0.7  PROT 6.7  ALBUMIN 3.4*   No results for input(s): LIPASE, AMYLASE in the last 168 hours. No results for input(s): AMMONIA in the last 168 hours. Coagulation Profile: No results for input(s): INR, PROTIME in the last 168 hours. Cardiac Enzymes: No results for input(s): CKTOTAL, CKMB, CKMBINDEX, TROPONINI in the last 168 hours. BNP (last 3 results) No results for input(s): PROBNP in the last 8760 hours. HbA1C: No results for input(s): HGBA1C in the last 72 hours. CBG:  Recent Labs Lab 05/21/16 1231 05/21/16 1709 05/21/16 2105 05/22/16 0734 05/22/16 1220  GLUCAP 145* 206* 197* 377* 357*   Lipid Profile: No results for input(s): CHOL, HDL, LDLCALC, TRIG, CHOLHDL, LDLDIRECT in the last 72 hours. Thyroid Function Tests: No results for input(s): TSH, T4TOTAL, FREET4, T3FREE, THYROIDAB in the last 72 hours. Anemia Panel: No results for input(s): VITAMINB12, FOLATE, FERRITIN, TIBC, IRON, RETICCTPCT in the last 72 hours. Sepsis  Labs: No results for input(s): PROCALCITON, LATICACIDVEN in the last 168 hours.  Recent Results (from the past 240 hour(s))  MRSA PCR Screening     Status: None   Collection Time: 05/20/16  9:47 PM  Result Value Ref Range Status   MRSA by PCR NEGATIVE NEGATIVE Final    Comment:        The GeneXpert MRSA Assay (FDA approved for NASAL specimens only), is one component of a comprehensive MRSA colonization surveillance program. It is not intended to diagnose MRSA infection nor to guide or monitor treatment for MRSA infections.      Radiology Studies: Dg Shoulder Left  Result Date: 05/20/2016 CLINICAL DATA:  Fall 3 days ago, left shoulder pain EXAM: LEFT SHOULDER - 2+ VIEW COMPARISON:  None. FINDINGS: No fracture or dislocation is seen.  The joint spaces are preserved. Visualized soft tissues are within normal limits. Visualized left lung is clear. IMPRESSION: Negative. Electronically Signed   By: Julian Hy M.D.   On: 05/20/2016 17:03    Scheduled Meds: . allopurinol  100 mg Oral BID  . amLODipine  10 mg Oral Daily  . brimonidine  1 drop Both Eyes BID   And  . timolol  1 drop Both Eyes BID  . enoxaparin (LOVENOX) injection  30 mg Subcutaneous Q24H  . gabapentin  600 mg Oral BID  . insulin aspart  0-15 Units Subcutaneous TID WC  . insulin aspart  0-5 Units Subcutaneous QHS  . insulin aspart  7 Units Subcutaneous TID WC  . [START ON 05/23/2016] insulin glargine  38 Units Subcutaneous Daily  . insulin glargine  8 Units Subcutaneous NOW  . mouth rinse  15 mL Mouth Rinse BID  . pravastatin  80 mg Oral QHS  . sertraline  100 mg Oral QHS   Continuous Infusions: . sodium chloride 75 mL/hr at 05/22/16 0943     LOS: 2 days   Gaurav Baldree, Orpah Melter, MD [facility] Pager [phone]  If 7PM-7AM, please contact night-coverage [email or web address] Password [facility] 05/22/2016, 2:47 PM

## 2016-05-22 NOTE — Progress Notes (Signed)
Pt was lying in bed awake and alert when I arrived. Her son was bedside. Pt requested prayer for her health; her grandson joined [place]. She was very grateful for prayer and visit. Please page if additional spt is needed. Chaplain Ernest Haber, M.Div.   05/22/16 2000  Clinical Encounter Type  Visited With Patient

## 2016-05-23 DIAGNOSIS — N179 Acute kidney failure, unspecified: Secondary | ICD-10-CM | POA: Diagnosis not present

## 2016-05-23 DIAGNOSIS — M1990 Unspecified osteoarthritis, unspecified site: Secondary | ICD-10-CM | POA: Diagnosis not present

## 2016-05-23 DIAGNOSIS — R739 Hyperglycemia, unspecified: Secondary | ICD-10-CM | POA: Diagnosis not present

## 2016-05-23 DIAGNOSIS — F418 Other specified anxiety disorders: Secondary | ICD-10-CM | POA: Diagnosis not present

## 2016-05-23 DIAGNOSIS — E1100 Type 2 diabetes mellitus with hyperosmolarity without nonketotic hyperglycemic-hyperosmolar coma (NKHHC): Secondary | ICD-10-CM | POA: Diagnosis not present

## 2016-05-23 DIAGNOSIS — E1101 Type 2 diabetes mellitus with hyperosmolarity with coma: Secondary | ICD-10-CM | POA: Diagnosis not present

## 2016-05-23 LAB — GLUCOSE, CAPILLARY
GLUCOSE-CAPILLARY: 547 mg/dL — AB (ref 65–99)
GLUCOSE-CAPILLARY: 305 mg/dL — AB (ref 65–99)
Glucose-Capillary: 255 mg/dL — ABNORMAL HIGH (ref 65–99)

## 2016-05-23 LAB — BASIC METABOLIC PANEL
ANION GAP: 8 (ref 5–15)
BUN: 28 mg/dL — ABNORMAL HIGH (ref 6–20)
CALCIUM: 8.7 mg/dL — AB (ref 8.9–10.3)
CHLORIDE: 108 mmol/L (ref 101–111)
CO2: 23 mmol/L (ref 22–32)
Creatinine, Ser: 1.37 mg/dL — ABNORMAL HIGH (ref 0.44–1.00)
GFR calc non Af Amer: 39 mL/min — ABNORMAL LOW (ref >60)
GFR, EST AFRICAN AMERICAN: 45 mL/min — AB (ref >60)
GLUCOSE: 264 mg/dL — AB (ref 65–99)
Potassium: 3.7 mmol/L (ref 3.5–5.1)
Sodium: 139 mmol/L (ref 135–145)

## 2016-05-23 MED ORDER — INSULIN ASPART 100 UNIT/ML ~~LOC~~ SOLN
8.0000 [IU] | Freq: Three times a day (TID) | SUBCUTANEOUS | Status: DC
  Administered 2016-05-23: 8 [IU] via SUBCUTANEOUS

## 2016-05-23 MED ORDER — LIVING WELL WITH DIABETES BOOK
Freq: Once | Status: DC
  Filled 2016-05-23: qty 1

## 2016-05-23 MED ORDER — INSULIN GLARGINE 100 UNIT/ML ~~LOC~~ SOLN
42.0000 [IU] | Freq: Every day | SUBCUTANEOUS | Status: DC
  Administered 2016-05-23: 42 [IU] via SUBCUTANEOUS
  Filled 2016-05-23: qty 0.42

## 2016-05-23 MED ORDER — INSULIN PEN NEEDLE 29G X 10MM MISC: 1.0000 "application " | Freq: Every day | 0 refills | Status: AC

## 2016-05-23 MED ORDER — INSULIN GLARGINE 100 UNITS/ML SOLOSTAR PEN: 42.0000 [IU] | PEN_INJECTOR | Freq: Every day | SUBCUTANEOUS | 0 refills | Status: DC

## 2016-05-23 MED ORDER — INSULIN ASPART 100 UNIT/ML FLEXPEN: 8.0000 [IU] | PEN_INJECTOR | Freq: Three times a day (TID) | SUBCUTANEOUS | 0 refills | Status: DC

## 2016-05-23 NOTE — Progress Notes (Signed)
Pt discharge instructions reviewed with the pt and family members. RN advised the pt about how to obtain new PCP and about how to obtain prescriptions. Currently awaiting on ride. No questions or concerns at this time.  Oddie Kuhlmann W Clarene Curran, RN

## 2016-05-23 NOTE — Care Management Note (Signed)
Case Management Note  Patient Details  Name: Caitlin Morris MRN: [id number] Date of Birth: 01-23-1951  Subjective/Objective:       dcd to home admitted due to dka             Action/Plan: Date:  May 23, 2016 Chart reviewed for concurrent status and case management needs. Will continue to follow patient progress. Discharge Planning: following for needs Expected discharge date: [id number] Velva Harman, BSN, [place], [place]  Expected Discharge Date:  05/23/16               Expected Discharge Plan:  Home/Self Care  In-House Referral:     Discharge planning Services     Post Acute Care Choice:    Choice offered to:     DME Arranged:    DME Agency:     HH Arranged:    HH Agency:     Status of Service:  Completed, signed off  If discussed at [place] of Stay Meetings, dates discussed:    Additional Comments:  Leeroy Cha, RN 05/23/2016, 10:37 AM

## 2016-05-23 NOTE — Progress Notes (Signed)
Inpatient Diabetes Program Recommendations  AACE/ADA: New Consensus Statement on Inpatient Glycemic Control (2015)  Target Ranges:  Prepandial:   less than 140 mg/dL      Peak postprandial:   less than 180 mg/dL (1-2 hours)      Critically ill patients:  140 - 180 mg/dL   Spoke with patient about DM management. Patient reports being placed on Lantus after her thyroid surgery over 1 year ago. She reports her Nephew gave her shots every evening consistently. She was prescribed Lantus by a surgeon she said and they would not refill her Lantus, she reports they told her that her PCP needed to refill it. Patient had the insulin pen prescribed.  Patient will need increased dose of Lantus at time of discharge. Spoke with patient about her current inpatient dosing. Patient reports being so many medications at home. Patient verified also being on Amaryl 4 mg Daily and Glipizide 5 mg Daily.  Patient reports that her daughter is looking for a new PCP for her to try to get her "off of some of her meds."  Thanks,  Tama Headings RN, MSN, [facility] Inpatient Diabetes Coordinator Team Pager [phone] (8a-5p)

## 2016-05-23 NOTE — Discharge Summary (Signed)
Physician Discharge Summary  Caitlin Morris FYB:[id number] DOB: January 26, 1950 DOA: 05/20/2016  PCP: Benito Mccreedy, MD  Admit date: 05/20/2016 Discharge date: 05/23/2016  Admitted From: Home Disposition:  Home  Recommendations for Outpatient Follow-up:  1. Follow up with PCP in 1 week 2. Please titrate insulin to ensure euglycemia 3. Recommend repeat bmet in 1 week 4. Please note that BP lisinopril and hctz were held at discharge secondary to worsening renal function this admission. Consider resuming ACEI when/if renal function stabilizes  Discharge Condition:Improved CODE STATUS:Full Diet recommendation: Diabetic   Brief/Interim Summary: 66 y.o.femalewith medical history significant of depression with anxiety, also arthritis, cataracts, glaucoma depression, type 2 diabetes, hyperlipidemia, hypertension, obesity, history of stroke with residual left-sided weakness who is brought emergency department via EMS after having a fall three days ago with complaints of left forearm pain and tremor with motion of left upper extremity. Incidentally, the patient was found to have a CBG reading high by EMS and a blood glucose measurement of 1127 mg/dL on chemistry labs.  Apparently she has been off her insulin for several days and has not taken her oral hypoglycemic in about three weeks or so. She has been having blurred vision, polyuria and polydipsia, but is unable to clarify since when. She denies chest pain, palpitations, dizziness, dyspnea, abdominal pain, nausea, emesis, diarrhea, constipation, melena or hematochezia. She denies hematuria, dysuria orfrequency.  Principal Problem: Hyperosmolar non-ketotic state in patient with type 2 diabetes mellitus ([place]) - Patient was continued with IV hydration. - Improved with insulin gtt, since transitioned to subq insulin -Lantus increased to 42 units with meal coverage increased to 8 units at time of discharge  Active Problems: AKI (acute  kidney injury) ([place]) - Hold ACE inhibitor and diuretic. - Cr has worsened overnight - Improved with IVF hydration overnight  Polyneuropathy due to type 2 diabetes mellitus ([place]) - Continued on gabapentin 600 mg by mouth twice a day. - Stable at present  Essential hypertension - Continue amlodipine 10 mg by mouth daily. - Held lisinopril/hydrochlorothiazide at time of admission - BP remained stable  Depression with anxiety - Continued sertraline 100 mg by mouth at bedtime. - Patient continued clonazepam 1 mg by mouth 3 times as needed  Hyperlipidemia - Have resumed pravastatin 80 mg by mouth - Remained stable at present  Stroke [facility]) - Patient normally walks with a cane. - Continue with supportive care. - Remained stable  Arthritis - Continued with analgesics as needed.  Discharge Diagnoses:  Principal Problem:   Hyperosmolar non-ketotic state in patient with type 2 diabetes mellitus ([place]) Active Problems:   Polyneuropathy due to type 2 diabetes mellitus ([place])   Essential hypertension   Depression with anxiety   Hyperlipidemia   Stroke [facility])   Arthritis   AKI (acute kidney injury) ([place])   Glaucoma    Discharge Instructions   Allergies as of 05/23/2016      Reactions   Codeine Hives, Swelling      Medication List    STOP taking these medications   glimepiride 4 MG tablet Commonly known as:  AMARYL   glipiZIDE 5 MG 24 hr tablet Commonly known as:  GLUCOTROL XL   lisinopril-hydrochlorothiazide 20-25 MG tablet Commonly known as:  PRINZIDE,ZESTORETIC     TAKE these medications   accu-chek soft touch lancets Use as instructed   allopurinol 100 MG tablet Commonly known as:  ZYLOPRIM Take 100 mg by mouth 2 (two) times daily.   amLODipine 10 MG tablet Commonly known as:  NORVASC  Take 10 mg by mouth daily.   clonazePAM 1 MG tablet Commonly known as:  KLONOPIN Take 1 mg by mouth at bedtime.   COMBIGAN 0.2-0.5 % ophthalmic  solution Generic drug:  brimonidine-timolol Place 1 drop into both eyes every 12 (twelve) hours.   gabapentin 600 MG tablet Commonly known as:  NEURONTIN Take 600 mg by mouth 2 (two) times daily.   glucose blood test strip Commonly known as:  ACCU-CHEK ACTIVE STRIPS Use as instructed   glucose monitoring kit monitoring kit 1 each by Does not apply route 4 (four) times daily - after meals and at bedtime. 1 month Diabetic Testing Supplies for QAC-QHS accuchecks.   insulin aspart 100 UNIT/ML FlexPen Commonly known as:  NOVOLOG Inject 8 Units into the skin 3 (three) times daily with meals.   insulin glargine 100 unit/mL Sopn Commonly known as:  LANTUS Inject 0.42 mLs (42 Units total) into the skin daily. What changed:  how much to take  when to take this   Insulin Pen Needle 29G X 03TD Misc 1 application by Does not apply route at bedtime.   pravastatin 80 MG tablet Commonly known as:  PRAVACHOL Take 80 mg by mouth at bedtime.   sertraline 100 MG tablet Commonly known as:  ZOLOFT Take 100 mg by mouth at bedtime.   tetrahydrozoline-zinc 0.05-0.25 % ophthalmic solution Commonly known as:  VISINE-AC Place 2 drops into both eyes 3 (three) times daily as needed (DRY EYES).      Follow-up Information    OSEI-BONSU,GEORGE, MD. Schedule an appointment as soon as possible for a visit in 1 week(s).   Specialty:  Internal Medicine Contact information: [address] [place] [address] [phone]          Allergies  Allergen Reactions  . Codeine Hives and Swelling     Procedures/Studies: Dg Shoulder Left  Result Date: 05/20/2016 CLINICAL DATA:  Fall 3 days ago, left shoulder pain EXAM: LEFT SHOULDER - 2+ VIEW COMPARISON:  None. FINDINGS: No fracture or dislocation is seen. The joint spaces are preserved. Visualized soft tissues are within normal limits. Visualized left lung is clear. IMPRESSION: Negative. Electronically Signed   By: Julian Hy M.D.   On: 05/20/2016 17:03   Mm Digital Screening Bilateral  Result Date: 04/29/2016 CLINICAL DATA:  Screening. EXAM: DIGITAL SCREENING BILATERAL MAMMOGRAM WITH CAD COMPARISON:  Previous exam(s). ACR Breast Density Category b: There are scattered areas of fibroglandular density. FINDINGS: There are no findings suspicious for malignancy. Images were processed with CAD. IMPRESSION: No mammographic evidence of malignancy. A result letter of this screening mammogram will be mailed directly to the patient. RECOMMENDATION: Screening mammogram in one year. (Code:SM-B-01Y) BI-RADS CATEGORY  1: Negative. Electronically Signed   By: Trude Mcburney M.D.   On: 04/29/2016 10:07    Subjective: Feels better today. Eager to go homeDictation #1 [id number]  [id number]   Discharge Exam: Vitals:   05/22/16 2041 05/23/16 0439  BP: (!) 154/84 (!) 153/78  Pulse: 65 (!) 54  Resp: 20 20  Temp: 98.3 F (36.8 C) 98 F (36.7 C)   Vitals:   05/22/16 0535 05/22/16 1500 05/22/16 2041 05/23/16 0439  BP: (!) 129/98 134/83 (!) 154/84 (!) 153/78  Pulse: (!) 58 81 65 (!) 54  Resp: 18 18 20 20   Temp: 98.1 F (36.7 C) 98.2 F (36.8 C) 98.3 F (36.8 C) 98 F (36.7 C)  TempSrc: Oral Oral Oral Oral  SpO2: 98% 94% 94% 96%  Weight:      Height:        General: Pt is alert, awake, not in acute distress Cardiovascular: RRR, S1/S2 +, no rubs, no gallops Respiratory: CTA bilaterally, no wheezing, no rhonchi Abdominal: Soft, NT, ND, bowel sounds + Extremities: no edema, no cyanosis   The results of significant diagnostics from this hospitalization (including imaging, microbiology, ancillary and laboratory) are listed below for reference.     Microbiology: Recent Results (from the past 240 hour(s))  MRSA PCR Screening     Status: None   Collection Time: 05/20/16  9:47 PM  Result Value Ref Range Status   MRSA by PCR NEGATIVE NEGATIVE Final    Comment:        The GeneXpert MRSA Assay  (FDA approved for NASAL specimens only), is one component of a comprehensive MRSA colonization surveillance program. It is not intended to diagnose MRSA infection nor to guide or monitor treatment for MRSA infections.      Labs: BNP (last 3 results) No results for input(s): BNP in the last 8760 hours. Basic Metabolic Panel:  Recent Labs Lab 05/20/16 2105 05/21/16 0155 05/21/16 0514 05/22/16 0626 05/23/16 0641  NA 137 136 137 131* 139  K 3.1* 3.8 3.3* 4.1 3.7  CL 98* 100* 101 99* 108  CO2 26 26 25 25 23   GLUCOSE 515* 234* 159* 408* 264*  BUN 31* 30* 30* 35* 28*  CREATININE 1.61* 1.54* 1.53* 2.05* 1.37*  CALCIUM 9.7 9.1 9.0 8.9 8.7*  MG 2.7*  --   --   --   --   PHOS 1.6*  --  2.8  --   --    Liver Function Tests:  Recent Labs Lab 05/21/16 0514  AST 20  ALT 13*  ALKPHOS 108  BILITOT 0.7  PROT 6.7  ALBUMIN 3.4*   No results for input(s): LIPASE, AMYLASE in the last 168 hours. No results for input(s): AMMONIA in the last 168 hours. CBC:  Recent Labs Lab 05/20/16 1727 05/21/16 0514  WBC 5.7 6.0  NEUTROABS 4.5 3.5  HGB 10.5* 12.3  HCT 29.4* 36.2  MCV 76.6* 78.0  PLT 251 260   Cardiac Enzymes: No results for input(s): CKTOTAL, CKMB, CKMBINDEX, TROPONINI in the last 168 hours. BNP: Invalid input(s): POCBNP CBG:  Recent Labs Lab 05/22/16 0734 05/22/16 1220 05/22/16 1727 05/22/16 2044 05/23/16 0748  GLUCAP 377* 357* 236* 218* 255*   D-Dimer No results for input(s): DDIMER in the last 72 hours. Hgb A1c No results for input(s): HGBA1C in the last 72 hours. Lipid Profile No results for input(s): CHOL, HDL, LDLCALC, TRIG, CHOLHDL, LDLDIRECT in the last 72 hours. Thyroid function studies No results for input(s): TSH, T4TOTAL, T3FREE, THYROIDAB in the last 72 hours.  Invalid input(s): FREET3 Anemia work up No results for input(s): VITAMINB12, FOLATE, FERRITIN, TIBC, IRON, RETICCTPCT in the last 72 hours. Urinalysis    Component Value  Date/Time   COLORURINE STRAW (A) 05/20/2016 1639   APPEARANCEUR CLEAR 05/20/2016 1639   LABSPEC 1.023 05/20/2016 1639   PHURINE 5.0 05/20/2016 1639   GLUCOSEU >=500 (A) 05/20/2016 1639   HGBUR SMALL (A) 05/20/2016 1639   HGBUR negative 07/07/2009 0957   BILIRUBINUR NEGATIVE 05/20/2016 1639   KETONESUR 5 (A) 05/20/2016 1639   PROTEINUR 100 (A) 05/20/2016 1639   UROBILINOGEN 1.0 10/01/2013 0915   NITRITE NEGATIVE 05/20/2016 1639   LEUKOCYTESUR NEGATIVE 05/20/2016 1639   Sepsis Labs Invalid input(s): PROCALCITONIN,  WBC,  LACTICIDVEN Microbiology Recent Results (from the  past 240 hour(s))  MRSA PCR Screening     Status: None   Collection Time: 05/20/16  9:47 PM  Result Value Ref Range Status   MRSA by PCR NEGATIVE NEGATIVE Final    Comment:        The GeneXpert MRSA Assay (FDA approved for NASAL specimens only), is one component of a comprehensive MRSA colonization surveillance program. It is not intended to diagnose MRSA infection nor to guide or monitor treatment for MRSA infections.      SIGNED:   Donne Hazel, MD  [facility] 05/23/2016, 5:23 PM  If 7PM-7AM, please contact night-coverage [email or web address] Password [email or web address]

## 2017-03-23 ENCOUNTER — Other Ambulatory Visit: Payer: Self-pay | Admitting: Internal Medicine

## 2017-03-23 DIAGNOSIS — Z1231 Encounter for screening mammogram for malignant neoplasm of breast: Secondary | ICD-10-CM

## 2017-04-24 ENCOUNTER — Emergency Department (HOSPITAL_COMMUNITY): Payer: Medicare Other

## 2017-04-24 ENCOUNTER — Inpatient Hospital Stay (HOSPITAL_COMMUNITY)
Admission: EM | Admit: 2017-04-24 | Discharge: 2017-05-01 | DRG: 100 | Disposition: A | Discharge status: Skilled Nursing Facility | Payer: Medicare Other | Attending: Internal Medicine | Admitting: Internal Medicine

## 2017-04-24 ENCOUNTER — Encounter (HOSPITAL_COMMUNITY): Payer: Self-pay | Admitting: Emergency Medicine

## 2017-04-24 DIAGNOSIS — Z87891 Personal history of nicotine dependence: Secondary | ICD-10-CM

## 2017-04-24 DIAGNOSIS — E1142 Type 2 diabetes mellitus with diabetic polyneuropathy: Secondary | ICD-10-CM | POA: Diagnosis present

## 2017-04-24 DIAGNOSIS — L89302 Pressure ulcer of unspecified buttock, stage 2: Secondary | ICD-10-CM | POA: Diagnosis not present

## 2017-04-24 DIAGNOSIS — Z96652 Presence of left artificial knee joint: Secondary | ICD-10-CM | POA: Diagnosis not present

## 2017-04-24 DIAGNOSIS — E1165 Type 2 diabetes mellitus with hyperglycemia: Secondary | ICD-10-CM | POA: Diagnosis not present

## 2017-04-24 DIAGNOSIS — M109 Gout, unspecified: Secondary | ICD-10-CM | POA: Diagnosis not present

## 2017-04-24 DIAGNOSIS — M19072 Primary osteoarthritis, left ankle and foot: Secondary | ICD-10-CM | POA: Diagnosis present

## 2017-04-24 DIAGNOSIS — R930 Abnormal findings on diagnostic imaging of skull and head, not elsewhere classified: Secondary | ICD-10-CM | POA: Diagnosis present

## 2017-04-24 DIAGNOSIS — E1122 Type 2 diabetes mellitus with diabetic chronic kidney disease: Secondary | ICD-10-CM | POA: Diagnosis not present

## 2017-04-24 DIAGNOSIS — G9341 Metabolic encephalopathy: Secondary | ICD-10-CM | POA: Diagnosis present

## 2017-04-24 DIAGNOSIS — G40901 Epilepsy, unspecified, not intractable, with status epilepticus: Secondary | ICD-10-CM | POA: Diagnosis not present

## 2017-04-24 DIAGNOSIS — H538 Other visual disturbances: Secondary | ICD-10-CM

## 2017-04-24 DIAGNOSIS — I69354 Hemiplegia and hemiparesis following cerebral infarction affecting left non-dominant side: Secondary | ICD-10-CM

## 2017-04-24 DIAGNOSIS — H518 Other specified disorders of binocular movement: Secondary | ICD-10-CM | POA: Diagnosis present

## 2017-04-24 DIAGNOSIS — I495 Sick sinus syndrome: Secondary | ICD-10-CM | POA: Diagnosis not present

## 2017-04-24 DIAGNOSIS — E669 Obesity, unspecified: Secondary | ICD-10-CM | POA: Diagnosis present

## 2017-04-24 DIAGNOSIS — R4182 Altered mental status, unspecified: Secondary | ICD-10-CM | POA: Diagnosis present

## 2017-04-24 DIAGNOSIS — Z794 Long term (current) use of insulin: Secondary | ICD-10-CM

## 2017-04-24 DIAGNOSIS — F329 Major depressive disorder, single episode, unspecified: Secondary | ICD-10-CM | POA: Diagnosis present

## 2017-04-24 DIAGNOSIS — I129 Hypertensive chronic kidney disease with stage 1 through stage 4 chronic kidney disease, or unspecified chronic kidney disease: Secondary | ICD-10-CM | POA: Diagnosis present

## 2017-04-24 DIAGNOSIS — G9340 Encephalopathy, unspecified: Secondary | ICD-10-CM

## 2017-04-24 DIAGNOSIS — I639 Cerebral infarction, unspecified: Secondary | ICD-10-CM | POA: Diagnosis present

## 2017-04-24 DIAGNOSIS — I10 Essential (primary) hypertension: Secondary | ICD-10-CM | POA: Diagnosis not present

## 2017-04-24 DIAGNOSIS — N183 Chronic kidney disease, stage 3 unspecified: Secondary | ICD-10-CM | POA: Diagnosis present

## 2017-04-24 DIAGNOSIS — I6389 Other cerebral infarction: Secondary | ICD-10-CM

## 2017-04-24 DIAGNOSIS — H510 Palsy (spasm) of conjugate gaze: Secondary | ICD-10-CM

## 2017-04-24 DIAGNOSIS — E1140 Type 2 diabetes mellitus with diabetic neuropathy, unspecified: Secondary | ICD-10-CM | POA: Diagnosis not present

## 2017-04-24 DIAGNOSIS — H543 Unqualified visual loss, both eyes: Secondary | ICD-10-CM | POA: Diagnosis not present

## 2017-04-24 DIAGNOSIS — Z6830 Body mass index (BMI) 30.0-30.9, adult: Secondary | ICD-10-CM | POA: Diagnosis not present

## 2017-04-24 DIAGNOSIS — E785 Hyperlipidemia, unspecified: Secondary | ICD-10-CM

## 2017-04-24 DIAGNOSIS — H409 Unspecified glaucoma: Secondary | ICD-10-CM | POA: Diagnosis not present

## 2017-04-24 DIAGNOSIS — E1151 Type 2 diabetes mellitus with diabetic peripheral angiopathy without gangrene: Secondary | ICD-10-CM | POA: Diagnosis not present

## 2017-04-24 DIAGNOSIS — Z833 Family history of diabetes mellitus: Secondary | ICD-10-CM

## 2017-04-24 DIAGNOSIS — Z79899 Other long term (current) drug therapy: Secondary | ICD-10-CM

## 2017-04-24 DIAGNOSIS — IMO0002 Reserved for concepts with insufficient information to code with codable children: Secondary | ICD-10-CM | POA: Diagnosis present

## 2017-04-24 DIAGNOSIS — Z8673 Personal history of transient ischemic attack (TIA), and cerebral infarction without residual deficits: Secondary | ICD-10-CM

## 2017-04-24 DIAGNOSIS — Z885 Allergy status to narcotic agent status: Secondary | ICD-10-CM

## 2017-04-24 DIAGNOSIS — Z9114 Patient's other noncompliance with medication regimen: Secondary | ICD-10-CM | POA: Diagnosis not present

## 2017-04-24 DIAGNOSIS — F419 Anxiety disorder, unspecified: Secondary | ICD-10-CM | POA: Diagnosis present

## 2017-04-24 DIAGNOSIS — H47619 Cortical blindness, unspecified side of brain: Secondary | ICD-10-CM | POA: Diagnosis present

## 2017-04-24 DIAGNOSIS — R569 Unspecified convulsions: Secondary | ICD-10-CM

## 2017-04-24 LAB — URINALYSIS, ROUTINE W REFLEX MICROSCOPIC
Bacteria, UA: NONE SEEN
Bilirubin Urine: NEGATIVE
Ketones, ur: NEGATIVE mg/dL
NITRITE: NEGATIVE
PH: 6 (ref 5.0–8.0)
Protein, ur: >=300 mg/dL — AB
SPECIFIC GRAVITY, URINE: 1.026 (ref 1.005–1.030)

## 2017-04-24 LAB — RAPID URINE DRUG SCREEN, HOSP PERFORMED
Amphetamines: NOT DETECTED
BARBITURATES: NOT DETECTED
Benzodiazepines: NOT DETECTED
COCAINE: NOT DETECTED
Opiates: NOT DETECTED
TETRAHYDROCANNABINOL: NOT DETECTED

## 2017-04-24 LAB — COMPREHENSIVE METABOLIC PANEL
ALT: 14 U/L (ref 14–54)
AST: 22 U/L (ref 15–41)
Albumin: 3.9 g/dL (ref 3.5–5.0)
Alkaline Phosphatase: 131 U/L — ABNORMAL HIGH (ref 38–126)
Anion gap: 15 (ref 5–15)
BILIRUBIN TOTAL: 1.6 mg/dL — AB (ref 0.3–1.2)
BUN: 23 mg/dL — AB (ref 6–20)
CO2: 26 mmol/L (ref 22–32)
CREATININE: 1.42 mg/dL — AB (ref 0.44–1.00)
Calcium: 9.8 mg/dL (ref 8.9–10.3)
Chloride: 93 mmol/L — ABNORMAL LOW (ref 101–111)
GFR, EST AFRICAN AMERICAN: 43 mL/min — AB (ref >60)
GFR, EST NON AFRICAN AMERICAN: 37 mL/min — AB (ref >60)
Glucose, Bld: 390 mg/dL — ABNORMAL HIGH (ref 65–99)
Potassium: 3.6 mmol/L (ref 3.5–5.1)
Sodium: 134 mmol/L — ABNORMAL LOW (ref 135–145)
TOTAL PROTEIN: 8.1 g/dL (ref 6.5–8.1)

## 2017-04-24 LAB — CBC
HEMATOCRIT: 43.4 % (ref 36.0–46.0)
Hemoglobin: 14.5 g/dL (ref 12.0–15.0)
MCH: 27.9 pg (ref 26.0–34.0)
MCHC: 33.4 g/dL (ref 30.0–36.0)
MCV: 83.6 fL (ref 78.0–100.0)
Platelets: 305 10*3/uL (ref 150–400)
RBC: 5.19 MIL/uL — ABNORMAL HIGH (ref 3.87–5.11)
RDW: 14.1 % (ref 11.5–15.5)
WBC: 9.4 10*3/uL (ref 4.0–10.5)

## 2017-04-24 LAB — PROTIME-INR
INR: 1.19
Prothrombin Time: 15 seconds (ref 11.4–15.2)

## 2017-04-24 LAB — LIPASE, BLOOD: Lipase: 33 U/L (ref 11–51)

## 2017-04-24 LAB — APTT: aPTT: 28 seconds (ref 24–36)

## 2017-04-24 LAB — ETHANOL: Alcohol, Ethyl (B): <10 mg/dL (ref <10)

## 2017-04-24 MED ORDER — TETRACAINE HCL 0.5 % OP SOLN
2.0000 [drp] | Freq: Once | OPHTHALMIC | Status: AC
  Administered 2017-04-24: 2 [drp] via OPHTHALMIC
  Filled 2017-04-24: qty 4

## 2017-04-24 MED ORDER — FLUORESCEIN SODIUM 1 MG OP STRP
1.0000 | ORAL_STRIP | Freq: Once | OPHTHALMIC | Status: AC
  Administered 2017-04-24: 1 via OPHTHALMIC
  Filled 2017-04-24: qty 1

## 2017-04-24 NOTE — ED Notes (Signed)
Lab called- CBC clotted, will need to recollect

## 2017-04-24 NOTE — H&P (Signed)
Caitlin Morris BWG:[id number] DOB: April 30, 1950 DOA: 04/24/2017     PCP: Benito Mccreedy, MD   Outpatient Specialists: CARDS:  Dr. Marlou Porch NEphrology:    Dr. Dr. Adriana Mccallum     Patient arrived to ER on 04/24/17 at 1703  Patient coming from:    home Lives  With family her nephew    Chief Complaint:  Chief Complaint  Patient presents with  . Altered Mental Status  . Blurred Vision    HPI: Caitlin Morris is a 67 y.o. female with medical history significant of diabetes, hypertension, hyperlipidemia, sick sinus syndrome history of CVA with residual left-sided weakness, CKD    Presented with nausea vomiting occasional headaches blurred vision for the past 6 days no associated diarrhea. Blurry vision on Thursday 6 days ago, trouble remembering words, family stated her speech seemed to be repetitive.  Confused about where she was located.  Saturday 2 days ago developed gaze to the right.  Note patient has had persistent nausea and vomiting on a daily basis family did not report any associated fevers chills neck stiffness no chest pain shortness of breath or patient has reported no diarrhea. NO fever.   Patient has not been recently compliant with her insulin. Prior hx of CVA with residual left-sided weakness apparently patient has had in April 2018 with blurred vision at that time and was thought to be secondary to uncontrolled diabetes   Regarding pertinent Chronic problems: She has poorly controlled hypertension she is on  amlodipine. Did not tolerate beta-blockers in the past secondary to bradycardia Last echogram done in 2017 showed preserved EF grade 2 diastolic dysfunction History of diabetes on Lantus While in ER:   Significant initial  Findings: Abnormal Labs Reviewed  COMPREHENSIVE METABOLIC PANEL - Abnormal; Notable for the following components:      Result Value   Sodium 134 (*)    Chloride 93 (*)    Glucose, Bld 390 (*)    BUN 23 (*)    Creatinine, Ser 1.42 (*)     Alkaline Phosphatase 131 (*)    Total Bilirubin 1.6 (*)    GFR calc non Af Amer 37 (*)    GFR calc Af Amer 43 (*)    All other components within normal limits  URINALYSIS, ROUTINE W REFLEX MICROSCOPIC - Abnormal; Notable for the following components:   Glucose, UA >=500 (*)    Hgb urine dipstick SMALL (*)    Protein, ur >=300 (*)    Leukocytes, UA SMALL (*)    Squamous Epithelial / LPF 0-5 (*)    All other components within normal limits  CBC - Abnormal; Notable for the following components:   RBC 5.19 (*)    All other components within normal limits     K 3.6  Cr    stable,   Lab Results  Component Value Date   CREATININE 1.42 (H) 04/24/2017   CREATININE 1.37 (H) 05/23/2016   CREATININE 2.05 (H) 05/22/2016     WBC 9.4  HG/HCT stable     Component Value Date/Time   HGB 14.5 04/24/2017 1805   HCT 43.4 04/24/2017 1805        UA no evidence of UTI     CT HEAD - NON acute, old appearing CVAs bilaterally  CXR -  NON acute mild cardiomegaly    ECG:  Personally reviewed by me showing: HR : 57 Rhythm: bradycardia Ischemic changes  no evidence of ischemic changes QTC450    ED  Triage Vitals  Enc Vitals Group     BP 04/24/17 1714 (!) 168/107     Pulse Rate 04/24/17 1714 (!) 56     Resp 04/24/17 1714 20     Temp 04/24/17 1714 98.4 F (36.9 C)     Temp Source 04/24/17 1714 Oral     SpO2 04/24/17 1714 96 %     Weight 04/24/17 1714 185 lb (83.9 kg)     Height 04/24/17 1714 _0  (1.651 m)     Head Circumference --      Peak Flow --      Pain Score 04/24/17 1722 10     Pain Loc --      Pain Edu? --      Excl. in [place]? --   TMAX(24)@     on arrival  ED Triage Vitals  Enc Vitals Group     BP 04/24/17 1714 (!) 168/107     Pulse Rate 04/24/17 1714 (!) 56     Resp 04/24/17 1714 20     Temp 04/24/17 1714 98.4 F (36.9 C)     Temp Source 04/24/17 1714 Oral     SpO2 04/24/17 1714 96 %     Weight 04/24/17 1714 185 lb (83.9 kg)     Height 04/24/17 1714 _1   (1.651 m)     Head Circumference --      Peak Flow --      Pain Score 04/24/17 1722 10     Pain Loc --      Pain Edu? --      Excl. in [place]? --      Latest  Blood pressure (!) 170/99, pulse 64, temperature 98.4 F (36.9 C), temperature source Oral, resp. rate (!) 22, height _2  (1.651 m), weight 83.9 kg (185 lb), SpO2 92 %.   Following Medications were ordered in ER: Medications  fluorescein ophthalmic strip 1 strip (1 strip Both Eyes Given 04/24/17 1912)  tetracaine (PONTOCAINE) 0.5 % ophthalmic solution 2 drop (2 drops Both Eyes Given 04/24/17 1912)    ER Provider Called:     Dr.Arora They Recommend transferred to [facility] Will see in consult on arrival  Hospitalist was called for admission for possible cva   Review of Systems:    Pertinent positives include: Gaze preference slurred speech,   Constitutional:  No weight loss, night sweats, Fevers, chills, fatigue, weight loss  HEENT:  No headaches, Difficulty swallowing,Tooth/dental problems,Sore throat,  No sneezing, itching, ear ache, nasal congestion, post nasal drip,  Cardio-vascular:  No chest pain, Orthopnea, PND, anasarca, dizziness, palpitations.no Bilateral lower extremity swelling  GI:  No heartburn, indigestion, abdominal pain, nausea, vomiting, diarrhea, change in bowel habits, loss of appetite, melena, blood in stool, hematemesis Resp:  no shortness of breath at rest. No dyspnea on exertion, No excess mucus, no productive cough, No non-productive cough, No coughing up of blood.No change in color of mucus.No wheezing. Skin:  no rash or lesions. No jaundice GU:  no dysuria, change in color of urine, no urgency or frequency. No straining to urinate.  No flank pain.  Musculoskeletal:  No joint pain or no joint swelling. No decreased range of motion. No back pain.  Psych:  No change in mood or affect. No depression or anxiety. No memory loss.  Neuro: no localizing neurological complaints, no tingling, no  weakness, no double vision, no gait abnormality, no no confusion  As per HPI otherwise 10 point review of systems negative.  Past Medical History:   Past Medical History:  Diagnosis Date  . Anxiety   . Arthritis    arthritis left foot and gout  . Cataract    not surgical yet  . Depression   . Diabetes mellitus without complication ([place])   . Glaucoma   . Hyperlipidemia   . Hypertension   . Obesity   . Stroke [facility])    left sided weakness remains-ambulates with cane.      Past Surgical History:  Procedure Laterality Date  . JOINT REPLACEMENT     Left TKA  . PARATHYROIDECTOMY Left 12/31/2015   Procedure: LEFT MINIMALLY INVASIVE PARATHYROIDECTOMY;  Surgeon: Armandina Gemma, MD;  Location: [facility];  Service: General;  Laterality: Left;  . TOTAL KNEE ARTHROPLASTY Left 10/01/2013   Procedure: TOTAL KNEE ARTHROPLASTY;  Surgeon: Nita Sells, MD;  Location: [facility];  Service: Orthopedics;  Laterality: Left;  Left total knee arthroplasty  . TUBAL LIGATION      Social History:  Ambulatory  Cane or walker       reports that she quit smoking about 12 years ago. Her smoking use included cigarettes. She has a 30.00 pack-year smoking history. She has never used smokeless tobacco. She reports that she does not drink alcohol or use drugs.     Family History:   Family History  Problem Relation Age of Onset  . Diabetes Sister   . Diabetes Son   . Breast cancer Cousin     Allergies: Allergies  Allergen Reactions  . Codeine Hives and Swelling     Prior to Admission medications   Medication Sig Start Date End Date Taking? Authorizing Provider  allopurinol (ZYLOPRIM) 100 MG tablet Take 100 mg by mouth 2 (two) times daily.  10/22/15  Yes [provider]  amLODipine (NORVASC) 10 MG tablet Take 10 mg by mouth daily.   Yes [provider]  clonazePAM (KLONOPIN) 1 MG tablet Take 1 mg by mouth at bedtime.    Yes [provider]  COMBIGAN 0.2-0.5 %  ophthalmic solution Place 1 drop into both eyes every 12 (twelve) hours.  04/18/16  Yes [provider]  gabapentin (NEURONTIN) 600 MG tablet Take 600 mg by mouth 2 (two) times daily.   Yes [provider]  insulin aspart (NOVOLOG) 100 UNIT/ML FlexPen Inject 8 Units into the skin 3 (three) times daily with meals. 05/23/16  Yes Donne Hazel, MD  LEVEMIR FLEXTOUCH 100 UNIT/ML Pen INJECT 25 UNITS [place] [facility] 04/09/17  Yes [provider]  pravastatin (PRAVACHOL) 80 MG tablet Take 80 mg by mouth at bedtime. 07/10/15  Yes [provider]  sertraline (ZOLOFT) 100 MG tablet Take 100 mg by mouth at bedtime.    Yes [provider]  glucose blood (ACCU-CHEK ACTIVE STRIPS) test strip Use as instructed 11/11/15   Dhungel, Nishant, MD  glucose monitoring kit (FREESTYLE) monitoring kit 1 each by Does not apply route 4 (four) times daily - after meals and at bedtime. 1 month Diabetic Testing Supplies for QAC-QHS accuchecks. 11/11/15   Dhungel, Nishant, MD  insulin glargine (LANTUS) 100 unit/mL SOPN Inject 0.42 mLs (42 Units total) into the skin daily. Patient not taking: Reported on 04/24/2017 05/23/16   Donne Hazel, MD  Insulin Pen Needle 29G X 93XT MISC 1 application by Does not apply route at bedtime. 05/23/16   Donne Hazel, MD  Lancets (ACCU-CHEK SOFT [facility]) lancets Use as instructed 11/11/15   Dhungel, Flonnie Overman, MD  tetrahydrozoline-zinc (VISINE-AC) 0.05-0.25 %  ophthalmic solution Place 2 drops into both eyes 3 (three) times daily as needed (DRY EYES).    [provider]   Physical Exam: Blood pressure (!) 170/99, pulse 64, temperature 98.4 F (36.9 C), temperature source Oral, resp. rate (!) 22, height _0  (1.651 m), weight 83.9 kg (185 lb), SpO2 92 %. 1. General:  in No Acute distress  well   -appearing 2. Psychological: Alert and  Oriented 3. Head/ENT:    Dry Mucous Membranes                          Head Non traumatic, neck supple                             Poor Dentition 4. SKIN:   decreased Skin turgor,  Skin clean Dry and intact no rash 5. Heart: Regular rate and rhythm no Murmur, no Rub or gallop 6. Lungs:  no wheezes or crackles   7. Abdomen: Soft, non-tender, distended   obese  bowel sounds present 8. Lower extremities: no clubbing, cyanosis, or edema 9. Neurologically   strength 5 out of 5 in all 4 extremities right sided gaze preference 10. MSK: Normal range of motion   LABS:     Recent Labs  Lab 04/24/17 1805  WBC 9.4  HGB 14.5  HCT 43.4  MCV 83.6  PLT 400   Basic Metabolic Panel: Recent Labs  Lab 04/24/17 1730  NA 134*  K 3.6  CL 93*  CO2 26  GLUCOSE 390*  BUN 23*  CREATININE 1.42*  CALCIUM 9.8      Recent Labs  Lab 04/24/17 1730  AST 22  ALT 14  ALKPHOS 131*  BILITOT 1.6*  PROT 8.1  ALBUMIN 3.9   Recent Labs  Lab 04/24/17 1730  LIPASE 33   No results for input(s): AMMONIA in the last 168 hours.    HbA1C: No results for input(s): HGBA1C in the last 72 hours. CBG: No results for input(s): GLUCAP in the last 168 hours.    Urine analysis:    Component Value Date/Time   COLORURINE YELLOW 04/24/2017 1759   APPEARANCEUR CLEAR 04/24/2017 1759   LABSPEC 1.026 04/24/2017 1759   PHURINE 6.0 04/24/2017 1759   GLUCOSEU >=500 (A) 04/24/2017 1759   HGBUR SMALL (A) 04/24/2017 1759   HGBUR negative 07/07/2009 0957   BILIRUBINUR NEGATIVE 04/24/2017 1759   KETONESUR NEGATIVE 04/24/2017 1759   PROTEINUR >=300 (A) 04/24/2017 1759   UROBILINOGEN 1.0 10/01/2013 0915   NITRITE NEGATIVE 04/24/2017 1759   LEUKOCYTESUR SMALL (A) 04/24/2017 1759      Cultures: No results found for: SDES, SPECREQUEST, CULT, REPTSTATUS   Radiological Exams on Admission: Dg Chest 2 View  Result Date: 04/24/2017 CLINICAL DATA:  Altered mental status EXAM: CHEST - 2 VIEW COMPARISON:  08/29/2015 FINDINGS: No acute pulmonary infiltrate or effusion. Mild cardiomegaly with aortic atherosclerosis. No pneumothorax.  Degenerative changes of the spine. IMPRESSION: No active cardiopulmonary disease.  Mild cardiomegaly. Electronically Signed   By: Donavan Foil M.D.   On: 04/24/2017 20:34   Ct Head Wo Contrast  Addendum Date: 04/24/2017   ADDENDUM REPORT: 04/24/2017 21:12 ADDENDUM: Correction to impression Impression #2 should read: Small vessel ischemic changes of the white matter with old appearing bilateral lacunar infarcts. Electronically Signed   By: Donavan Foil M.D.   On: 04/24/2017 21:12   Result Date: 04/24/2017 CLINICAL DATA:  Nausea  vomiting headache and altered LOC EXAM: CT HEAD WITHOUT CONTRAST TECHNIQUE: Contiguous axial images were obtained from the base of the skull through the vertex without intravenous contrast. COMPARISON:  None. FINDINGS: Brain: No acute territorial infarction, hemorrhage or intracranial mass is visualized. Moderate small vessel ischemic changes of the white matter. Old appearing lacunar infarcts in the right basal ganglia and left caudate. Probable old left posterior white matter injury with mild asymmetric enlargement of the left atria. Vascular: No hyperdense vessels.  Carotid vascular calcification. Skull: No fracture or suspicious lesion Sinuses/Orbits: No acute finding. Other: None IMPRESSION: 1. No definite CT evidence for acute intracranial abnormality. 2. Small vessel ischemic changes of the white matter and old appearing bilateral acute infarcts. Electronically Signed: By: Donavan Foil M.D. On: 04/24/2017 20:47    Chart has been reviewed    Assessment/Plan   68 y.o. female with medical history significant of diabetes, hypertension, hyperlipidemia, sick sinus syndrome history of CVA with residual left-sided weakness, CKD  Admitted for possible CVA versus acute neurological changes and acute encephalopathy Present on Admission: . Abnormal lateral conjugate gaze -will need evaluation for possible CVA  - will admit based on TIA/CVA protocol, await results of MRA/MRI,  Carotid Doppler and Echo, obtain cardiac enzymes,  ECG,   Lipid panel, TSH. Order PT/OT evaluation. Will make sure patient is on antiplatelet agent.   Neurology consult.      Marland Kitchen Uncontrolled diabetes mellitus ([place]) - - Order  Moderate  SSI   - continue home insulin regimen    Levemir 25 units,  -  check TSH and HgA1C  - Hold by mouth medications     . Hyperlipidemia - stable  will continue home medications . CKD (chronic kidney disease) stage 3, GFR 30-59 ml/min (HCC)  -currently stable avoid nephrotoxic medications monitor kidney function . Acute encephalopathy -possibly secondary to central abnormality we will obtain MRI to further evaluate check for any evidence of infection  . Essential hypertension  - permissive hypertension for today   Other plan as per orders.  DVT prophylaxis:  SCD    Code Status:  FULL CODE as per patient    Family Communication:   Family  at  Bedside  plan of care was discussed with  Son, Daughter  Disposition Plan:      To home once workup is complete and patient is stable                         Would benefit from PT/OT eval prior to DC  ordered                                                Consults called: Neurology   Admission status:    inpatient     Level of care   tele           I have spent a total of 56 min on this admission  Lysette Lindenbaum 04/24/2017, 11:18 PM    [facility]  Pager [phone]   after 2 AM please page floor coverage PA If 7AM-7PM, please contact the day team taking care of the patient  [email or web address]  Password [email or web address]

## 2017-04-24 NOTE — ED Triage Notes (Signed)
Patient c/o N/V, headache, and blurred vision x6 days. Denies diarrhea and abdominal pain. A&Ox3 in triage.

## 2017-04-24 NOTE — ED Provider Notes (Signed)
[facility] DEPT Provider Note   CSN: [id number] Arrival date & time: 04/24/17  1703     History   Chief Complaint Chief Complaint  Patient presents with  . Altered Mental Status  . Blurred Vision   Level 5 caveat secondary to patient's altered mental status. HPI Caitlin Morris is a 67 y.o. female with past medical history of stroke with left-sided weakness, diabetes, glaucoma, hyperlipidemia, hypertension, obesity, cataracts who presents the emergency department today for altered mental status.  Patient lives at home with her nephew.  Her last known normal was on Saturday.  Patient reportedly has been complaining of intermittent bilateral blurred vision since Thursday, 04/20/2017.  She has reportedly been having difficulty finding words and having repetitive speech.  She becomes confused about where she is.  She has been having daily episodes of nonbilious, nonbloody emesis. She reportedly has had new gaze preference to the right side since Saturday.  She is unable to provide thorough history due to altered mental status.  No reported fever, chills, headache, eye pain, eye redness, neck pain, fall, trauma, chest pain, shortness of breath, abdominal pain, diarrhea, urinary symptoms.  Patient has not started any new medications.  Family reports she had not taken her insulin for several days and recently restarted however.  HPI  Past Medical History:  Diagnosis Date  . Anxiety   . Arthritis    arthritis left foot and gout  . Cataract    not surgical yet  . Depression   . Diabetes mellitus without complication ([place])   . Glaucoma   . Hyperlipidemia   . Hypertension   . Obesity   . Stroke [facility])    left sided weakness remains-ambulates with cane.    Patient Active Problem List   Diagnosis Date Noted  . Hyperosmolar non-ketotic state in patient with type 2 diabetes mellitus ([place]) 05/20/2016  . Hyperlipidemia 05/20/2016  . Stroke ([place]) 05/20/2016  .  Arthritis 05/20/2016  . AKI (acute kidney injury) ([place]) 05/20/2016  . Glaucoma 05/20/2016  . Type 2 diabetes mellitus, uncontrolled, with neuropathy ([place]) 11/10/2015  . Polyneuropathy due to type 2 diabetes mellitus ([place]) 11/10/2015  . Essential hypertension 11/10/2015  . Bradycardia 11/10/2015  . Depression with anxiety 11/10/2015  . CKD (chronic kidney disease) stage 3, GFR 30-59 ml/min ([facility]) 11/10/2015  . Diabetic nephropathy ([place]) 11/10/2015  . Uncontrolled diabetes mellitus ([place]) 11/10/2015  . Arrhythmia 11/09/2015  . Cardiac arrhythmia 11/09/2015  . Hyperparathyroidism, primary ([place]) 11/08/2015  . Avulsion fracture of ankle 10/15/2015  . Osteoarthritis of left knee 10/01/2013  . DENTAL PAIN 10/16/2009  . VAGINAL DISCHARGE 07/07/2009  . ARTHRALGIA 07/07/2009  . RENAL INSUFFICIENCY 06/21/2009  . DEGENERATIVE JOINT DISEASE, LEFT KNEE 05/21/2009  . KNEE PAIN, LEFT 05/05/2009  . WEIGHT GAIN 05/05/2009  . PROTEINURIA 05/05/2009  . BIPOLAR DISORDER UNSPECIFIED 04/04/2008  . HYPERTENSION 10/31/2007  . OSTEOPENIA 10/24/2005  . Hypercalcemia 01/24/2005    Past Surgical History:  Procedure Laterality Date  . JOINT REPLACEMENT     Left TKA  . PARATHYROIDECTOMY Left 12/31/2015   Procedure: LEFT MINIMALLY INVASIVE PARATHYROIDECTOMY;  Surgeon: Armandina Gemma, MD;  Location: [facility];  Service: General;  Laterality: Left;  . TOTAL KNEE ARTHROPLASTY Left 10/01/2013   Procedure: TOTAL KNEE ARTHROPLASTY;  Surgeon: Nita Sells, MD;  Location: [facility];  Service: Orthopedics;  Laterality: Left;  Left total knee arthroplasty  . TUBAL LIGATION       OB History   None  Home Medications    Prior to Admission medications   Medication Sig Start Date End Date Taking? Authorizing Provider  allopurinol (ZYLOPRIM) 100 MG tablet Take 100 mg by mouth 2 (two) times daily.  10/22/15   [provider]  amLODipine (NORVASC) 10 MG tablet Take 10 mg by mouth daily.    [provider]  clonazePAM (KLONOPIN) 1 MG tablet Take 1 mg by mouth at bedtime.     [provider]  COMBIGAN 0.2-0.5 % ophthalmic solution Place 1 drop into both eyes every 12 (twelve) hours.  04/18/16   [provider]  gabapentin (NEURONTIN) 600 MG tablet Take 600 mg by mouth 2 (two) times daily.    [provider]  glucose blood (ACCU-CHEK ACTIVE STRIPS) test strip Use as instructed 11/11/15   Dhungel, Nishant, MD  glucose monitoring kit (FREESTYLE) monitoring kit 1 each by Does not apply route 4 (four) times daily - after meals and at bedtime. 1 month Diabetic Testing Supplies for QAC-QHS accuchecks. 11/11/15   Dhungel, Nishant, MD  insulin aspart (NOVOLOG) 100 UNIT/ML FlexPen Inject 8 Units into the skin 3 (three) times daily with meals. 05/23/16   Donne Hazel, MD  insulin glargine (LANTUS) 100 unit/mL SOPN Inject 0.42 mLs (42 Units total) into the skin daily. 05/23/16   Donne Hazel, MD  Insulin Pen Needle 29G X 42VZ MISC 1 application by Does not apply route at bedtime. 05/23/16   Donne Hazel, MD  Lancets (ACCU-CHEK SOFT [facility]) lancets Use as instructed 11/11/15   Dhungel, Nishant, MD  pravastatin (PRAVACHOL) 80 MG tablet Take 80 mg by mouth at bedtime. 07/10/15   [provider]  sertraline (ZOLOFT) 100 MG tablet Take 100 mg by mouth at bedtime.     [provider]  tetrahydrozoline-zinc (VISINE-AC) 0.05-0.25 % ophthalmic solution Place 2 drops into both eyes 3 (three) times daily as needed (DRY EYES).    [provider]    Family History Family History  Problem Relation Age of Onset  . Diabetes Sister   . Diabetes Son   . Breast cancer Cousin     Social History Social History   Tobacco Use  . Smoking status: Former Smoker    Packs/day: 1.00    Years: 30.00    Pack years: 30.00    Types: Cigarettes    Last attempt to quit: 01/24/2005    Years since quitting: 12.2  . Smokeless tobacco: Never Used  Substance Use  Topics  . Alcohol use: No  . Drug use: No     Allergies   Codeine   Review of Systems Review of Systems  All other systems reviewed and are negative.    Physical Exam Updated Vital Signs BP (!) 184/98 (BP Location: Right Arm)   Pulse (!) 55   Temp 98.4 F (36.9 C) (Oral)   Resp 18   Ht 5' 5"  (1.651 m)   Wt 83.9 kg (185 lb)   LMP  (LMP Unknown)   SpO2 99%   BMI 30.79 kg/m   Physical Exam  Constitutional: She appears well-developed and well-nourished.  HENT:  Head: Normocephalic and atraumatic.  Right Ear: External ear normal.  Left Ear: External ear normal.  Nose: Nose normal.  Mouth/Throat: Uvula is midline, oropharynx is clear and moist and mucous membranes are normal. No tonsillar exudate.  No temporal tenderness palpation.  Eyes: Pupils are equal, round, and reactive to light. Right eye exhibits no discharge. Left eye exhibits no discharge.  No scleral icterus.  Tono-Pen pressures: Right 16.95.  Left 20.95.  Neck: Trachea normal. Neck supple. No spinous process tenderness present. No neck rigidity. Normal range of motion present.  Cardiovascular: Normal rate, regular rhythm and intact distal pulses.  No murmur heard. Pulses:      Radial pulses are 2+ on the right side, and 2+ on the left side.       Dorsalis pedis pulses are 2+ on the right side, and 2+ on the left side.       Posterior tibial pulses are 2+ on the right side, and 2+ on the left side.  No lower extremity swelling or edema. Calves symmetric in size bilaterally.  Pulmonary/Chest: Effort normal and breath sounds normal. She has no decreased breath sounds. She has no wheezes. She has no rhonchi. She has no rales. She exhibits no tenderness.  Abdominal: Soft. Bowel sounds are normal. There is no tenderness. There is no rigidity, no rebound, no guarding and no CVA tenderness.  Musculoskeletal: She exhibits no edema.  Lymphadenopathy:    She has no cervical adenopathy.  Neurological: She is alert.    Patient is orientated to self and place.  She is unaware of time.  Believes it is 1990.  Patient has fluent speech but is repetitive.  She has episodes where she pauses during speech to find her words.  Patient appears to have right sided gaze preference.  Difficult to extraocular motions secondary to this.  Difficult to assess peripheral fields and visual acuity. Pupils are equal, round and reactive to light.  There is no a fair pupillary defect.  No proptosis.  Smile is symmetric.  Eyebrows raise symmetrically.  Uvula elevates symmetrically.  Tongue protrudes midline without fasciculations.  Normal shoulder shrug bilaterally. Decreased grip strength and lower extremity strength on the left side, greater on the left. Normal finger to nose.  No obvious pronator drift.  Skin: Skin is warm and dry. No rash noted. She is not diaphoretic.  Psychiatric: She has a normal mood and affect.  Nursing note and vitals reviewed.   ED Treatments / Results  Labs (all labs ordered are listed, but only abnormal results are displayed) Labs Reviewed  COMPREHENSIVE METABOLIC PANEL - Abnormal; Notable for the following components:      Result Value   Sodium 134 (*)    Chloride 93 (*)    Glucose, Bld 390 (*)    BUN 23 (*)    Creatinine, Ser 1.42 (*)    Alkaline Phosphatase 131 (*)    Total Bilirubin 1.6 (*)    GFR calc non Af Amer 37 (*)    GFR calc Af Amer 43 (*)    All other components within normal limits  URINALYSIS, ROUTINE W REFLEX MICROSCOPIC - Abnormal; Notable for the following components:   Glucose, UA >=500 (*)    Hgb urine dipstick SMALL (*)    Protein, ur >=300 (*)    Leukocytes, UA SMALL (*)    Squamous Epithelial / LPF 0-5 (*)    All other components within normal limits  CBC - Abnormal; Notable for the following components:   RBC 5.19 (*)    All other components within normal limits  LIPASE, BLOOD  APTT  PROTIME-INR  ETHANOL  RAPID URINE DRUG SCREEN, HOSP PERFORMED    EKG EKG  Interpretation  Date/Time:  Monday April 24 2017 20:49:21 EDT Ventricular Rate:  57 PR Interval:    QRS Duration: 103 QT Interval:  462 QTC Calculation: 450  R Axis:   19 Text Interpretation:  Sinus rhythm Prolonged PR interval LVH with secondary repolarization abnormality Confirmed by Quintella Reichert [phone]) on 04/25/2017 12:37:34 AM   Radiology Dg Chest 2 View  Result Date: 04/24/2017 CLINICAL DATA:  Altered mental status EXAM: CHEST - 2 VIEW COMPARISON:  08/29/2015 FINDINGS: No acute pulmonary infiltrate or effusion. Mild cardiomegaly with aortic atherosclerosis. No pneumothorax. Degenerative changes of the spine. IMPRESSION: No active cardiopulmonary disease.  Mild cardiomegaly. Electronically Signed   By: Donavan Foil M.D.   On: 04/24/2017 20:34   Ct Head Wo Contrast  Addendum Date: 04/24/2017   ADDENDUM REPORT: 04/24/2017 21:12 ADDENDUM: Correction to impression Impression #2 should read: Small vessel ischemic changes of the white matter with old appearing bilateral lacunar infarcts. Electronically Signed   By: Donavan Foil M.D.   On: 04/24/2017 21:12   Result Date: 04/24/2017 CLINICAL DATA:  Nausea vomiting headache and altered LOC EXAM: CT HEAD WITHOUT CONTRAST TECHNIQUE: Contiguous axial images were obtained from the base of the skull through the vertex without intravenous contrast. COMPARISON:  None. FINDINGS: Brain: No acute territorial infarction, hemorrhage or intracranial mass is visualized. Moderate small vessel ischemic changes of the white matter. Old appearing lacunar infarcts in the right basal ganglia and left caudate. Probable old left posterior white matter injury with mild asymmetric enlargement of the left atria. Vascular: No hyperdense vessels.  Carotid vascular calcification. Skull: No fracture or suspicious lesion Sinuses/Orbits: No acute finding. Other: None IMPRESSION: 1. No definite CT evidence for acute intracranial abnormality. 2. Small vessel ischemic changes of the  white matter and old appearing bilateral acute infarcts. Electronically Signed: By: Donavan Foil M.D. On: 04/24/2017 20:47    Procedures Procedures (including critical care time)  Medications Ordered in ED Medications  fluorescein ophthalmic strip 1 strip (1 strip Both Eyes Given 04/24/17 1912)  tetracaine (PONTOCAINE) 0.5 % ophthalmic solution 2 drop (2 drops Both Eyes Given 04/24/17 1912)     Initial Impression / Assessment and Plan / ED Course  I have reviewed the triage vital signs and the nursing notes.  Pertinent labs & imaging results that were available during my care of the patient were reviewed by me and considered in my medical decision making (see chart for details).      67 year old female with history of stroke and left-sided weakness now presenting with new right sided gaze preference since Saturday.  Patient is outside window for TPA therapy. Patient is orientated to place and person. Not to time.  Patient does have left-sided weakness that is consistent with prior CVA.  No neglect..  Patient has had difficulty finding words, and changes in vision.  Eye pressures not consistent with glaucoma.  No temporal tenderness or reported jaw claudication.  No leukocytosis.  No anemia.  UA does have small leukocytes and 6-30 white blood cells but this was a contaminated catch with 0-5 squamous epithelial cells.  Unlikely this is causing the patient's new gaze preference.  Will send for culture.  Patient does have elevated glucose of 390 but no anion gap acidosis to suggest DKA.  Electrolytes with mild derangements.  No significant intervention required.  Chest x-ray without active cardiopulmonary disease.  EKG as above.  CT of the head with no definitive CT evidence of acute intracranial abnormality.  There is small vessel ischemic changes as well as old infarcts.  Workup is being reassuring.  Feel patient will require admission for further stroke workup.  Again the patient is outside of time  window TPA therapy.  Patient to be admitted by Dr. Roel Cluck.   Spoke with Dr. Rory Percy who recommended [facility]. He will see the patient.   Spoke with family about this and they are aware.  They are in agreement with admission.  Patient case seen and discussed with Dr. Ralene Bathe who is in agreement with plan.   Final Clinical Impressions(s) / ED Diagnoses   Final diagnoses:  Abnormal lateral conjugate gaze  Blurry vision  History of CVA (cerebrovascular accident)    ED Discharge Orders    None       Lorelle Gibbs 04/25/17 0040    Quintella Reichert, MD 04/29/17 1140

## 2017-04-25 ENCOUNTER — Inpatient Hospital Stay (HOSPITAL_COMMUNITY): Payer: Medicare Other

## 2017-04-25 ENCOUNTER — Other Ambulatory Visit (HOSPITAL_COMMUNITY): Payer: Medicare Other

## 2017-04-25 ENCOUNTER — Other Ambulatory Visit: Payer: Self-pay

## 2017-04-25 DIAGNOSIS — Z9114 Patient's other noncompliance with medication regimen: Secondary | ICD-10-CM | POA: Diagnosis not present

## 2017-04-25 DIAGNOSIS — Z96652 Presence of left artificial knee joint: Secondary | ICD-10-CM | POA: Diagnosis not present

## 2017-04-25 DIAGNOSIS — E785 Hyperlipidemia, unspecified: Secondary | ICD-10-CM | POA: Diagnosis not present

## 2017-04-25 DIAGNOSIS — G40901 Epilepsy, unspecified, not intractable, with status epilepticus: Secondary | ICD-10-CM | POA: Diagnosis not present

## 2017-04-25 DIAGNOSIS — R569 Unspecified convulsions: Secondary | ICD-10-CM

## 2017-04-25 DIAGNOSIS — Z8673 Personal history of transient ischemic attack (TIA), and cerebral infarction without residual deficits: Secondary | ICD-10-CM

## 2017-04-25 DIAGNOSIS — H538 Other visual disturbances: Secondary | ICD-10-CM | POA: Diagnosis not present

## 2017-04-25 DIAGNOSIS — L89302 Pressure ulcer of unspecified buttock, stage 2: Secondary | ICD-10-CM | POA: Diagnosis present

## 2017-04-25 DIAGNOSIS — I10 Essential (primary) hypertension: Secondary | ICD-10-CM | POA: Diagnosis not present

## 2017-04-25 DIAGNOSIS — G9340 Encephalopathy, unspecified: Secondary | ICD-10-CM | POA: Diagnosis not present

## 2017-04-25 DIAGNOSIS — E1151 Type 2 diabetes mellitus with diabetic peripheral angiopathy without gangrene: Secondary | ICD-10-CM | POA: Diagnosis not present

## 2017-04-25 DIAGNOSIS — H409 Unspecified glaucoma: Secondary | ICD-10-CM | POA: Diagnosis not present

## 2017-04-25 DIAGNOSIS — Z833 Family history of diabetes mellitus: Secondary | ICD-10-CM | POA: Diagnosis not present

## 2017-04-25 DIAGNOSIS — F419 Anxiety disorder, unspecified: Secondary | ICD-10-CM | POA: Diagnosis not present

## 2017-04-25 DIAGNOSIS — H47619 Cortical blindness, unspecified side of brain: Secondary | ICD-10-CM | POA: Diagnosis not present

## 2017-04-25 DIAGNOSIS — R4182 Altered mental status, unspecified: Secondary | ICD-10-CM | POA: Diagnosis present

## 2017-04-25 DIAGNOSIS — G9341 Metabolic encephalopathy: Secondary | ICD-10-CM | POA: Diagnosis not present

## 2017-04-25 DIAGNOSIS — H543 Unqualified visual loss, both eyes: Secondary | ICD-10-CM | POA: Diagnosis not present

## 2017-04-25 DIAGNOSIS — E1122 Type 2 diabetes mellitus with diabetic chronic kidney disease: Secondary | ICD-10-CM | POA: Diagnosis not present

## 2017-04-25 DIAGNOSIS — R930 Abnormal findings on diagnostic imaging of skull and head, not elsewhere classified: Secondary | ICD-10-CM | POA: Diagnosis not present

## 2017-04-25 DIAGNOSIS — Z6830 Body mass index (BMI) 30.0-30.9, adult: Secondary | ICD-10-CM | POA: Diagnosis not present

## 2017-04-25 DIAGNOSIS — I69354 Hemiplegia and hemiparesis following cerebral infarction affecting left non-dominant side: Secondary | ICD-10-CM | POA: Diagnosis not present

## 2017-04-25 DIAGNOSIS — Z87891 Personal history of nicotine dependence: Secondary | ICD-10-CM | POA: Diagnosis not present

## 2017-04-25 DIAGNOSIS — I495 Sick sinus syndrome: Secondary | ICD-10-CM | POA: Diagnosis not present

## 2017-04-25 DIAGNOSIS — E669 Obesity, unspecified: Secondary | ICD-10-CM | POA: Diagnosis not present

## 2017-04-25 DIAGNOSIS — E1142 Type 2 diabetes mellitus with diabetic polyneuropathy: Secondary | ICD-10-CM | POA: Diagnosis not present

## 2017-04-25 DIAGNOSIS — M109 Gout, unspecified: Secondary | ICD-10-CM | POA: Diagnosis not present

## 2017-04-25 DIAGNOSIS — I639 Cerebral infarction, unspecified: Secondary | ICD-10-CM | POA: Diagnosis not present

## 2017-04-25 DIAGNOSIS — I129 Hypertensive chronic kidney disease with stage 1 through stage 4 chronic kidney disease, or unspecified chronic kidney disease: Secondary | ICD-10-CM | POA: Diagnosis not present

## 2017-04-25 DIAGNOSIS — F329 Major depressive disorder, single episode, unspecified: Secondary | ICD-10-CM | POA: Diagnosis not present

## 2017-04-25 DIAGNOSIS — H510 Palsy (spasm) of conjugate gaze: Secondary | ICD-10-CM | POA: Diagnosis not present

## 2017-04-25 DIAGNOSIS — N183 Chronic kidney disease, stage 3 (moderate): Secondary | ICD-10-CM | POA: Diagnosis not present

## 2017-04-25 DIAGNOSIS — M19072 Primary osteoarthritis, left ankle and foot: Secondary | ICD-10-CM | POA: Diagnosis not present

## 2017-04-25 LAB — LACTIC ACID, PLASMA
LACTIC ACID, VENOUS: 1.5 mmol/L (ref 0.5–1.9)
LACTIC ACID, VENOUS: 1 mmol/L (ref 0.5–1.9)

## 2017-04-25 LAB — BLOOD GAS, ARTERIAL
ACID-BASE EXCESS: 8 mmol/L — AB (ref 0.0–2.0)
Bicarbonate: 32.1 mmol/L — ABNORMAL HIGH (ref 20.0–28.0)
DRAWN BY: 358491
FIO2: 21
O2 SAT: 88.8 %
PATIENT TEMPERATURE: 98.6
PCO2 ART: 45.5 mmHg (ref 32.0–48.0)
pH, Arterial: 7.462 — ABNORMAL HIGH (ref 7.350–7.450)
pO2, Arterial: 54.6 mmHg — ABNORMAL LOW (ref 83.0–108.0)

## 2017-04-25 LAB — BASIC METABOLIC PANEL
Anion gap: 14 (ref 5–15)
BUN: 24 mg/dL — AB (ref 6–20)
CALCIUM: 9.4 mg/dL (ref 8.9–10.3)
CHLORIDE: 98 mmol/L — AB (ref 101–111)
CO2: 27 mmol/L (ref 22–32)
CREATININE: 1.53 mg/dL — AB (ref 0.44–1.00)
GFR calc non Af Amer: 34 mL/min — ABNORMAL LOW (ref >60)
GFR, EST AFRICAN AMERICAN: 39 mL/min — AB (ref >60)
Glucose, Bld: 203 mg/dL — ABNORMAL HIGH (ref 65–99)
Potassium: 3.1 mmol/L — ABNORMAL LOW (ref 3.5–5.1)
Sodium: 139 mmol/L (ref 135–145)

## 2017-04-25 LAB — GLUCOSE, CAPILLARY
GLUCOSE-CAPILLARY: 256 mg/dL — AB (ref 65–99)
GLUCOSE-CAPILLARY: 188 mg/dL — AB (ref 65–99)
GLUCOSE-CAPILLARY: 204 mg/dL — AB (ref 65–99)
Glucose-Capillary: 420 mg/dL — ABNORMAL HIGH (ref 65–99)
Glucose-Capillary: 306 mg/dL — ABNORMAL HIGH (ref 65–99)

## 2017-04-25 LAB — HEMOGLOBIN A1C
Hgb A1c MFr Bld: 11.3 % — ABNORMAL HIGH (ref 4.8–5.6)
MEAN PLASMA GLUCOSE: 277.61 mg/dL

## 2017-04-25 LAB — LIPID PANEL
CHOLESTEROL: 195 mg/dL (ref 0–200)
HDL: 38 mg/dL — ABNORMAL LOW (ref >40)
LDL Cholesterol: 117 mg/dL — ABNORMAL HIGH (ref 0–99)
Total CHOL/HDL Ratio: 5.1 RATIO
Triglycerides: 198 mg/dL — ABNORMAL HIGH (ref <150)
VLDL: 40 mg/dL (ref 0–40)

## 2017-04-25 LAB — TROPONIN I
TROPONIN I: 0.05 ng/mL — AB (ref <0.03)
Troponin I: 0.06 ng/mL (ref <0.03)

## 2017-04-25 LAB — CBG MONITORING, ED: Glucose-Capillary: 398 mg/dL — ABNORMAL HIGH (ref 65–99)

## 2017-04-25 MED ORDER — LORAZEPAM 2 MG/ML IJ SOLN: 1.0000 mg | Freq: Once | INTRAMUSCULAR | Status: DC

## 2017-04-25 MED ORDER — ASPIRIN 325 MG PO TABS
325.0000 mg | ORAL_TABLET | Freq: Every day | ORAL | Status: DC
  Administered 2017-04-25 – 2017-05-01 (×5): 325 mg via ORAL
  Filled 2017-04-25 (×6): qty 1

## 2017-04-25 MED ORDER — INSULIN ASPART 100 UNIT/ML ~~LOC~~ SOLN
0.0000 [IU] | Freq: Three times a day (TID) | SUBCUTANEOUS | Status: DC
  Administered 2017-04-25: 3 [IU] via SUBCUTANEOUS
  Administered 2017-04-25: 15 [IU] via SUBCUTANEOUS
  Administered 2017-04-25: 11 [IU] via SUBCUTANEOUS
  Administered 2017-04-26: 5 [IU] via SUBCUTANEOUS
  Administered 2017-04-26: 3 [IU] via SUBCUTANEOUS
  Administered 2017-04-26: 5 [IU] via SUBCUTANEOUS
  Administered 2017-04-27 (×3): 2 [IU] via SUBCUTANEOUS
  Administered 2017-04-28 – 2017-04-29 (×3): 3 [IU] via SUBCUTANEOUS
  Administered 2017-04-29 (×2): 2 [IU] via SUBCUTANEOUS
  Administered 2017-04-30: 8 [IU] via SUBCUTANEOUS
  Administered 2017-04-30: 2 [IU] via SUBCUTANEOUS
  Administered 2017-04-30 – 2017-05-01 (×2): 5 [IU] via SUBCUTANEOUS
  Administered 2017-05-01: 8 [IU] via SUBCUTANEOUS
  Administered 2017-05-01: 5 [IU] via SUBCUTANEOUS

## 2017-04-25 MED ORDER — ONDANSETRON HCL 4 MG/2ML IJ SOLN
4.0000 mg | Freq: Four times a day (QID) | INTRAMUSCULAR | Status: DC | PRN
  Administered 2017-04-25: 4 mg via INTRAVENOUS
  Filled 2017-04-25: qty 2

## 2017-04-25 MED ORDER — LEVETIRACETAM IN NACL 1000 MG/100ML IV SOLN: 1000.0000 mg | Freq: Once | INTRAVENOUS | Status: DC

## 2017-04-25 MED ORDER — SENNOSIDES-DOCUSATE SODIUM 8.6-50 MG PO TABS
1.0000 | ORAL_TABLET | Freq: Every evening | ORAL | Status: DC | PRN
  Administered 2017-04-30: 1 via ORAL
  Filled 2017-04-25: qty 1

## 2017-04-25 MED ORDER — STROKE: EARLY STAGES OF RECOVERY BOOK
Freq: Once | Status: AC
  Administered 2017-04-25: 06:00:00
  Filled 2017-04-25: qty 1

## 2017-04-25 MED ORDER — POTASSIUM CHLORIDE CRYS ER 20 MEQ PO TBCR: 30.0000 meq | EXTENDED_RELEASE_TABLET | Freq: Once | ORAL | Status: DC

## 2017-04-25 MED ORDER — BRIMONIDINE TARTRATE-TIMOLOL 0.2-0.5 % OP SOLN: 1.0000 [drp] | Freq: Two times a day (BID) | OPHTHALMIC | Status: DC

## 2017-04-25 MED ORDER — ASPIRIN 300 MG RE SUPP
300.0000 mg | Freq: Every day | RECTAL | Status: DC
  Filled 2017-04-25 (×2): qty 1

## 2017-04-25 MED ORDER — POTASSIUM CHLORIDE 10 MEQ/100ML IV SOLN
10.0000 meq | INTRAVENOUS | Status: AC
  Administered 2017-04-25 (×3): 10 meq via INTRAVENOUS
  Filled 2017-04-25 (×3): qty 100

## 2017-04-25 MED ORDER — AMLODIPINE BESYLATE 10 MG PO TABS
10.0000 mg | ORAL_TABLET | Freq: Every day | ORAL | Status: DC
  Administered 2017-04-25 – 2017-05-01 (×6): 10 mg via ORAL
  Filled 2017-04-25 (×6): qty 1

## 2017-04-25 MED ORDER — CLONAZEPAM 1 MG PO TABS
1.0000 mg | ORAL_TABLET | Freq: Every day | ORAL | Status: DC
  Administered 2017-04-26 – 2017-04-30 (×5): 1 mg via ORAL
  Filled 2017-04-25 (×5): qty 1

## 2017-04-25 MED ORDER — ACETAMINOPHEN 160 MG/5ML PO SOLN: 650.0000 mg | ORAL | Status: DC | PRN

## 2017-04-25 MED ORDER — GABAPENTIN 600 MG PO TABS
600.0000 mg | ORAL_TABLET | Freq: Two times a day (BID) | ORAL | Status: DC
  Administered 2017-04-25 – 2017-05-01 (×11): 600 mg via ORAL
  Filled 2017-04-25 (×11): qty 1

## 2017-04-25 MED ORDER — BRIMONIDINE TARTRATE 0.2 % OP SOLN
1.0000 [drp] | Freq: Two times a day (BID) | OPHTHALMIC | Status: DC
  Administered 2017-04-25 – 2017-05-01 (×12): 1 [drp] via OPHTHALMIC
  Filled 2017-04-25: qty 5

## 2017-04-25 MED ORDER — FOSPHENYTOIN SODIUM 500 MG PE/10ML IJ SOLN
1500.0000 mg | Freq: Once | INTRAMUSCULAR | Status: AC
  Administered 2017-04-25: 1500 mg via INTRAVENOUS
  Filled 2017-04-25: qty 30

## 2017-04-25 MED ORDER — LEVETIRACETAM IN NACL 1000 MG/100ML IV SOLN
1000.0000 mg | Freq: Two times a day (BID) | INTRAVENOUS | Status: DC
  Administered 2017-04-26: 1000 mg via INTRAVENOUS
  Filled 2017-04-25: qty 100

## 2017-04-25 MED ORDER — LEVETIRACETAM IN NACL 1500 MG/100ML IV SOLN
1500.0000 mg | INTRAVENOUS | Status: AC
  Administered 2017-04-25: 1500 mg via INTRAVENOUS
  Filled 2017-04-25: qty 100

## 2017-04-25 MED ORDER — SERTRALINE HCL 100 MG PO TABS
100.0000 mg | ORAL_TABLET | Freq: Every day | ORAL | Status: DC
  Administered 2017-04-26 – 2017-04-30 (×5): 100 mg via ORAL
  Filled 2017-04-25 (×5): qty 1

## 2017-04-25 MED ORDER — ACETAMINOPHEN 325 MG PO TABS
650.0000 mg | ORAL_TABLET | ORAL | Status: DC | PRN
  Administered 2017-04-28: 650 mg via ORAL
  Filled 2017-04-25: qty 2

## 2017-04-25 MED ORDER — LORAZEPAM 2 MG/ML IJ SOLN
1.0000 mg | Freq: Once | INTRAMUSCULAR | Status: AC
Start: 2017-04-25 — End: 2017-04-25
  Administered 2017-04-25: 1 mg via INTRAVENOUS
  Filled 2017-04-25: qty 1

## 2017-04-25 MED ORDER — ACETAMINOPHEN 650 MG RE SUPP: 650.0000 mg | RECTAL | Status: DC | PRN

## 2017-04-25 MED ORDER — INSULIN ASPART 100 UNIT/ML ~~LOC~~ SOLN
0.0000 [IU] | Freq: Every day | SUBCUTANEOUS | Status: DC
  Administered 2017-04-25 – 2017-04-29 (×2): 2 [IU] via SUBCUTANEOUS
  Administered 2017-04-30: 3 [IU] via SUBCUTANEOUS

## 2017-04-25 MED ORDER — PRAVASTATIN SODIUM 40 MG PO TABS
80.0000 mg | ORAL_TABLET | Freq: Every day | ORAL | Status: DC
  Administered 2017-04-26 – 2017-04-30 (×5): 80 mg via ORAL
  Filled 2017-04-25 (×5): qty 2

## 2017-04-25 MED ORDER — LEVETIRACETAM IN NACL 1500 MG/100ML IV SOLN: 1500.0000 mg | Freq: Once | INTRAVENOUS | Status: DC

## 2017-04-25 MED ORDER — ALLOPURINOL 100 MG PO TABS
100.0000 mg | ORAL_TABLET | Freq: Two times a day (BID) | ORAL | Status: DC
  Administered 2017-04-25 – 2017-05-01 (×11): 100 mg via ORAL
  Filled 2017-04-25 (×11): qty 1

## 2017-04-25 MED ORDER — SODIUM CHLORIDE 0.9 % IV SOLN
INTRAVENOUS | Status: DC
  Administered 2017-04-25: 20:00:00 via INTRAVENOUS

## 2017-04-25 MED ORDER — INSULIN DETEMIR 100 UNIT/ML ~~LOC~~ SOLN
25.0000 [IU] | Freq: Every day | SUBCUTANEOUS | Status: DC
  Administered 2017-04-25 – 2017-04-30 (×6): 25 [IU] via SUBCUTANEOUS
  Filled 2017-04-25 (×7): qty 0.25

## 2017-04-25 MED ORDER — SODIUM CHLORIDE 0.9 % IV SOLN
INTRAVENOUS | Status: DC
  Administered 2017-04-25: 06:00:00 via INTRAVENOUS

## 2017-04-25 MED ORDER — LEVETIRACETAM IN NACL 500 MG/100ML IV SOLN: 500.0000 mg | Freq: Two times a day (BID) | INTRAVENOUS | Status: DC

## 2017-04-25 MED ORDER — SODIUM CHLORIDE 0.9 % IV SOLN
INTRAVENOUS | Status: DC
  Administered 2017-04-26 – 2017-04-27 (×2): via INTRAVENOUS

## 2017-04-25 MED ORDER — TIMOLOL MALEATE 0.5 % OP SOLN
1.0000 [drp] | Freq: Two times a day (BID) | OPHTHALMIC | Status: DC
  Administered 2017-04-25 – 2017-05-01 (×12): 1 [drp] via OPHTHALMIC
  Filled 2017-04-25: qty 5

## 2017-04-25 NOTE — Progress Notes (Signed)
Inpatient Diabetes Program Recommendations  AACE/ADA: New Consensus Statement on Inpatient Glycemic Control (2015)  Target Ranges:  Prepandial:   less than 140 mg/dL      Peak postprandial:   less than 180 mg/dL (1-2 hours)      Critically ill patients:  140 - 180 mg/dL   Spoke with patient about diabetes and home regimen for diabetes control. Patient reports that she lives with her son and nephew at home. Per patient report her nephew gives her insulin before each meal and her long acting insulin at night. Son and nephew were not present during conversation. Patient reports that she "tries to eat a few crackers or so in order to take her insulin." Unsure if patient has lost weight. Spoke with patient about trying to eat balanced three small meals a day, if not there were supplement that she might could try per RD. RD visited patient today. Discussed current A1c level this admisson 11.3% and covered glucose and A1c goals. Spoke with patient and daughter at bedside to follow up with PCP and have her insulin titrated outpatient as her A1c indicates further need for titration.  Thanks,  Tama Headings RN, MSN, [facility] Inpatient Diabetes Coordinator Team Pager [phone] (8a-5p) '

## 2017-04-25 NOTE — Progress Notes (Signed)
Stroke Team Progress Note  Caitlin Morris is a 67 y.o. 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  According to history provided by family and chart review, patient had been complaining of blurred vision and was appearing confused.  Her speech was repetitive and she did not know where she was.  She did have some nausea and vomiting that continued for the past 5-6 days.  No fevers or chills.  No neck pain or neck stiffness.  Does not have diarrhea. Patient is noncompliant to her insulin.  On arrival, her sugar levels were found to be in high 300s.  Labs not suggestive of DKA.  Her creatinine was also elevated to 1.42. Patient denies any lateralized weakness more than usual-left-sided weakness is baseline. She denies any current headache but headache has been ongoing for the past few days per family. On examination in the emergency room, she was also noted to have a rightward gaze preference. Because of the multitude of symptoms above, a neurological consultation was placed. Patient said that she is on antidepressants and antianxiety medications which are keeping her anxiety and depression under good control and she has no suicidal or homicidal ideation. She does not abuse drugs , prescribed or illicit.   LKW: 0900 04/20/17 tpa given?: no,  Premorbid modified Rankin scale (mRS): 3   SUBJECTIVE Patient's daughter is at the bedside. They report that the patient developed sudden onset of nausea and threw up and then subsequently reported poor vision in both eyes. This has not improved. She does have a remote history of stroke with residual spastic left hemiparesis but her strength  appears not to have changed. She was admitted with elevated blood sugar but was not found to be in diabetic ketoacidosis. Patient and family deny usage of any illicit drugs  OBJECTIVE Most recent Vital Signs: Temp: 99.3 F (37.4 C) (04/02 0611) Temp Source: Oral (04/02 0611) BP: 158/94 (04/02 0611) Pulse Rate: 59 (04/02 0611) Respiratory Rate: 16 O2 Saturdation: 97%  CBG (last 3)  Recent Labs    04/25/17 0429 04/25/17 0757 04/25/17 1209  GLUCAP 398* 420* 306*    Diet: Diet Carb Modified Fluid consistency: Thin; Room service appropriate? Yes Fall precautions Aspiration precautions    Activity: Up with assistance   VTE Prophylaxis:  scds Studies: Results for orders placed or performed during the hospital encounter of 04/24/17 (from the past 24 hour(s))  Lipase, blood     Status: None   Collection Time: 04/24/17  5:30 PM  Result Value Ref Range   Lipase 33 11 - 51 U/L  Comprehensive metabolic panel     Status: Abnormal   Collection Time: 04/24/17  5:30 PM  Result Value Ref Range   Sodium 134 (L) 135 - 145 mmol/L   Potassium 3.6 3.5 - 5.1 mmol/L   Chloride 93 (L) 101 - 111 mmol/L   CO2 26 22 - 32 mmol/L   Glucose, Bld 390 (H) 65 - 99 mg/dL   BUN 23 (H) 6 - 20 mg/dL   Creatinine, Ser 1.42 (H) 0.44 - 1.00 mg/dL   Calcium 9.8 8.9 - 10.3 mg/dL   Total Protein 8.1 6.5 - 8.1 g/dL   Albumin 3.9 3.5 - 5.0 g/dL  AST 22 15 - 41 U/L   ALT 14 14 - 54 U/L   Alkaline Phosphatase 131 (H) 38 - 126 U/L   Total Bilirubin 1.6 (H) 0.3 - 1.2 mg/dL   GFR calc non Af Amer 37 (L) >60 mL/min   GFR calc Af Amer 43 (L) >60 mL/min   Anion gap 15 5 - 15  Urinalysis, Routine w reflex microscopic     Status: Abnormal   Collection Time: 04/24/17  5:59 PM  Result Value Ref Range   Color, Urine YELLOW YELLOW   APPearance CLEAR CLEAR   Specific Gravity, Urine 1.026 1.005 - 1.030   pH 6.0 5.0 - 8.0   Glucose, UA >=500 (A) NEGATIVE mg/dL   Hgb urine dipstick SMALL (A) NEGATIVE   Bilirubin  Urine NEGATIVE NEGATIVE   Ketones, ur NEGATIVE NEGATIVE mg/dL   Protein, ur >=300 (A) NEGATIVE mg/dL   Nitrite NEGATIVE NEGATIVE   Leukocytes, UA SMALL (A) NEGATIVE   RBC / HPF 6-30 0 - 5 RBC/hpf   WBC, UA 6-30 0 - 5 WBC/hpf   Bacteria, UA NONE SEEN NONE SEEN   Squamous Epithelial / LPF 0-5 (A) NONE SEEN  CBC     Status: Abnormal   Collection Time: 04/24/17  6:05 PM  Result Value Ref Range   WBC 9.4 4.0 - 10.5 K/uL   RBC 5.19 (H) 3.87 - 5.11 MIL/uL   Hemoglobin 14.5 12.0 - 15.0 g/dL   HCT 43.4 36.0 - 46.0 %   MCV 83.6 78.0 - 100.0 fL   MCH 27.9 26.0 - 34.0 pg   MCHC 33.4 30.0 - 36.0 g/dL   RDW 14.1 11.5 - 15.5 %   Platelets 305 150 - 400 K/uL  APTT     Status: None   Collection Time: 04/24/17  8:14 PM  Result Value Ref Range   aPTT 28 24 - 36 seconds  Protime-INR     Status: None   Collection Time: 04/24/17  8:14 PM  Result Value Ref Range   Prothrombin Time 15.0 11.4 - 15.2 seconds   INR 1.19   Ethanol     Status: None   Collection Time: 04/24/17  8:14 PM  Result Value Ref Range   Alcohol, Ethyl (B) <10 <10 mg/dL  Rapid urine drug screen (hospital performed)     Status: None   Collection Time: 04/24/17  8:14 PM  Result Value Ref Range   Opiates NONE DETECTED NONE DETECTED   Cocaine NONE DETECTED NONE DETECTED   Benzodiazepines NONE DETECTED NONE DETECTED   Amphetamines NONE DETECTED NONE DETECTED   Tetrahydrocannabinol NONE DETECTED NONE DETECTED   Barbiturates NONE DETECTED NONE DETECTED  Troponin I (q 6hr x 3)     Status: Abnormal   Collection Time: 04/25/17 12:00 AM  Result Value Ref Range   Troponin I 0.05 (HH) <0.03 ng/mL  CBG monitoring, ED     Status: Abnormal   Collection Time: 04/25/17  4:29 AM  Result Value Ref Range   Glucose-Capillary 398 (H) 65 - 99 mg/dL  Hemoglobin A1c     Status: Abnormal   Collection Time: 04/25/17  6:35 AM  Result Value Ref Range   Hgb A1c MFr Bld 11.3 (H) 4.8 - 5.6 %   Mean Plasma Glucose 277.61 mg/dL  Lipid panel      Status: Abnormal   Collection Time: 04/25/17  6:35 AM  Result Value Ref Range   Cholesterol 195 0 - 200 mg/dL   Triglycerides 198 (H) <150  mg/dL   HDL 38 (L) >40 mg/dL   Total CHOL/HDL Ratio 5.1 RATIO   VLDL 40 0 - 40 mg/dL   LDL Cholesterol 117 (H) 0 - 99 mg/dL  Glucose, capillary     Status: Abnormal   Collection Time: 04/25/17  7:57 AM  Result Value Ref Range   Glucose-Capillary 420 (H) 65 - 99 mg/dL  Glucose, capillary     Status: Abnormal   Collection Time: 04/25/17 12:09 PM  Result Value Ref Range   Glucose-Capillary 306 (H) 65 - 99 mg/dL     Dg Chest 2 View  Result Date: 04/24/2017 CLINICAL DATA:  Altered mental status EXAM: CHEST - 2 VIEW COMPARISON:  08/29/2015 FINDINGS: No acute pulmonary infiltrate or effusion. Mild cardiomegaly with aortic atherosclerosis. No pneumothorax. Degenerative changes of the spine. IMPRESSION: No active cardiopulmonary disease.  Mild cardiomegaly. Electronically Signed   By: Donavan Foil M.D.   On: 04/24/2017 20:34   Ct Head Wo Contrast  Addendum Date: 04/24/2017   ADDENDUM REPORT: 04/24/2017 21:12 ADDENDUM: Correction to impression Impression #2 should read: Small vessel ischemic changes of the white matter with old appearing bilateral lacunar infarcts. Electronically Signed   By: Donavan Foil M.D.   On: 04/24/2017 21:12   Result Date: 04/24/2017 CLINICAL DATA:  Nausea vomiting headache and altered LOC EXAM: CT HEAD WITHOUT CONTRAST TECHNIQUE: Contiguous axial images were obtained from the base of the skull through the vertex without intravenous contrast. COMPARISON:  None. FINDINGS: Brain: No acute territorial infarction, hemorrhage or intracranial mass is visualized. Moderate small vessel ischemic changes of the white matter. Old appearing lacunar infarcts in the right basal ganglia and left caudate. Probable old left posterior white matter injury with mild asymmetric enlargement of the left atria. Vascular: No hyperdense vessels.  Carotid vascular  calcification. Skull: No fracture or suspicious lesion Sinuses/Orbits: No acute finding. Other: None IMPRESSION: 1. No definite CT evidence for acute intracranial abnormality. 2. Small vessel ischemic changes of the white matter and old appearing bilateral acute infarcts. Electronically Signed: By: Donavan Foil M.D. On: 04/24/2017 20:47    Physical Exam:    Frail middle-aged African-American lady currently not in distress. . Afebrile. Head is nontraumatic. Neck is supple without bruit.    Cardiac exam no murmur or gallop. Lungs are clear to auscultation. Distal pulses are well felt. Neurological Exam :  She is drowsy but can arouse easily. She is oriented to self and place. Disoriented to time. She has diminished attention, registration and recall. Speech is clear without dysarthria or aphasia. Patient's eyes are mostly deviated to the left but she does look to the right She can move gaze in all directions. 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 Pupils are both 4 mm and react sluggishly to right. Fundi were not visualized. She does not have light perception without projection. Face is symmetric without weakness tongue is midline. Motor system exam she has spastic left hemiparesis 4/5 strength with poor and variable effort. His weakness of left grip and intrinsic hand muscles and left hip flexors and ankle dorsiflexors. Right-sided strength appears normal. Sensation appears intact to touch and pinprick. Coordination difficult to test due to poor vision. Deep tendon reflexes are depressed. Plantars are downgoing. ASSESSMENT Ms. LARYSA PALL is a 67 y.o. female  Who presented with subacute history of bilateral vision loss in the setting of significantly elevated blood pressure and sugar etiology possibly hypertensive encephalopathy versus posterior reversible encephalopathy syndrome versus occipital seizures.  Bilateral occipital infarcts in the absence of other focal symptoms would  be less likely. Hospital day # 1  TREATMENT/PLAN  recommend check EEG for seizures and MRI scan of the brain 4 evidence of stroke versus posterior reversible encephalopathy syndrome. Strict control of blood pressure with blood pressure goal systolic range between 973-532. Continue ongoing stroke workup. Long discussion with the patient and daughter at the bedside and answered questions. Discussed with Dr. Loleta Books. Greater than 50% time during this 35 minute visit was spent on counseling and coordination of care about her neurological presentation plan for evaluation and treatment and answering questions I have personally examined this patient, reviewed notes, independently viewed imaging studies, participated in medical decision making and plan of care.ROS completed by me personally and pertinent positives fully documented  I have made any additions or clarifications directly to the above note.   Caitlin Contras, MD Medical Director [place] Pager: [phone] 04/25/2017 1:07 PM  Caitlin Contras, MD [facility] Pager: [phone] 04/25/2017 1:00 PM

## 2017-04-25 NOTE — ED Notes (Signed)
carelink at bedside 

## 2017-04-25 NOTE — Progress Notes (Addendum)
Patient admitted with "cortical blindness". She is lying in bed,  EEG tech at bedside placing leads.  Patient lying relaxed in the bed, not responding to staff. Oral care done.  Patient grimaces and swats oral swab away with left hand.  When I attempted to open her eyes to assess her pupils she squeezed eyes tight, so that I was unable to open her eyes.  Patient not following commands.   BP 112/70  HR 52  RR 16  O2 sats 94% on RA.    1630: EEG tech at bedside performing EEG:  Per Dr Leonel Ramsay patient with seizure activity.  1 mg Ativan given IV, orders received for Keppra. Dr Leonie Man at bedside to assess patient.  Spoke with family members.  Only clinical sign of seizure activity noted was head tilt to the left and eye deviation to the left per EEG tech who was in the room.    Plan:  During continuous EEG, neurologist will notify bedside staff if any seizure activity noted and if need for treatment.  RN to call if assistance needed.

## 2017-04-25 NOTE — ED Notes (Signed)
Date and time results received: 04/25/17 00:50 (use smartphrase ".now" to insert current time)  Test: Troponin  Critical Value: 0.05  Name of Provider Notified: Dr. Toy Baker   Orders Received? Or Actions Taken?: Will continue to monitor and await any new orders.

## 2017-04-25 NOTE — Evaluation (Signed)
Physical Therapy Evaluation Patient Details Name: Caitlin Morris MRN: [id number] DOB: 06-24-1950 Today's Date: 04/25/2017   History of Present Illness  Pt. is a 67 y.o. F with significant PMH of diabetes, hypertension, hyperlipidemia, sick sinus syndrome, previous CVA with residual left sided weakness, and CKD. Presenting with 6 days of nausea/vomiting, occasional headaches, confusion and blurry vision.  CT negative for acute changes and ntoed small vessel ischemic changes of the white matter with old appearing bilateral lacunar infarcts.   Clinical Impression  Evaluation significantly limited by patient fatigue. Patient was responsive to noxious stimuli but eyes closed through majority of the session, was nonverbal, and not responding to simple commands, Total assist + 2 for bed mobility and sitting balance edge of bed. Transfer not safe to attempt at this time. RN and MD notified and aware. After discussion with RN, Jinny Blossom, this is a change from patient status prior to evaluation where she was alert and complaining of nausea and impaired vision. Will continue to follow acutely.     Follow Up Recommendations SNF    Equipment Recommendations  Other (comment)(Defer to SNF)    Recommendations for Other Services       Precautions / Restrictions Precautions Precautions: Fall Restrictions Weight Bearing Restrictions: No      Mobility  Bed Mobility Overal bed mobility: Needs Assistance Bed Mobility: Supine to Sit;Sit to Supine     Supine to sit: Total assist;+2 for physical assistance     General bed mobility comments: Total assist + 2 for all bed mobility, with assisting BLE's off of bed and then elevating trunk up to sitting.   Transfers                 General transfer comment: Not safe at this time.   Ambulation/Gait             General Gait Details: Not safe at this time.   Stairs            Wheelchair Mobility    Modified Rankin (Stroke Patients  Only) Modified Rankin (Stroke Patients Only) Pre-Morbid Rankin Score: Slight disability Modified Rankin: Severe disability     Balance Overall balance assessment: Needs assistance Sitting-balance support: Bilateral upper extremity supported;Feet supported Sitting balance-Leahy Scale: Zero Sitting balance - Comments: Total assist provided for sitting balance due to posterior lean.  Postural control: Posterior lean                                   Pertinent Vitals/Pain Pain Assessment: Faces Faces Pain Scale: No hurt    Home Living Family/patient expects to be discharged to:: Private residence Living Arrangements: Other relatives(2 cousins) Available Help at Discharge: Family Type of Home: House Home Access: Stairs to enter Entrance Stairs-Rails: Right Entrance Stairs-Number of Steps: 3 Home Layout: One level Home Equipment: Cane - single point Additional Comments: HIstory obtained from patient grandson.     Prior Function Level of Independence: Independent with assistive device(s)         Comments: Patient grandson states that patient was independent with ADL's and mobility and used a SPC occasionally for ambulating longer distances i.e. to the grocery store     Hand Dominance        Extremity/Trunk Assessment   Upper Extremity Assessment Upper Extremity Assessment: Difficult to assess due to impaired cognition    Lower Extremity Assessment Lower Extremity Assessment: Difficult to assess due to impaired cognition  Communication   Communication: Receptive difficulties;Expressive difficulties  Cognition Arousal/Alertness: Lethargic   Overall Cognitive Status: Impaired/Different from baseline Area of Impairment: Following commands                               General Comments: Patient with eyes closed through 95% of session and is nonverbal. Does not follow simple commands i.e. stick out tongue, open mouth. Responds to  noxious stimuli.       General Comments      Exercises     Assessment/Plan    PT Assessment Patient needs continued PT services  PT Problem List Decreased strength;Decreased range of motion;Decreased activity tolerance;Decreased balance;Decreased mobility;Decreased cognition;Pain       PT Treatment Interventions DME instruction;Stair training;Gait training;Functional mobility training;Therapeutic activities;Therapeutic exercise;Balance training;Neuromuscular re-education;Cognitive remediation;Patient/family education;Wheelchair mobility training    PT Goals (Current goals can be found in the Care Plan section)  Acute Rehab PT Goals Patient Stated Goal: Patient nonverbal    Frequency Min 2X/week   Barriers to discharge        Co-evaluation PT/OT/SLP Co-Evaluation/Treatment: Yes Reason for Co-Treatment: Complexity of the patient's impairments (multi-system involvement);Necessary to address cognition/behavior during functional activity;For patient/therapist safety;To address functional/ADL transfers PT goals addressed during session: Mobility/safety with mobility         AM-PAC PT "6 Clicks" Daily Activity  Outcome Measure Difficulty turning over in bed (including adjusting bedclothes, sheets and blankets)?: Unable Difficulty moving from lying on back to sitting on the side of the bed? : Unable Difficulty sitting down on and standing up from a chair with arms (e.g., wheelchair, bedside commode, etc,.)?: Unable Help needed moving to and from a bed to chair (including a wheelchair)?: Total Help needed walking in hospital room?: Total Help needed climbing 3-5 steps with a railing? : Total 6 Click Score: 6    End of Session     Patient left: in bed;with call bell/phone within reach Nurse Communication: Mobility status PT Visit Diagnosis: Hemiplegia and hemiparesis;Difficulty in walking, not elsewhere classified (R26.2);Other abnormalities of gait and mobility  (R26.89) Hemiplegia - Right/Left: Left    Time: 3893-7342 PT Time Calculation (min) (ACUTE ONLY): 21 min   Charges:   PT Evaluation $PT Eval High Complexity: 1 High     PT G Codes:       Ellamae Sia, PT, DPT Acute Rehabilitation Services  Pager: [place] 04/25/2017, 2:30 PM

## 2017-04-25 NOTE — Progress Notes (Signed)
Initial Nutrition Assessment  DOCUMENTATION CODES:   Not applicable  INTERVENTION:  Premier Protein BID, each supplement provides 160 calories and 30 grams of protein Recommend obtain new weight  NUTRITION DIAGNOSIS:   Inadequate oral intake related to social / environmental circumstances(insulin non-compliance) as evidenced by meal completion < 50%, per patient/family report.  GOAL:   Patient will meet greater than or equal to 90% of their needs   MONITOR:   PO intake, Supplement acceptance, Weight trends  REASON FOR ASSESSMENT:   Malnutrition Screening Tool    ASSESSMENT:   Caitlin Morris is a 67 y.o. female with medical history significant of diabetes, hypertension, hyperlipidemia, sick sinus syndrome history of CVA with residual left-sided weakness, CKD, presents with nausea, vomiting x6 days with occassional blurred vision and headaches. Non compliant with insulin, blood sugar was in 300s upon admit but labs do not indicate DKA  Spoke with patient and family at bedside. She started dealing with nausea and vomiting on Saturday, has started to resolve today. Was requesting ginger ale for stomach during visit, but feels she is getting better. Normally eats 3 good meals a day. UBW of 180 pounds. Appears current weight of 185 pounds is stated. NFPE WNL. Seems like she is ok from that perspective. Discussed with diabetes coordinator. A1c 11.3 upon admit.  Seems she eats well but has poor compliance with insulin. This seems to be an ongoing issue, was admitted with [facility] 05/20/2016. Was recommended insulin be titrated for euglycemia as an outpatient at that time as well.  Labs reviewed:  Na 134 CBGs 256, 306, 420  Medications reviewed and include:  NS at 87mL/hr Insulin  Diet Order:  Diet Carb Modified Fluid consistency: Thin; Room service appropriate? Yes Fall precautions Aspiration precautions  EDUCATION NEEDS:   Education needs have been addressed  Skin:  Skin  Assessment: Skin Integrity Issues: Skin Integrity Issues:: Stage II Stage II: to buttocks  Last BM:  04/24/2017  Height:   Ht Readings from Last 1 Encounters:  04/24/17 5\' 5"  (1.651 m)    Weight:   Wt Readings from Last 1 Encounters:  04/24/17 185 lb (83.9 kg)    Ideal Body Weight:  56.81 kg  BMI:  Body mass index is 30.79 kg/m.  Estimated Nutritional Needs:   Kcal:  1650-1800 calories  Protein:  95-105 grams  Fluid:  1.7-1.8L  Satira Anis. Hosanna Betley, MS, RD LDN Inpatient Clinical Dietitian Pager [phone]

## 2017-04-25 NOTE — Progress Notes (Addendum)
PROGRESS NOTE    [name]  QIW:[id number] DOB: [birth year] DOA: 04/24/2017 PCP: Benito Mccreedy, MD      Brief Narrative:  Caitlin Morris is a 67 y.o. F with DM, HTN, obesity, CKD III baseline Cr 1.4, hx SSS without pacer, and history of CVA who presents with 6 days recurrence of her chronic relapsing N/V, but now accompanied by headache, blurred vision, trouble remembering words and disorientation and then finally fixed lateral gaze.   Assessment & Plan:  Abnormal lateral conjugate gaze This is new.  MRI unable to be obtained this morning.  EEG pending.   -Neuro checks, NIHSS per protocol -Daily aspirin 325 mg -Obtain EEG -Obtain lipids -Obtain MRI brain   -Echocardiogram ordered -PT/OT/SLP consultation -Consult to Neurology, appreciate recommendations  Encephalopathy Diagnostic considerations include stroke, metabolic encephalopathy from PR ES, hypertensive encephalopathy , status epilepticus.  Doubt infection.   -Consult neurology -Obtain EEG -Restart amlodipine  ADDENDUM 5PM: Notified by Dr. Leonie Man that patient EEG shows status epilepticus.  Will load AEDs.  To remain on hospitalist service for now, but may need ICU transfer.  Hypertension Permissive hypertension today. -Continue statin -Start amlodipine  Diabetes with polyneuropathy -Continue Levemir -Continue SSI -Continue gabapentin  CKD stage III  Gout Quiescent -Continue allopurinol  Glaucoma -Continue eye drops  Anxiety Mood disorder -Continue clonazepam -Continue gabapentin -Continue sertraline       DVT prophylaxis: SCDs Code Status: FULL Family Communication: Grandson at bedside MDM and disposition Plan: The below labs and imaging reports were reviewed.  The patient's status is clinically worsening, she is more disoriented this morning.  WIll attempt to obtain MRI, continue work up for seizures, hypertensive encephalopathy in the meantime.     Consultants:    Neurology  Procedures:   CT head 4/1  MRI brain pending  Echo pending  US carotids pending  EEG pending  Antimicrobials:   None    Subjective: She is very sluggish this afternoon.  Mostly is not responding to my questions.  Nursing report she was too agitated to perform MRI.  Per nursing: no seizure activity.  No fever, vomtiing.  No cough, sputum.  No chest pain.    Objective: Vitals:   04/25/17 0045 04/25/17 0100 04/25/17 0400 04/25/17 0611  BP:  (!) 179/98 (!) 139/91 (!) 158/94  Pulse: 64 66 61 (!) 59  Resp: 17 14 19 16   Temp:    99.3 F (37.4 C)  TempSrc:    Oral  SpO2: 95% 93% 93% 97%  Weight:      Height:        Intake/Output Summary (Last 24 hours) at 04/25/2017 0803 Last data filed at 04/25/2017 0601 Gross per 24 hour  Intake 0 ml  Output -  Net 0 ml   Filed Weights   04/24/17 1714  Weight: 83.9 kg (185 lb)    Examination: General appearance: Obese female, sluggish, sleepy.  Answers some questions but mostly seems encephalopathic.   HEENT: Anicteric, conjunctiva pink, lids and lashes normal. PERRL.  No nasal deformity, discharge, epistaxis.  Lips moist, OP moist, no lesions in OP.   Skin: Warm and dry.   No suspicious rashes or lesions. Cardiac: RRR, nl S1-S2, no murmurs appreciated.   No LE edema.   Respiratory: Normal respiratory rate and rhythm.  CTAB without rales or wheezes. Abdomen: Abdomen soft. No obvious grimace to palpation, no guaridng. No ascites, distension, hepatosplenomegaly.   MSK: No deformities or effusions. Neuro: Sleepy, sluggish.  No lateral gaze, does not  follow all commands.  No GTC activity.  Psych: Attention abnormal. Answers that she came to the hospital because "I was sick" but otherwise does not answer many questions.  Opens mouth to command, lifts arm to command, but only intermittently following commands.     Data Reviewed: I have personally reviewed following labs and imaging studies:  CBC: Recent Labs  Lab  27-Apr-2017 1805  WBC 9.4  HGB 14.5  HCT 43.4  MCV 83.6  PLT 818   Basic Metabolic Panel: Recent Labs  Lab 2017-04-27 1730  NA 134*  K 3.6  CL 93*  CO2 26  GLUCOSE 390*  BUN 23*  CREATININE 1.42*  CALCIUM 9.8   GFR: Estimated Creatinine Clearance: 40.6 mL/min (A) (by C-G formula based on SCr of 1.42 mg/dL (H)). Liver Function Tests: Recent Labs  Lab Apr 27, 2017 1730  AST 22  ALT 14  ALKPHOS 131*  BILITOT 1.6*  PROT 8.1  ALBUMIN 3.9   Recent Labs  Lab 04/27/17 1730  LIPASE 33   No results for input(s): AMMONIA in the last 168 hours. Coagulation Profile: Recent Labs  Lab 04/27/2017 2014  INR 1.19   Cardiac Enzymes: Recent Labs  Lab 04/25/17 0000  TROPONINI 0.05*   BNP (last 3 results) No results for input(s): PROBNP in the last 8760 hours. HbA1C: Recent Labs    04/25/17 0635  HGBA1C 11.3*   CBG: Recent Labs  Lab 04/25/17 0429  GLUCAP 398*   Lipid Profile: Recent Labs    04/25/17 0635  CHOL 195  HDL 38*  LDLCALC 117*  TRIG 198*  CHOLHDL 5.1   Thyroid Function Tests: No results for input(s): TSH, T4TOTAL, FREET4, T3FREE, THYROIDAB in the last 72 hours. Anemia Panel: No results for input(s): VITAMINB12, FOLATE, FERRITIN, TIBC, IRON, RETICCTPCT in the last 72 hours. Urine analysis:    Component Value Date/Time   COLORURINE YELLOW 2017-04-27 1759   APPEARANCEUR CLEAR 2017-04-27 1759   LABSPEC 1.026 04/27/17 1759   PHURINE 6.0 27-Apr-2017 1759   GLUCOSEU >=500 (A) 2017-04-27 1759   HGBUR SMALL (A) 2017/04/27 1759   HGBUR negative 07/07/2009 0957   BILIRUBINUR NEGATIVE April 27, 2017 1759   KETONESUR NEGATIVE 04/27/17 1759   PROTEINUR >=300 (A) 04-27-2017 1759   UROBILINOGEN 1.0 10/01/2013 0915   NITRITE NEGATIVE 04/27/2017 1759   LEUKOCYTESUR SMALL (A) April 27, 2017 1759   Sepsis Labs: @LABRCNTIP (procalcitonin:4,lacticacidven:4)  )No results found for this or any previous visit (from the past 240 hour(s)).       Radiology  Studies: Dg Chest 2 View  Result Date: April 27, 2017 CLINICAL DATA:  Altered mental status EXAM: CHEST - 2 VIEW COMPARISON:  08/29/2015 FINDINGS: No acute pulmonary infiltrate or effusion. Mild cardiomegaly with aortic atherosclerosis. No pneumothorax. Degenerative changes of the spine. IMPRESSION: No active cardiopulmonary disease.  Mild cardiomegaly. Electronically Signed   By: Donavan Foil M.D.   On: 04/27/2017 20:34   Ct Head Wo Contrast  Addendum Date: 04/27/2017   ADDENDUM REPORT: 04-27-17 21:12 ADDENDUM: Correction to impression Impression #2 should read: Small vessel ischemic changes of the white matter with old appearing bilateral lacunar infarcts. Electronically Signed   By: Donavan Foil M.D.   On: April 27, 2017 21:12   Result Date: 27-Apr-2017 CLINICAL DATA:  Nausea vomiting headache and altered LOC EXAM: CT HEAD WITHOUT CONTRAST TECHNIQUE: Contiguous axial images were obtained from the base of the skull through the vertex without intravenous contrast. COMPARISON:  None. FINDINGS: Brain: No acute territorial infarction, hemorrhage or intracranial mass is visualized. Moderate small vessel  ischemic changes of the white matter. Old appearing lacunar infarcts in the right basal ganglia and left caudate. Probable old left posterior white matter injury with mild asymmetric enlargement of the left atria. Vascular: No hyperdense vessels.  Carotid vascular calcification. Skull: No fracture or suspicious lesion Sinuses/Orbits: No acute finding. Other: None IMPRESSION: 1. No definite CT evidence for acute intracranial abnormality. 2. Small vessel ischemic changes of the white matter and old appearing bilateral acute infarcts. Electronically Signed: By: Donavan Foil M.D. On: 04/24/2017 20:47        Scheduled Meds: . allopurinol  100 mg Oral BID  . aspirin  300 mg Rectal Daily   Or  . aspirin  325 mg Oral Daily  . brimonidine  1 drop Both Eyes BID   And  . timolol  1 drop Both Eyes BID  . clonazePAM   1 mg Oral QHS  . gabapentin  600 mg Oral BID  . insulin aspart  0-15 Units Subcutaneous TID WC  . insulin aspart  0-5 Units Subcutaneous QHS  . insulin detemir  25 Units Subcutaneous Q2200  . pravastatin  80 mg Oral QHS  . sertraline  100 mg Oral QHS   Continuous Infusions: . sodium chloride 75 mL/hr at 04/25/17 0601     LOS: 1 day    Time spent: 25 minutes    Edwin Dada, MD [facility] 04/25/2017, 3:03 PM     Pager [phone] --- please page though AMION:  [email or web address] If 7PM-7AM, please contact night-coverage

## 2017-04-25 NOTE — ED Notes (Signed)
ED TO INPATIENT HANDOFF REPORT  Name/Age/Gender Caitlin Morris 67 y.o. female  Code Status Code Status History    Date Active Date Inactive Code Status Order ID Comments User Context   05/20/2016 2101 05/23/2016 1504 Full Code [id number]  Reubin Milan, MD Inpatient   11/09/2015 1348 11/11/2015 1756 Full Code [id number]  Armandina Gemma, MD Inpatient   10/01/2013 1503 10/04/2013 1334 Full Code [id number]  Grier Mitts, PA-C Inpatient      Home/SNF/Other Home  Chief Complaint nausea and vomiting; blurry vision  Level of Care/Admitting Diagnosis ED Disposition    ED Disposition Condition [facility] Area: [place] [100100]  Level of Care: Telemetry [5]  Diagnosis: CVA (cerebral vascular accident) [facility]) [[id number]]  Admitting Physician: Toy Baker [[id number]]  Attending Physician: Toy Baker [[id number]]  Estimated length of stay: 3 - 4 days  Certification:: I certify this patient will need inpatient services for at least 2 midnights  PT Class (Do Not Modify): Inpatient [101]  PT Acc Code (Do Not Modify): Private [1]       Medical History Past Medical History:  Diagnosis Date  . Anxiety   . Arthritis    arthritis left foot and gout  . Cataract    not surgical yet  . Depression   . Diabetes mellitus without complication ([place])   . Glaucoma   . Hyperlipidemia   . Hypertension   . Obesity   . Stroke [facility])    left sided weakness remains-ambulates with cane.    Allergies Allergies  Allergen Reactions  . Codeine Hives and Swelling    IV Location/Drains/Wounds Patient Lines/Drains/Airways Status   Active Line/Drains/Airways    Name:   Placement date:   Placement time:   Site:   Days:   Peripheral IV 04/24/17 Right Forearm   04/24/17    2359    Forearm   1          Labs/Imaging Results for orders placed or performed during the hospital encounter of 04/24/17 (from the past 48 hour(s))  Lipase, blood     Status:  None   Collection Time: 04/24/17  5:30 PM  Result Value Ref Range   Lipase 33 11 - 51 U/L    Comment: Performed at [facility], [place] [address] [place], [place] [address]  Comprehensive metabolic panel     Status: Abnormal   Collection Time: 04/24/17  5:30 PM  Result Value Ref Range   Sodium 134 (L) 135 - 145 mmol/L   Potassium 3.6 3.5 - 5.1 mmol/L   Chloride 93 (L) 101 - 111 mmol/L   CO2 26 22 - 32 mmol/L   Glucose, Bld 390 (H) 65 - 99 mg/dL   BUN 23 (H) 6 - 20 mg/dL   Creatinine, Ser 1.42 (H) 0.44 - 1.00 mg/dL   Calcium 9.8 8.9 - 10.3 mg/dL   Total Protein 8.1 6.5 - 8.1 g/dL   Albumin 3.9 3.5 - 5.0 g/dL   AST 22 15 - 41 U/L   ALT 14 14 - 54 U/L   Alkaline Phosphatase 131 (H) 38 - 126 U/L   Total Bilirubin 1.6 (H) 0.3 - 1.2 mg/dL   GFR calc non Af Amer 37 (L) >60 mL/min   GFR calc Af Amer 43 (L) >60 mL/min    Comment: (NOTE) The eGFR has been calculated using the CKD EPI equation. This calculation has not been validated in all clinical situations. eGFR's persistently <60 mL/min signify  possible Chronic Kidney Disease.    Anion gap 15 5 - 15    Comment: Performed at [facility], [place] [address] [place], [place] [address]  Urinalysis, Routine w reflex microscopic     Status: Abnormal   Collection Time: 04/24/17  5:59 PM  Result Value Ref Range   Color, Urine YELLOW YELLOW   APPearance CLEAR CLEAR   Specific Gravity, Urine 1.026 1.005 - 1.030   pH 6.0 5.0 - 8.0   Glucose, UA >=500 (A) NEGATIVE mg/dL   Hgb urine dipstick SMALL (A) NEGATIVE   Bilirubin Urine NEGATIVE NEGATIVE   Ketones, ur NEGATIVE NEGATIVE mg/dL   Protein, ur >=300 (A) NEGATIVE mg/dL   Nitrite NEGATIVE NEGATIVE   Leukocytes, UA SMALL (A) NEGATIVE   RBC / HPF 6-30 0 - 5 RBC/hpf   WBC, UA 6-30 0 - 5 WBC/hpf   Bacteria, UA NONE SEEN NONE SEEN   Squamous Epithelial / LPF 0-5 (A) NONE SEEN    Comment: Performed at [facility], [place] [address],  [place], [place] [address]  CBC     Status: Abnormal   Collection Time: 04/24/17  6:05 PM  Result Value Ref Range   WBC 9.4 4.0 - 10.5 K/uL   RBC 5.19 (H) 3.87 - 5.11 MIL/uL   Hemoglobin 14.5 12.0 - 15.0 g/dL   HCT 43.4 36.0 - 46.0 %   MCV 83.6 78.0 - 100.0 fL   MCH 27.9 26.0 - 34.0 pg   MCHC 33.4 30.0 - 36.0 g/dL   RDW 14.1 11.5 - 15.5 %   Platelets 305 150 - 400 K/uL    Comment: Performed at [facility], [place] [address] [place], [place] [address]  APTT     Status: None   Collection Time: 04/24/17  8:14 PM  Result Value Ref Range   aPTT 28 24 - 36 seconds    Comment: Performed at [facility], [place] [address], [place], [place] [address]  Protime-INR     Status: None   Collection Time: 04/24/17  8:14 PM  Result Value Ref Range   Prothrombin Time 15.0 11.4 - 15.2 seconds   INR 1.19     Comment: Performed at [facility], [place] [address] [place], [place] [address]  Ethanol     Status: None   Collection Time: 04/24/17  8:14 PM  Result Value Ref Range   Alcohol, Ethyl (B) <10 <10 mg/dL    Comment:        LOWEST DETECTABLE LIMIT FOR SERUM ALCOHOL IS 10 mg/dL FOR MEDICAL PURPOSES ONLY Performed at [place] [address], [place],  [address]   Rapid urine drug screen (hospital performed)     Status: None   Collection Time: 04/24/17  8:14 PM  Result Value Ref Range   Opiates NONE DETECTED NONE DETECTED   Cocaine NONE DETECTED NONE DETECTED   Benzodiazepines NONE DETECTED NONE DETECTED   Amphetamines NONE DETECTED NONE DETECTED   Tetrahydrocannabinol NONE DETECTED NONE DETECTED   Barbiturates NONE DETECTED NONE DETECTED    Comment: (NOTE) DRUG SCREEN FOR MEDICAL PURPOSES ONLY.  IF CONFIRMATION IS NEEDED FOR ANY PURPOSE, NOTIFY LAB WITHIN 5 DAYS. LOWEST DETECTABLE LIMITS FOR URINE DRUG SCREEN Drug Class                     Cutoff (ng/mL) Amphetamine and metabolites    1000 Barbiturate and  metabolites    200  Benzodiazepine                 803 Tricyclics and metabolites     300 Opiates and metabolites        300 Cocaine and metabolites        300 THC                            50 Performed at [facility], [place] [address] [place], [place] [address]   Troponin I (q 6hr x 3)     Status: Abnormal   Collection Time: 04/25/17 12:00 AM  Result Value Ref Range   Troponin I 0.05 (HH) <0.03 ng/mL    Comment: CRITICAL RESULT CALLED TO, READ BACK BY AND VERIFIED WITH: Dolores Hoose @0050  04/24/17 MKELLY Performed at [facility], [place] [address], [place], [place] [address]    Dg Chest 2 View  Result Date: 04/24/2017 CLINICAL DATA:  Altered mental status EXAM: CHEST - 2 VIEW COMPARISON:  08/29/2015 FINDINGS: No acute pulmonary infiltrate or effusion. Mild cardiomegaly with aortic atherosclerosis. No pneumothorax. Degenerative changes of the spine. IMPRESSION: No active cardiopulmonary disease.  Mild cardiomegaly. Electronically Signed   By: Donavan Foil M.D.   On: 04/24/2017 20:34   Ct Head Wo Contrast  Addendum Date: 04/24/2017   ADDENDUM REPORT: 04/24/2017 21:12 ADDENDUM: Correction to impression Impression #2 should read: Small vessel ischemic changes of the white matter with old appearing bilateral lacunar infarcts. Electronically Signed   By: Donavan Foil M.D.   On: 04/24/2017 21:12   Result Date: 04/24/2017 CLINICAL DATA:  Nausea vomiting headache and altered LOC EXAM: CT HEAD WITHOUT CONTRAST TECHNIQUE: Contiguous axial images were obtained from the base of the skull through the vertex without intravenous contrast. COMPARISON:  None. FINDINGS: Brain: No acute territorial infarction, hemorrhage or intracranial mass is visualized. Moderate small vessel ischemic changes of the white matter. Old appearing lacunar infarcts in the right basal ganglia and left caudate. Probable old left posterior white matter injury with mild asymmetric enlargement of the  left atria. Vascular: No hyperdense vessels.  Carotid vascular calcification. Skull: No fracture or suspicious lesion Sinuses/Orbits: No acute finding. Other: None IMPRESSION: 1. No definite CT evidence for acute intracranial abnormality. 2. Small vessel ischemic changes of the white matter and old appearing bilateral acute infarcts. Electronically Signed: By: Donavan Foil M.D. On: 04/24/2017 20:47    Pending Labs Unresulted Labs (From admission, onward)   Start     Ordered   04/24/17 2314  Urine culture  STAT,   STAT     04/24/17 2313   04/24/17 2226  Troponin I (q 6hr x 3)  Now then every 6 hours,   R     04/24/17 2225   Signed and Held  Hemoglobin A1c  Tomorrow morning,   R     Signed and Held   Signed and Held  Lipid panel  Tomorrow morning,   R    Comments:  Fasting    Signed and Held   Signed and Held  Urinalysis, dipstick only  Once,   R    Comments:  On admission    Signed and Held      Vitals/Pain Today's Vitals   04/25/17 0000 04/25/17 0015 04/25/17 0045 04/25/17 0100  BP: (!) 170/101   (!) 179/98  Pulse: 68 64 64 66  Resp: 20 19 17 14   Temp:      TempSrc:  SpO2: 96% 96% 95% 93%  Weight:      Height:      PainSc:        Isolation Precautions No active isolations  Medications Medications  fluorescein ophthalmic strip 1 strip (1 strip Both Eyes Given 04/24/17 1912)  tetracaine (PONTOCAINE) 0.5 % ophthalmic solution 2 drop (2 drops Both Eyes Given 04/24/17 1912)    Mobility walks

## 2017-04-25 NOTE — ED Notes (Signed)
carelink leaving with patient

## 2017-04-25 NOTE — Procedures (Signed)
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   Sedation: None  Technique: This is a 21 channel routine scalp EEG performed at the bedside with bipolar and monopolar montages arranged in accordance to the international 10/20 system of electrode placement. One channel was dedicated to EKG recording.    Background: The background consists of intermixed alpha and beta activities. There is a well defined posterior dominant rhythm of 8 - 9 Hz that is seen bilaterally.   In addition, there is irregular slow activity maximal at P8 >O2 > P4.  There are frequent partial seizures arising from the same distribution.  Interictally there is some sharply contoured beta activity which occurs in 1-3-second runs in the same distribution.  Photic stimulation: Physiologic driving is not performed  EEG Abnormalities: 1) 3 partial seizures lasting 1.5-2 minutes arising from the right parietooccipital region  Clinical Interpretation: This EEG recorded multiple seizures arising from the right parieto-occipital region.    This was communicated with the neurologist seeing her today and she was continued on long-term monitoring.  Roland Rack, MD [facility] [phone]  If 7pm- 7am, please page neurology on call as listed in [place].

## 2017-04-25 NOTE — Consult Note (Signed)
Neurology Consultation  Reason for Consult: Blurred vision, altered mental status Referring Physician: Dr. Marin Roberts  CC: Blurred vision, confusion  History is obtained from: Patient's family at bedside, chart  HPI: Caitlin Morris is a 67 y.o. 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  According to history provided by family and chart review, patient had been complaining of blurred vision and was appearing confused.  Her speech was repetitive and she did not know where she was.  She did have some nausea and vomiting that continued for the past 5-6 days.  No fevers or chills.  No neck pain or neck stiffness.  Does not have diarrhea. Patient is noncompliant to her insulin.  On arrival, her sugar levels were found to be in high 300s.  Labs not suggestive of DKA.  Her creatinine was also elevated to 1.42. Patient denies any lateralized weakness more than usual-left-sided weakness is baseline. She denies any current headache but headache has been ongoing for the past few days per family. On examination in the emergency room, she was also noted to have a rightward gaze preference. Because of the multitude of symptoms above, a neurological consultation was placed. Patient said that she is on antidepressants and antianxiety medications which are keeping her anxiety and depression under good control and she has no suicidal or homicidal ideation. She does not abuse drugs , prescribed or illicit.   LKW: 0900 04/20/17 tpa given?: no,  Premorbid modified Rankin scale (mRS): 3  ROS: ROS was performed and is negative except as noted in the HPI.  Past Medical History:  Diagnosis  Date  . Anxiety   . Arthritis    arthritis left foot and gout  . Cataract    not surgical yet  . Depression   . Diabetes mellitus without complication ([place])   . Glaucoma   . Hyperlipidemia   . Hypertension   . Obesity   . Stroke [facility])    left sided weakness remains-ambulates with cane.    Family History  Problem Relation Age of Onset  . Diabetes Sister   . Diabetes Son   . Breast cancer Cousin    Social History:   reports that she quit smoking about 12 years ago. Her smoking use included cigarettes. She has a 30.00 pack-year smoking history. She has never used smokeless tobacco. She reports that she does not drink alcohol or use drugs. Patient denied using drugs or alcohol.  Medications No current facility-administered medications for this encounter.   Current Outpatient Medications:  .  allopurinol (ZYLOPRIM) 100 MG tablet, Take 100 mg by mouth 2 (two) times daily. , Disp: , Rfl:  .  amLODipine (NORVASC) 10 MG tablet, Take 10 mg by mouth daily., Disp: , Rfl:  .  clonazePAM (KLONOPIN) 1 MG tablet, Take 1 mg by mouth at bedtime. , Disp: , Rfl:  .  COMBIGAN 0.2-0.5 % ophthalmic solution, Place 1 drop into both eyes every 12 (twelve) hours. , Disp: , Rfl:  .  gabapentin (NEURONTIN) 600 MG tablet, Take 600 mg by mouth 2 (two) times daily., Disp: , Rfl:  .  insulin aspart (NOVOLOG) 100 UNIT/ML FlexPen, Inject 8 Units into the skin 3 (three) times daily with meals., Disp:  15 mL, Rfl: 0 .  LEVEMIR FLEXTOUCH 100 UNIT/ML Pen, INJECT 25 UNITS [place] QHS, Disp: , Rfl: 1 .  pravastatin (PRAVACHOL) 80 MG tablet, Take 80 mg by mouth at bedtime., Disp: , Rfl:  .  sertraline (ZOLOFT) 100 MG tablet, Take 100 mg by mouth at bedtime. , Disp: , Rfl:  .  glucose blood (ACCU-CHEK ACTIVE STRIPS) test strip, Use as instructed, Disp: 100 each, Rfl: 12 .  glucose monitoring kit (FREESTYLE) monitoring kit, 1 each by Does not apply route 4 (four) times daily - after meals and at bedtime. 1 month Diabetic  Testing Supplies for QAC-QHS accuchecks., Disp: 1 each, Rfl: 1 .  insulin glargine (LANTUS) 100 unit/mL SOPN, Inject 0.42 mLs (42 Units total) into the skin daily. (Patient not taking: Reported on 04/24/2017), Disp: 15 mL, Rfl: 0 .  Insulin Pen Needle 29G X 11HE MISC, 1 application by Does not apply route at bedtime., Disp: 100 each, Rfl: 0 .  Lancets (ACCU-CHEK SOFT TOUCH) lancets, Use as instructed, Disp: 100 each, Rfl: 12 .  tetrahydrozoline-zinc (VISINE-AC) 0.05-0.25 % ophthalmic solution, Place 2 drops into both eyes 3 (three) times daily as needed (DRY EYES)., Disp: , Rfl:   Exam: Current vital signs: BP (!) 179/98   Pulse 66   Temp 98.4 F (36.9 C) (Oral)   Resp 14   Ht _0  (1.651 m)   Wt 83.9 kg (185 lb)   LMP  (LMP Unknown)   SpO2 93%   BMI 30.79 kg/m  Vital signs in last 24 hours: Temp:  [98.4 F (36.9 C)] 98.4 F (36.9 C) (04/01 1714) Pulse Rate:  [55-81] 66 (04/02 0100) Resp:  [14-22] 14 (04/02 0100) BP: (152-184)/(98-114) 179/98 (04/02 0100) SpO2:  [92 %-99 %] 93 % (04/02 0100) Weight:  [83.9 kg (185 lb)] 83.9 kg (185 lb) (04/01 1714) General: Patient is awake, alert in no apparent distress. HEENT: Normocephalic and atraumatic, dry mucous membranes, no lymphadenopathy Lungs, clear to auscultation Cardiovascular: S1-S2 heard, regular rate rhythm Abdomen: Soft nondistended nontender Extremities: Warm well perfused Neurological exam Patient is awake, alert, oriented to self.  Not oriented to place or time. She has poor attention concentration. Her speech is clear.  She is able to follow simple commands. Due to the poor attention concentration, unable to get clear repetition. Cranial nerves: Pupils are 4 mm bilaterally with sluggish reaction, she does initially seem to have a rightward gaze preference but then her gaze moves to the left with a leftward gaze preference.  She does not follow commands consistently to check for extraocular movements but watching her over  the course of the encounter, she did not seem to have any restrictions of extraocular movements although she did prefer the right side.  Did not blink to threat from either side.  Face is symmetric.  Palate elevates symmetrically.  Tongue midline. Motor exam: Right upper and lower extremities 5/5.  Left upper extremity 4/5.  Left lower extremity 4/5 Sensory exam: Intact light touch with no extinction. Coordination: Intact finger-nose-finger DTRs blunted all over Gait exam deferred at this time NIH stroke scale 1a Level of Conscious.: 0 1b LOC Questions: 2 1c LOC Commands: 0 2 Best Gaze: 2 3 Visual: 0 4 Facial Palsy: 0 5a Motor Arm - left: 1 5b Motor Arm - Right: 0 6a Motor Leg - Left: 0 6b Motor Leg - Right: 0 7 Limb Ataxia: 0 8 Sensory: 0 9 Best Language: 0 10 Dysarthria: 0 11 Extinct. and Inatten.: 1 TOTAL:  6  Labs I have reviewed labs in epic and the results pertinent to this consultation are:  CBC    Component Value Date/Time   WBC 9.4 04/24/2017 1805   RBC 5.19 (H) 04/24/2017 1805   HGB 14.5 04/24/2017 1805   HCT 43.4 04/24/2017 1805   PLT 305 04/24/2017 1805   MCV 83.6 04/24/2017 1805   MCH 27.9 04/24/2017 1805   MCHC 33.4 04/24/2017 1805   RDW 14.1 04/24/2017 1805   LYMPHSABS 1.8 05/21/2016 0514   MONOABS 0.4 05/21/2016 0514   EOSABS 0.3 05/21/2016 0514   BASOSABS 0.0 05/21/2016 0514    CMP     Component Value Date/Time   NA 134 (L) 04/24/2017 1730   K 3.6 04/24/2017 1730   CL 93 (L) 04/24/2017 1730   CO2 26 04/24/2017 1730   GLUCOSE 390 (H) 04/24/2017 1730   BUN 23 (H) 04/24/2017 1730   CREATININE 1.42 (H) 04/24/2017 1730   CALCIUM 9.8 04/24/2017 1730   CALCIUM 11.0 (H) 09/25/2007 2155   PROT 8.1 04/24/2017 1730   ALBUMIN 3.9 04/24/2017 1730   AST 22 04/24/2017 1730   ALT 14 04/24/2017 1730   ALKPHOS 131 (H) 04/24/2017 1730   BILITOT 1.6 (H) 04/24/2017 1730   GFRNONAA 37 (L) 04/24/2017 1730   GFRAA 43 (L) 04/24/2017 1730  Urinalysis not  floridly suggestive of UTI but could have an underlying mild UTI.  Imaging I have reviewed the images obtained:  CT-scan of the brain -noncontrast CT head shows no acute changes.  Small vessel ischemic changes and possible bilateral chronic infarcts seen.  No MRI images from before.  Assessment:  67 year old woman with multiple cardiovascular and cerebrovascular risk factors presenting for evaluation of headaches, blurred vision, on examination noted to have right-sided gaze preference but my examination revealed that she has shifting gaze preference to the right and then to the left. Her exam has been very inconsistent to the best of my ability. Given her prior history of strokes and multiple risk factors, a brainstem stroke cannot be ruled out. She is not a candidate for IV TPA as her last known normal was many days ago. Noncontrast CT of the head not suggestive of a large evolving stroke. Also of note, she was hypertensive on arrival and hyperglycemic.  Impression: Evaluate for stroke-small vessel stroke in the brainstem versus large vessel stroke.  Other differentials could include CPM in the setting of fluid shifts related to fluctuating hypo-/hyperglycemia Stroke symptoms recrudescence in the setting of multiple toxic metabolic derangements Evaluate for hypertensive urgency  Recommendations: MRI of the brain without contrast MRA of the brain as well as bilateral carotid ultrasounds- avoid doing CTA given deranged renal function. Telemetry Frequent neuro checks Allow for permissive hypertension-given that hypertensive urgency is in the differentials, I would keep her systolic blood pressure goal between 140 and 180. 2D echo A1c Lipid panel PT/OT/speech therapy consults Treatment of toxic metabolic derangements per primary team as you are Aspirin 325 mg p.o. or 300 mg PR Atorvastatin 80 mg PO daily  Please page stroke NP/PA/MD (listed on AMION)  from 8am-4 pm as this patient will  be followed by the stroke team at this point. -- Amie Portland, MD [facility] Pager: [phone] If 7pm to 7am, please call on call as listed on AMION.

## 2017-04-25 NOTE — ED Notes (Signed)
[facility] en route for transport

## 2017-04-25 NOTE — Progress Notes (Signed)
vLTM EEG running. notified Neuro

## 2017-04-25 NOTE — Progress Notes (Addendum)
0700 Bedside shift report. Pt sleeping, easy to arouse, NAD, no complaints. Dgt at bedside. Fall precautions in place. WCTM.   0800 Pt assessed, confused to time. Reoriented. Pt denies pain, SOB. Left sided weakness noted. Fall precautions in place, WCTM.   0900 Pt down to MRI via bed.   0920 MRI tech called RN. Pt is dry heaving and gagging. Pt sent back up from MRI.   0940 Pt back up from MRI. Pt bathed, full linen changed, incontinent of urine. Purewick placed. Pt still c/o nausea. RN paged MD for orders.   1000 Pt medicated per MAR.   1740 Pt sleeping comfortably. Grandson at bedside, stated pt did not want to eat lunch. WCTM.   1400 Physical therapy called RN. Stated pt was confused, couldn't sit up on edge of bed without support.   1430 RN to room, pt sleeping, easy to arouse. Pt able to say name, DOB, and that she was in the hospital for being "sick". Dr. Loleta Books at bedside. Order for norvasc received. VSS, BS rechecked 256.  1450 Pt hard to arouse, grimaces and withdrawals to pain. Pt will open eyes, then shut them tightly. Pt pulls away from RN. Yolanda Bonine stated she is "playing with [place]". Pt opened mouth for norvasc and chewed pill. Ginger ale given and pt took small sip. Dr. Loleta Books paged for change in pt's status. WCTM.  1515 Rapid response RN [place] paged to look at pt.  [place], RN from rapid response here assessing pt. No concerns at this time. WCTM.   1630 EEG tech at bedside. Spoke to Dr. Leonel Ramsay regarding EEG, new orders received.   68 Dr. Leonie Man at bedside, orders received to give Ativan. Given per [facility]. WCTM.   1720 Loaded pt with Keppra per MAR. Pt still lethargic, EEG recording. Family at bedside.   1800 Paged MD about lab work.

## 2017-04-25 NOTE — Evaluation (Signed)
Occupational Therapy Evaluation Patient Details Name: Caitlin Morris MRN: [id number] DOB: April 11, 1950 Today's Date: 04/25/2017    History of Present Illness Pt. is a 67 y.o. F with significant PMH of diabetes, hypertension, hyperlipidemia, sick sinus syndrome, previous CVA with residual left sided weakness, and CKD. Presenting with 6 days of nausea/vomiting, occasional headaches, confusion and blurry vision.  CT negative for acute changes and ntoed small vessel ischemic changes of the white matter with old appearing bilateral lacunar infarcts.    Clinical Impression   Patient presenting with decreased I in all aspects of care, balance, functional mobility, safety,cognition, and aphasia. Patient's grandson present at reports pt mod I with cane PTA and living with two cousins. Patient currently required +2 total A for bed mobility and unable to follow commands. . Patient will benefit from acute OT to increase overall independence in the areas of ADLs, functional mobility,and cognition in order to safely discharge to next venue of care.    Follow Up Recommendations  SNF;Supervision/Assistance - 24 hour    Equipment Recommendations  Other (comment)(defer to next venue of care)    Recommendations for Other Services       Precautions / Restrictions Precautions Precautions: Fall Restrictions Weight Bearing Restrictions: No      Mobility Bed Mobility Overal bed mobility: Needs Assistance Bed Mobility: Supine to Sit;Sit to Supine     Supine to sit: Total assist;+2 for physical assistance     General bed mobility comments: Total assist + 2 for all bed mobility, with assisting BLE's off of bed and then elevating trunk up to sitting.   Transfers      General transfer comment: Not safe at this time.     Balance Overall balance assessment: Needs assistance Sitting-balance support: Bilateral upper extremity supported;Feet supported Sitting balance-Leahy Scale: Zero Sitting balance -  Comments: Total assist provided for sitting balance due to posterior lean and pt resistive to movement Postural control: Posterior lean       ADL either performed or assessed with clinical judgement   ADL Overall ADL's : Needs assistance/impaired     General ADL Comments: +2 total A for all self care this session as pt is lethargic and unable to follow commands.                  Pertinent Vitals/Pain Pain Assessment: Faces Faces Pain Scale: No hurt     Hand Dominance Right   Extremity/Trunk Assessment Upper Extremity Assessment Upper Extremity Assessment: Difficult to assess due to impaired cognition   Lower Extremity Assessment Lower Extremity Assessment: Defer to PT evaluation       Communication Communication Communication: Receptive difficulties;Expressive difficulties   Cognition Arousal/Alertness: Lethargic   Overall Cognitive Status: Impaired/Different from baseline Area of Impairment: Following commands       General Comments: Patient with eyes closed through 95% of session and is nonverbal. Does not follow simple commands i.e. stick out tongue, open mouth. Responds to noxious stimuli.              Home Living Family/patient expects to be discharged to:: Private residence Living Arrangements: Other relatives(2 cousins ) Available Help at Discharge: Family Type of Home: House Home Access: Stairs to enter Technical brewer of Steps: 3 Entrance Stairs-Rails: Right Home Layout: One level         Bathroom Toilet: Merrick - single point   Additional Comments: HIstory obtained from patient grandson present in room  Prior Functioning/Environment Level of Independence: Independent with assistive device(s)        Comments: Patient grandson states that patient was independent with ADL's and mobility and used a SPC occasionally for ambulating longer distances i.e. to the grocery store        OT Problem  List: Decreased strength;Impaired balance (sitting and/or standing);Decreased cognition;Pain;Decreased safety awareness;Decreased activity tolerance;Decreased coordination;Decreased knowledge of use of DME or AE;Impaired UE functional use      OT Treatment/Interventions: Self-care/ADL training;DME and/or AE instruction;Therapeutic activities;Balance training;Therapeutic exercise;Cognitive remediation/compensation;Neuromuscular education;Energy conservation;Patient/family education    OT Goals(Current goals can be found in the care plan section) Acute Rehab OT Goals Patient Stated Goal: Patient nonverbal ADL Goals Pt Will Perform Grooming: (P) with mod assist Pt Will Perform Upper Body Bathing: (P) with mod assist Pt Will Perform Lower Body Bathing: (P) with mod assist Pt Will Perform Upper Body Dressing: (P) with mod assist Pt Will Perform Lower Body Dressing: (P) with mod assist Pt Will Transfer to Toilet: (P) with mod assist Pt Will Perform Toileting - Clothing Manipulation and hygiene: (P) with mod assist  OT Frequency: Min 2X/week   Barriers to D/C: Decreased caregiver support          Co-evaluation PT/OT/SLP Co-Evaluation/Treatment: Yes Reason for Co-Treatment: Complexity of the patient's impairments (multi-system involvement);Necessary to address cognition/behavior during functional activity;For patient/therapist safety;To address functional/ADL transfers PT goals addressed during session: Mobility/safety with mobility OT goals addressed during session: ADL's and self-care;Other (comment)(functional mobility)      AM-PAC PT "6 Clicks" Daily Activity     Outcome Measure Help from another person eating meals?: Total Help from another person taking care of personal grooming?: Total Help from another person toileting, which includes using toliet, bedpan, or urinal?: Total Help from another person bathing (including washing, rinsing, drying)?: Total Help from another person to put  on and taking off regular upper body clothing?: Total Help from another person to put on and taking off regular lower body clothing?: Total 6 Click Score: 6   End of Session Nurse Communication: Mobility status  Activity Tolerance: Patient limited by lethargy Patient left: in bed;with bed alarm set  OT Visit Diagnosis: Unsteadiness on feet (R26.81);Repeated falls (R29.6);Muscle weakness (generalized) (M62.81)                Time: 4562-5638 OT Time Calculation (min): 24 min Charges:  OT Evaluation $OT Eval High Complexity: 1 High   Lyrick Worland P, MS, OTR/L, CBIS 04/25/2017, 3:31 PM

## 2017-04-25 NOTE — Progress Notes (Signed)
EEG completed, results pending Will be turned into vLTM EEG per Neuro findings

## 2017-04-26 ENCOUNTER — Inpatient Hospital Stay (HOSPITAL_COMMUNITY): Payer: Medicare Other

## 2017-04-26 DIAGNOSIS — E1165 Type 2 diabetes mellitus with hyperglycemia: Secondary | ICD-10-CM | POA: Diagnosis not present

## 2017-04-26 DIAGNOSIS — H510 Palsy (spasm) of conjugate gaze: Secondary | ICD-10-CM | POA: Diagnosis not present

## 2017-04-26 DIAGNOSIS — I10 Essential (primary) hypertension: Secondary | ICD-10-CM | POA: Diagnosis not present

## 2017-04-26 DIAGNOSIS — R4182 Altered mental status, unspecified: Secondary | ICD-10-CM | POA: Diagnosis not present

## 2017-04-26 DIAGNOSIS — E1140 Type 2 diabetes mellitus with diabetic neuropathy, unspecified: Secondary | ICD-10-CM | POA: Diagnosis not present

## 2017-04-26 DIAGNOSIS — R569 Unspecified convulsions: Secondary | ICD-10-CM | POA: Diagnosis not present

## 2017-04-26 DIAGNOSIS — G40901 Epilepsy, unspecified, not intractable, with status epilepticus: Secondary | ICD-10-CM | POA: Diagnosis not present

## 2017-04-26 DIAGNOSIS — G9340 Encephalopathy, unspecified: Secondary | ICD-10-CM | POA: Diagnosis not present

## 2017-04-26 DIAGNOSIS — N183 Chronic kidney disease, stage 3 (moderate): Secondary | ICD-10-CM | POA: Diagnosis not present

## 2017-04-26 DIAGNOSIS — Z8673 Personal history of transient ischemic attack (TIA), and cerebral infarction without residual deficits: Secondary | ICD-10-CM | POA: Diagnosis not present

## 2017-04-26 LAB — GLUCOSE, CAPILLARY
GLUCOSE-CAPILLARY: 164 mg/dL — AB (ref 65–99)
GLUCOSE-CAPILLARY: 203 mg/dL — AB (ref 65–99)
GLUCOSE-CAPILLARY: 268 mg/dL — AB (ref 65–99)
GLUCOSE-CAPILLARY: 229 mg/dL — AB (ref 65–99)
Glucose-Capillary: 188 mg/dL — ABNORMAL HIGH (ref 65–99)

## 2017-04-26 LAB — BASIC METABOLIC PANEL
ANION GAP: 11 (ref 5–15)
BUN: 22 mg/dL — ABNORMAL HIGH (ref 6–20)
CALCIUM: 8.7 mg/dL — AB (ref 8.9–10.3)
CHLORIDE: 102 mmol/L (ref 101–111)
CO2: 26 mmol/L (ref 22–32)
CREATININE: 1.48 mg/dL — AB (ref 0.44–1.00)
GFR calc non Af Amer: 35 mL/min — ABNORMAL LOW (ref >60)
GFR, EST AFRICAN AMERICAN: 41 mL/min — AB (ref >60)
Glucose, Bld: 201 mg/dL — ABNORMAL HIGH (ref 65–99)
Potassium: 2.9 mmol/L — ABNORMAL LOW (ref 3.5–5.1)
SODIUM: 139 mmol/L (ref 135–145)

## 2017-04-26 LAB — PHENYTOIN LEVEL, TOTAL: Phenytoin Lvl: 14 ug/mL (ref 10.0–20.0)

## 2017-04-26 LAB — CBC
HCT: 42.6 % (ref 36.0–46.0)
HEMOGLOBIN: 13.5 g/dL (ref 12.0–15.0)
MCH: 27.2 pg (ref 26.0–34.0)
MCHC: 31.7 g/dL (ref 30.0–36.0)
MCV: 85.9 fL (ref 78.0–100.0)
PLATELETS: 254 10*3/uL (ref 150–400)
RBC: 4.96 MIL/uL (ref 3.87–5.11)
RDW: 14.9 % (ref 11.5–15.5)
WBC: 8.2 10*3/uL (ref 4.0–10.5)

## 2017-04-26 LAB — URINE CULTURE

## 2017-04-26 MED ORDER — SODIUM CHLORIDE 0.9 % IV SOLN
100.0000 mg | Freq: Two times a day (BID) | INTRAVENOUS | Status: DC
  Administered 2017-04-26: 100 mg via INTRAVENOUS
  Filled 2017-04-26 (×2): qty 10

## 2017-04-26 MED ORDER — SODIUM CHLORIDE 0.9 % IV SOLN
150.0000 mg | Freq: Two times a day (BID) | INTRAVENOUS | Status: DC
  Filled 2017-04-26: qty 15

## 2017-04-26 MED ORDER — LEVETIRACETAM IN NACL 1000 MG/100ML IV SOLN
1000.0000 mg | Freq: Two times a day (BID) | INTRAVENOUS | Status: DC
  Administered 2017-04-26 – 2017-04-29 (×6): 1000 mg via INTRAVENOUS
  Filled 2017-04-26 (×6): qty 100

## 2017-04-26 MED ORDER — SODIUM CHLORIDE 0.9 % IV SOLN
150.0000 mg | Freq: Two times a day (BID) | INTRAVENOUS | Status: DC
  Administered 2017-04-27 – 2017-04-28 (×4): 150 mg via INTRAVENOUS
  Filled 2017-04-26 (×7): qty 15

## 2017-04-26 MED ORDER — PHENYTOIN SODIUM 50 MG/ML IJ SOLN
100.0000 mg | Freq: Three times a day (TID) | INTRAMUSCULAR | Status: DC
Start: 2017-04-26 — End: 2017-04-29
  Administered 2017-04-26 – 2017-04-29 (×10): 100 mg via INTRAVENOUS
  Filled 2017-04-26 (×10): qty 2

## 2017-04-26 MED ORDER — LEVETIRACETAM IN NACL 500 MG/100ML IV SOLN: 500.0000 mg | Freq: Once | INTRAVENOUS | Status: DC

## 2017-04-26 MED ORDER — SODIUM CHLORIDE 0.9 % IV SOLN
100.0000 mg | Freq: Once | INTRAVENOUS | Status: AC
  Administered 2017-04-27: 100 mg via INTRAVENOUS
  Filled 2017-04-26: qty 10

## 2017-04-26 NOTE — Procedures (Signed)
  Electroencephalogram report- LTM   Data acquisition: 10-20 electrode placement.  Additional T1, T2, and EKG electrodes; 26 channel digital referential acquisition reformatted to 18 channel/7 channel coronal bipolar     Spike detection: ON     Seizure detection: ON   Beginning time: 04/25/2017 at 1623 Ending time: 04/26/2017 at 7:05 AM  CPT: 95951 Day of study: Day 1   This 16  hours of intensive EEG monitoring with simultaneous video monitoring was performed for this patient with spells suspicious for seizures to rule out clinical and subclinical electrographic seizures.   Medications: As per EMR  Background activity is marked by mild background activity slowing predominantly in the theta range.  Superimposed right parieto-occipital interictal epileptiform discharges present maximum negativity right occipital cortex and form of fast's bike activities as well as 4-5 cps spike and wave discharges maximum negativity at the right parieto-occipital cortex.  In addition electrographic seizures present fairly frequently every 15-20 minutes without any clinical accompaniment.  Electrographic seizures marked by buildup and evolving spike and wave discharges across the right posterior cortex maximum negativity in the right occipital region with involution in the frequency morphology amplitude and negative field extending into the left occipital cortex as well.  Last electrographic seizure around 4 AM.  Clinical interpretation: This 16 hours of intensive EEG monitoring with simultaneous video monitoring is abnormal due to fairly frequent electrographic seizures arising from the right parieto-occipital cortex as discussed above.  Interictal discharges also present in the right occipital region throughout the recording.  Clinical correlation is advised.  These findings were communicated with ordering team at the time of interpretation

## 2017-04-26 NOTE — Progress Notes (Signed)
PROGRESS NOTE  Caitlin Morris YIF:[id number] DOB: October 31, 1950 DOA: 04/24/2017 PCP: Benito Mccreedy, MD   LOS: 2 days   Brief Narrative / Interim history: 67 year old female with diabetes, hypertension, obesity, chronic kidney disease stage III with baseline creatinine 1.4, sick sinus syndrome currently with no pacer, history of CVA who presents with headache, vision loss, and confusion.  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  Currently undergoing continuous EEG, MRI is pending and hopefully will obtain MRI after EEG is done -Continue aspirin -2D echo, lipids, PT/OT to complete CVA workup  Acute encephalopathy in the setting of status epilepticus -EEG on 4/2 showed status epilepticus, patient was given Keppra, Dilantin, Vimpat, now monitoring on continuous EEG, results pending, low threshold for ICU transfer -Discussed with Dr. Leonel Ramsay with neurology this morning  Hypertension -Continue Norvasc  Diabetes with polyneuropathy -Continue Levemir and sliding scale, CBG this morning 164, continue gabapentin for neuropathy  CKD stage III -Creatinine stable, 1.48 this morning, continue to monitor  Gout -quiescent, continue allopurinol  Glaucoma -Continue eye drops  Anxiety Mood disorder -Keep on home regimen    DVT prophylaxis: SCDs Code Status: Full code Family Communication: Discussed with daughter at bedside Disposition Plan: Home when ready  Consultants:   Neurology  Procedures:   EEG EEG Abnormalities: 1) 3 partial  seizures lasting 1.5-2 minutes arising from the right parietooccipital region Clinical Interpretation: This EEG recorded multiple seizures arising from the right parieto-occipital region.   This was communicated with the neurologist seeing her today and she was continued on long-term monitoring.  Antimicrobials:  None    Subjective: - no chest pain, shortness of breath, no abdominal pain, nausea or vomiting.  She is feeling well, mildly confused and thinks it is 1990 otherwise she knows where she is and why she is here  Objective: Vitals:   04/25/17 2120 04/26/17 0536 04/26/17 0851 04/26/17 1227  BP: (!) 143/76 (!) 148/89 (!) 160/85 (!) 162/92  Pulse: (!) 53 63 62 (!) 53  Resp: 20 18 14 16   Temp: 98.6 F (37 C)  99 F (37.2 C) 97.9 F (36.6 C)  TempSrc: Oral  Oral Oral  SpO2: 98% 96% 100% 99%  Weight:      Height:        Intake/Output Summary (Last 24 hours) at 04/26/2017 1340 Last data filed at 04/26/2017 0900 Gross per 24 hour  Intake 920 ml  Output 100 ml  Net 820 ml   Filed Weights   04/24/17 1714  Weight: 83.9 kg (185 lb)    Examination:  Constitutional: NAD Eyes:  lids and conjunctivae normal, PERRL, EOMI ENMT: Mucous membranes are moist.  Neck: normal, supple Respiratory: clear to auscultation bilaterally, no wheezing, no crackles. Normal respiratory effort. No accessory muscle use.  Cardiovascular: Regular rate and rhythm, no murmurs / rubs / gallops. No LE edema. 2+ pedal pulses. No carotid bruits.  Abdomen: no tenderness. Bowel sounds positive.  Musculoskeletal: no clubbing / cyanosis..  Skin: no rashes, lesions, ulcers. No induration Neurologic: CN 2-12 grossly intact. Strength 5/5 in all 4.  Psychiatric: Normal  judgment and insight. Alert and oriented x 2, not to time. Normal mood.    Data Reviewed: I have independently reviewed following labs and imaging studies  Chest x-ray 4/1, no infiltrates CT scan of the brain 4/1, no acute changes  CBC: Recent  Labs  Lab 04/24/17 1805 04/26/17 1042  WBC 9.4 8.2  HGB 14.5 13.5  HCT 43.4 42.6  MCV 83.6 85.9  PLT 305 562   Basic Metabolic Panel: Recent Labs  Lab 04/24/17 1730 04/25/17 1528 04/26/17 1042  NA 134* 139 139  K 3.6 3.1* 2.9*  CL 93* 98* 102  CO2 26 27 26   GLUCOSE 390* 203* 201*  BUN 23* 24* 22*  CREATININE 1.42* 1.53* 1.48*  CALCIUM 9.8 9.4 8.7*   GFR: Estimated Creatinine Clearance: 38.9 mL/min (A) (by C-G formula based on SCr of 1.48 mg/dL (H)). Liver Function Tests: Recent Labs  Lab 04/24/17 1730  AST 22  ALT 14  ALKPHOS 131*  BILITOT 1.6*  PROT 8.1  ALBUMIN 3.9   Recent Labs  Lab 04/24/17 1730  LIPASE 33   No results for input(s): AMMONIA in the last 168 hours. Coagulation Profile: Recent Labs  Lab 04/24/17 2014  INR 1.19   Cardiac Enzymes: Recent Labs  Lab 04/25/17 0000 04/25/17 1528  TROPONINI 0.05* 0.06*   BNP (last 3 results) No results for input(s): PROBNP in the last 8760 hours. HbA1C: Recent Labs    04/25/17 0635  HGBA1C 11.3*   CBG: Recent Labs  Lab 04/25/17 1413 04/25/17 1643 04/25/17 2121 04/26/17 0748 04/26/17 1138  GLUCAP 256* 188* 204* 164* 203*   Lipid Profile: Recent Labs    04/25/17 0635  CHOL 195  HDL 38*  LDLCALC 117*  TRIG 198*  CHOLHDL 5.1   Thyroid Function Tests: No results for input(s): TSH, T4TOTAL, FREET4, T3FREE, THYROIDAB in the last 72 hours. Anemia Panel: No results for input(s): VITAMINB12, FOLATE, FERRITIN, TIBC, IRON, RETICCTPCT in the last 72 hours. Urine analysis:    Component Value Date/Time   COLORURINE YELLOW 04/24/2017 1759   APPEARANCEUR CLEAR 04/24/2017 1759   LABSPEC 1.026 04/24/2017 1759   PHURINE 6.0 04/24/2017 1759   GLUCOSEU >=500 (A) 04/24/2017 1759   HGBUR SMALL (A) 04/24/2017 1759   HGBUR negative 07/07/2009 0957   BILIRUBINUR NEGATIVE 04/24/2017 1759   KETONESUR NEGATIVE 04/24/2017 1759   PROTEINUR >=300 (A) 04/24/2017 1759   UROBILINOGEN 1.0 10/01/2013 0915    NITRITE NEGATIVE 04/24/2017 1759   LEUKOCYTESUR SMALL (A) 04/24/2017 1759   Sepsis Labs: Invalid input(s): PROCALCITONIN, LACTICIDVEN  Recent Results (from the past 240 hour(s))  Urine culture     Status: Abnormal   Collection Time: 04/24/17 11:14 PM  Result Value Ref Range Status   Specimen Description   Final    URINE, RANDOM Performed at [facility] [address] [place], [place] [address]    Special Requests   Final    NONE Performed at [facility], [place] [address], [place], [place] [address]    Culture MULTIPLE SPECIES PRESENT, SUGGEST RECOLLECTION (A)  Final   Report Status 04/26/2017 FINAL  Final      Radiology Studies: Dg Chest 2 View  Result Date: 04/24/2017 CLINICAL DATA:  Altered mental status EXAM: CHEST - 2 VIEW COMPARISON:  08/29/2015 FINDINGS: No acute pulmonary infiltrate or effusion. Mild cardiomegaly with aortic atherosclerosis. No pneumothorax. Degenerative changes of the spine. IMPRESSION: No active cardiopulmonary disease.  Mild cardiomegaly. Electronically Signed   By: Donavan Foil  M.D.   On: 04/24/2017 20:34   Ct Head Wo Contrast  Addendum Date: 04/24/2017   ADDENDUM REPORT: 04/24/2017 21:12 ADDENDUM: Correction to impression Impression #2 should read: Small vessel ischemic changes of the white matter with old appearing bilateral lacunar infarcts. Electronically Signed   By: Donavan Foil M.D.   On: 04/24/2017 21:12   Result Date: 04/24/2017 CLINICAL DATA:  Nausea vomiting headache and altered LOC EXAM: CT HEAD WITHOUT CONTRAST TECHNIQUE: Contiguous axial images were obtained from the base of the skull through the vertex without intravenous contrast. COMPARISON:  None. FINDINGS: Brain: No acute territorial infarction, hemorrhage or intracranial mass is visualized. Moderate small vessel ischemic changes of the white matter. Old appearing lacunar infarcts in the right basal ganglia and left caudate. Probable old left  posterior white matter injury with mild asymmetric enlargement of the left atria. Vascular: No hyperdense vessels.  Carotid vascular calcification. Skull: No fracture or suspicious lesion Sinuses/Orbits: No acute finding. Other: None IMPRESSION: 1. No definite CT evidence for acute intracranial abnormality. 2. Small vessel ischemic changes of the white matter and old appearing bilateral acute infarcts. Electronically Signed: By: Donavan Foil M.D. On: 04/24/2017 20:47     Scheduled Meds: . allopurinol  100 mg Oral BID  . amLODipine  10 mg Oral Daily  . aspirin  300 mg Rectal Daily   Or  . aspirin  325 mg Oral Daily  . brimonidine  1 drop Both Eyes BID   And  . timolol  1 drop Both Eyes BID  . clonazePAM  1 mg Oral QHS  . gabapentin  600 mg Oral BID  . insulin aspart  0-15 Units Subcutaneous TID WC  . insulin aspart  0-5 Units Subcutaneous QHS  . insulin detemir  25 Units Subcutaneous Q2200  . phenytoin (DILANTIN) IV  100 mg Intravenous Q8H  . pravastatin  80 mg Oral QHS  . sertraline  100 mg Oral QHS   Continuous Infusions: . sodium chloride 125 mL/hr at 04/26/17 0732  . lacosamide (VIMPAT) IV Stopped (04/26/17 1235)  . levETIRAcetam     Marzetta Board, MD, PhD [facility] Pager [phone]  If 7PM-7AM, please contact night-coverage [email or web address] Password [email or web address] 04/26/2017, 1:40 PM

## 2017-04-26 NOTE — Evaluation (Signed)
Speech Language Pathology Evaluation Patient Details Name: Caitlin Morris MRN: [id number] DOB: 06-Mar-1950 Today's Date: 04/26/2017 Time: 5643-3295 SLP Time Calculation (min) (ACUTE ONLY): 16 min  Problem List:  Patient Active Problem List   Diagnosis Date Noted  . Stage II pressure ulcer of buttock 04/25/2017  . Acute encephalopathy 04/24/2017  . Abnormal lateral conjugate gaze 04/24/2017  . CVA (cerebral vascular accident) ([place]) 04/24/2017  . Hyperosmolar non-ketotic state in patient with type 2 diabetes mellitus ([place]) 05/20/2016  . Hyperlipidemia 05/20/2016  . Stroke ([place]) 05/20/2016  . Arthritis 05/20/2016  . AKI (acute kidney injury) ([place]) 05/20/2016  . Glaucoma 05/20/2016  . Type 2 diabetes mellitus, uncontrolled, with neuropathy ([place]) 11/10/2015  . Polyneuropathy due to type 2 diabetes mellitus ([place]) 11/10/2015  . Essential hypertension 11/10/2015  . Bradycardia 11/10/2015  . Depression with anxiety 11/10/2015  . CKD (chronic kidney disease) stage 3, GFR 30-59 ml/min (HCC) 11/10/2015  . Diabetic nephropathy ([place]) 11/10/2015  . Uncontrolled diabetes mellitus ([place]) 11/10/2015  . Arrhythmia 11/09/2015  . Cardiac arrhythmia 11/09/2015  . Hyperparathyroidism, primary ([place]) 11/08/2015  . Avulsion fracture of ankle 10/15/2015  . Osteoarthritis of left knee 10/01/2013  . DENTAL PAIN 10/16/2009  . VAGINAL DISCHARGE 07/07/2009  . ARTHRALGIA 07/07/2009  . RENAL INSUFFICIENCY 06/21/2009  . DEGENERATIVE JOINT DISEASE, LEFT KNEE 05/21/2009  . KNEE PAIN, LEFT 05/05/2009  . WEIGHT GAIN 05/05/2009  . PROTEINURIA 05/05/2009  . BIPOLAR DISORDER UNSPECIFIED 04/04/2008  . HYPERTENSION 10/31/2007  . OSTEOPENIA 10/24/2005  . Hypercalcemia 01/24/2005   Past Medical History:  Past Medical History:  Diagnosis Date  . Anxiety   . Arthritis    arthritis left foot and gout  . Cataract    not surgical yet  . Depression   . Diabetes mellitus without complication ([place])   . Glaucoma   .  Hyperlipidemia   . Hypertension   . Obesity   . Stroke [facility])    left sided weakness remains-ambulates with cane.   Past Surgical History:  Past Surgical History:  Procedure Laterality Date  . JOINT REPLACEMENT     Left TKA  . PARATHYROIDECTOMY Left 12/31/2015   Procedure: LEFT MINIMALLY INVASIVE PARATHYROIDECTOMY;  Surgeon: Armandina Gemma, MD;  Location: [facility];  Service: General;  Laterality: Left;  . TOTAL KNEE ARTHROPLASTY Left 10/01/2013   Procedure: TOTAL KNEE ARTHROPLASTY;  Surgeon: Nita Sells, MD;  Location: [facility];  Service: Orthopedics;  Laterality: Left;  Left total knee arthroplasty  . TUBAL LIGATION     HPI:  Pt. is a 67 y.o. F presenting with 6 days of N/V, occasional headaches, confusion, an blurry vision. CT negative for acute changes; MRI pending. EEG showed frequent, recurrent R occipital lobe seizures. PMH is significant for: DM, HTN, HLD, prior CVA with residual L sided weakness, CKD, depression, and anxiety.   Assessment / Plan / Recommendation Clinical Impression  Pt presents with cognitive impairments most significant in the areas of sustained attention, working memory, and storage of new information. She is oriented to person and knows that she came in for visual changes, but she is disoriented to time and location. She needs Mod-Max cues for topic maintenance, completion of one-step commands, and simple verbal problem solving. Per MD notes and family reports the pt appears to be improving with medical management for seizures. At this point, pt would require 24/7 supervision and SLP f/u at discharge. Will continue to follow acutely for continued differential diagnosis of abilities.     SLP Assessment  SLP  Recommendation/Assessment: Patient needs continued Speech Lanaguage Pathology Services SLP Visit Diagnosis: Cognitive communication deficit (R41.841)    Follow Up Recommendations  Skilled Nursing facility    Frequency and Duration min 2x/week  2 weeks       SLP Evaluation Cognition  Overall Cognitive Status: Impaired/Different from baseline Arousal/Alertness: Awake/alert Orientation Level: Oriented to person;Disoriented to place;Disoriented to time;Oriented to situation Attention: Sustained Sustained Attention: Impaired Sustained Attention Impairment: Verbal basic Memory: Impaired Memory Impairment: Storage deficit Awareness: Impaired Awareness Impairment: Intellectual impairment;Emergent impairment Problem Solving: Impaired Problem Solving Impairment: Verbal basic Safety/Judgment: Impaired       Comprehension  Auditory Comprehension Overall Auditory Comprehension: Impaired Commands: Impaired One Step Basic Commands: 50-74% accurate Conversation: Simple Interfering Components: Attention;Processing speed;Working Marine scientist    Expression Expression Primary Mode of Expression: Verbal Verbal Expression Overall Verbal Expression: Impaired Initiation: No impairment Level of Generative/Spontaneous Verbalization: Phrase Pragmatics: Impairment Impairments: Topic maintenance Interfering Components: Attention Non-Verbal Means of Communication: Not applicable   Oral / Motor  Motor Speech Overall Motor Speech: Appears within functional limits for tasks assessed   GO                    Germain Osgood 04/26/2017, 4:16 PM  Germain Osgood, M.A. CCC-SLP [phone]

## 2017-04-26 NOTE — Progress Notes (Addendum)
Subjective: Patient states that today her vision is slightly better.  She is able to see the shadow of me.  Her granddaughter also noticed that when she came in the room she was able to track her in the room.  She still is unable to count my fingers but gases fairly well.  Denies any other issues.  Denies headache.  Currently hooked up to LTM.  Denies any side effects from the Dilantin, Keppra, Vimpat.  No clinical seizures noted on follow-up  Exam: Vitals:   04/26/17 0536 04/26/17 0851  BP: (!) 148/89 (!) 160/85  Pulse: 63 62  Resp: 18 14  Temp:  99 F (37.2 C)  SpO2: 96% 100%    Physical Exam   HEENT-  Normocephalic, no lesions, without obvious abnormality.  Normal external eye and conjunctiva.   Cardiovascular- S1-S2 audible, pulses palpable throughout   Lungs-no rhonchi or wheezing noted, no excessive working breathing.  Saturations within normal limits Abdomen- All 4 quadrants palpated and nontender Extremities- Warm, dry and intact Musculoskeletal-no joint tenderness, deformity or swelling Skin-warm and dry, no hyperpigmentation, vitiligo, or suspicious lesions    Neuro:  Mental Status: Alert, oriented, thought content appropriate.  Speech fluent without evidence of aphasia.  Able to follow 3 step commands without difficulty. Cranial Nerves: II: Rarely looks at me and states that she can see my shadows and follows my hand.  When attempting to have her count my fingers initially she was correct but as I continued to ask her it was obvious she was guessing. III,IV, VI: ptosis not present, extra-ocular motions intact bilaterally--I did notice standing on the right side of the bed, when she was looking to the far right she had nystagmus but did not have nystagmus with the far left gaze.  Pupils equal bilaterally 4 mm, right is brisk and left is sluggish but has no APD,  V,VII: smile symmetric, facial light touch sensation normal bilaterally VIII: hearing normal bilaterally IX,X:  uvula rises symmetrically XI: bilateral shoulder shrug XII: midline tongue extension Motor: Right : Upper extremity   5/5    Left:     Upper extremity   5/5  Lower extremity   5/5     Lower extremity   5/5 Tone and bulk:normal tone throughout; no atrophy noted Sensory: Pinprick and light touch intact throughout, bilaterally Deep Tendon Reflexes: 2+ and symmetric throughout Plantars: Right: downgoing   Left: downgoing Cerebellar: No dysmetria noted Gait: Not tested    Medications:  Scheduled: . allopurinol  100 mg Oral BID  . amLODipine  10 mg Oral Daily  . aspirin  300 mg Rectal Daily   Or  . aspirin  325 mg Oral Daily  . brimonidine  1 drop Both Eyes BID   And  . timolol  1 drop Both Eyes BID  . clonazePAM  1 mg Oral QHS  . gabapentin  600 mg Oral BID  . insulin aspart  0-15 Units Subcutaneous TID WC  . insulin aspart  0-5 Units Subcutaneous QHS  . insulin detemir  25 Units Subcutaneous Q2200  . phenytoin (DILANTIN) IV  100 mg Intravenous Q8H  . pravastatin  80 mg Oral QHS  . sertraline  100 mg Oral QHS   Continuous: . sodium chloride 125 mL/hr at 04/26/17 0732  . lacosamide (VIMPAT) IV    . levETIRAcetam     HYQ:[id number] **OR** acetaminophen (TYLENOL) oral liquid 160 mg/5 mL **OR** acetaminophen, ondansetron (ZOFRAN) IV, senna-docusate  Pertinent Labs/Diagnostics: MRI/MRA of brain pending and  plan to be done at 5 PM tonight  EEG read yesterday showed:3 partial seizures lasting 1.5-2 minutes arising from the right parietooccipital region--since then has been placed on Dilantin and Vimpat     Dg Chest 2 View  Result Date: 04/24/2017 CLINICAL DATA:  Altered mental status EXAM: CHEST - 2 VIEW COMPARISON:  08/29/2015 FINDINGS: No acute pulmonary infiltrate or effusion. Mild cardiomegaly with aortic atherosclerosis. No pneumothorax. Degenerative changes of the spine. IMPRESSION: No active cardiopulmonary disease.  Mild cardiomegaly. Electronically Signed   By: Donavan Foil M.D.   On: 04/24/2017 20:34   Ct Head Wo Contrast  Addendum Date: 04/24/2017   ADDENDUM REPORT: 04/24/2017 21:12 ADDENDUM: Correction to impression Impression #2 should read: Small vessel ischemic changes of the white matter with old appearing bilateral lacunar infarcts. Electronically Signed   By: Donavan Foil M.D.   On: 04/24/2017 21:12   Result Date: 04/24/2017 CLINICAL DATA:  Nausea vomiting headache and altered LOC EXAM: CT HEAD WITHOUT CONTRAST TECHNIQUE: Contiguous axial images were obtained from the base of the skull through the vertex without intravenous contrast. COMPARISON:  None. FINDINGS: Brain: No acute territorial infarction, hemorrhage or intracranial mass is visualized. Moderate small vessel ischemic changes of the white matter. Old appearing lacunar infarcts in the right basal ganglia and left caudate. Probable old left posterior white matter injury with mild asymmetric enlargement of the left atria. Vascular: No hyperdense vessels.  Carotid vascular calcification. Skull: No fracture or suspicious lesion Sinuses/Orbits: No acute finding. Other: None IMPRESSION: 1. No definite CT evidence for acute intracranial abnormality. 2. Small vessel ischemic changes of the white matter and old appearing bilateral acute infarcts. Electronically Signed: By: Donavan Foil M.D. On: 04/24/2017 20:47     Etta Quill PA-C [facility] [phone]  Impression: 67 year old female presenting with frequent recurrent right occipital serizures. Etiology could include hyperglycemic hemianopia vs related to previous stroike. Her most recent seizure was at 4am and she was started on vimpat in response. Clinically improving.   Recommendations: 1) Continue keppra 1g BID(renally dosed) 2) Continue dilantin 100mg  TID 3) dilantin level 4) continue vimpat 100mg  BID 5) Continue LTM EEG will need to hold off on MRI until seizures under better control.   Roland Rack, MD [facility] [phone]  If 7pm- 7am, please page neurology on call as listed in [place].   04/26/2017, 9:28 AM

## 2017-04-27 ENCOUNTER — Inpatient Hospital Stay (HOSPITAL_COMMUNITY): Payer: Medicare Other

## 2017-04-27 DIAGNOSIS — N183 Chronic kidney disease, stage 3 (moderate): Secondary | ICD-10-CM | POA: Diagnosis not present

## 2017-04-27 DIAGNOSIS — I639 Cerebral infarction, unspecified: Secondary | ICD-10-CM

## 2017-04-27 DIAGNOSIS — E1165 Type 2 diabetes mellitus with hyperglycemia: Secondary | ICD-10-CM | POA: Diagnosis not present

## 2017-04-27 DIAGNOSIS — G40901 Epilepsy, unspecified, not intractable, with status epilepticus: Secondary | ICD-10-CM | POA: Diagnosis not present

## 2017-04-27 DIAGNOSIS — Z8673 Personal history of transient ischemic attack (TIA), and cerebral infarction without residual deficits: Secondary | ICD-10-CM | POA: Diagnosis not present

## 2017-04-27 DIAGNOSIS — E785 Hyperlipidemia, unspecified: Secondary | ICD-10-CM | POA: Diagnosis not present

## 2017-04-27 DIAGNOSIS — I10 Essential (primary) hypertension: Secondary | ICD-10-CM | POA: Diagnosis not present

## 2017-04-27 DIAGNOSIS — R4182 Altered mental status, unspecified: Secondary | ICD-10-CM | POA: Diagnosis not present

## 2017-04-27 DIAGNOSIS — G9340 Encephalopathy, unspecified: Secondary | ICD-10-CM | POA: Diagnosis not present

## 2017-04-27 DIAGNOSIS — E1140 Type 2 diabetes mellitus with diabetic neuropathy, unspecified: Secondary | ICD-10-CM | POA: Diagnosis not present

## 2017-04-27 LAB — BASIC METABOLIC PANEL
Anion gap: 9 (ref 5–15)
BUN: 17 mg/dL (ref 6–20)
CHLORIDE: 101 mmol/L (ref 101–111)
CO2: 27 mmol/L (ref 22–32)
CREATININE: 1.36 mg/dL — AB (ref 0.44–1.00)
Calcium: 8.7 mg/dL — ABNORMAL LOW (ref 8.9–10.3)
GFR calc Af Amer: 45 mL/min — ABNORMAL LOW (ref >60)
GFR calc non Af Amer: 39 mL/min — ABNORMAL LOW (ref >60)
GLUCOSE: 188 mg/dL — AB (ref 65–99)
Potassium: 2.9 mmol/L — ABNORMAL LOW (ref 3.5–5.1)
Sodium: 137 mmol/L (ref 135–145)

## 2017-04-27 LAB — CBC
HEMATOCRIT: 41.5 % (ref 36.0–46.0)
Hemoglobin: 13 g/dL (ref 12.0–15.0)
MCH: 27.1 pg (ref 26.0–34.0)
MCHC: 31.3 g/dL (ref 30.0–36.0)
MCV: 86.5 fL (ref 78.0–100.0)
PLATELETS: 242 10*3/uL (ref 150–400)
RBC: 4.8 MIL/uL (ref 3.87–5.11)
RDW: 15.2 % (ref 11.5–15.5)
WBC: 6.1 10*3/uL (ref 4.0–10.5)

## 2017-04-27 LAB — GLUCOSE, CAPILLARY
GLUCOSE-CAPILLARY: 150 mg/dL — AB (ref 65–99)
Glucose-Capillary: 136 mg/dL — ABNORMAL HIGH (ref 65–99)
Glucose-Capillary: 148 mg/dL — ABNORMAL HIGH (ref 65–99)
Glucose-Capillary: 194 mg/dL — ABNORMAL HIGH (ref 65–99)

## 2017-04-27 LAB — ECHOCARDIOGRAM COMPLETE
HEIGHTINCHES: 65 in
Weight: 2960 oz

## 2017-04-27 LAB — MAGNESIUM: Magnesium: 2 mg/dL (ref 1.7–2.4)

## 2017-04-27 MED ORDER — LORAZEPAM 2 MG/ML IJ SOLN
1.0000 mg | Freq: Once | INTRAMUSCULAR | Status: AC
  Administered 2017-04-27: 1 mg via INTRAVENOUS
  Filled 2017-04-27: qty 1

## 2017-04-27 MED ORDER — POTASSIUM CHLORIDE CRYS ER 20 MEQ PO TBCR
40.0000 meq | EXTENDED_RELEASE_TABLET | ORAL | Status: AC
  Administered 2017-04-27 (×2): 40 meq via ORAL
  Filled 2017-04-27 (×2): qty 2

## 2017-04-27 MED ORDER — IOPAMIDOL (ISOVUE-370) INJECTION 76%
INTRAVENOUS | Status: AC
  Filled 2017-04-27: qty 100

## 2017-04-27 NOTE — Progress Notes (Signed)
Benefit check for Vimpat.  Coverage- Medicaid.  PATIENT HAS [facility] PART- B ONLY      MEDICAID [place] ACCESS      EFF- DATE: 04/24/2017      CO-PAY- 3.80 FOR EACH RX

## 2017-04-27 NOTE — NC FL2 (Signed)
[place] MEDICAID FL2 LEVEL OF CARE SCREENING TOOL     IDENTIFICATION  Patient Name: Caitlin Morris Birthdate: [birth year] Sex: female Admission Date (Current Location): 04/24/2017  [facility] and [place] Number:  Herbalist and Address:  [facility] [place]. [facility], [place] [address] [place], [place] [address]      Provider Number: [id number]  Attending Physician Name and Address:  Caren Griffins, MD  Relative Name and Phone Number:  Hoyle Sauer, daughter, [phone]    Current Level of Care: Hospital Recommended Level of Care: [place] Prior Approval Number:    Date Approved/Denied:   PASRR Number: [id number]  Discharge Plan: SNF    Current Diagnoses: Patient Active Problem List   Diagnosis Date Noted  . Stage II pressure ulcer of buttock 04/25/2017  . Acute encephalopathy 04/24/2017  . Abnormal lateral conjugate gaze 04/24/2017  . CVA (cerebral vascular accident) ([place]) 04/24/2017  . Hyperosmolar non-ketotic state in patient with type 2 diabetes mellitus ([place]) 05/20/2016  . Hyperlipidemia 05/20/2016  . Stroke ([place]) 05/20/2016  . Arthritis 05/20/2016  . AKI (acute kidney injury) ([place]) 05/20/2016  . Glaucoma 05/20/2016  . Type 2 diabetes mellitus, uncontrolled, with neuropathy ([place]) 11/10/2015  . Polyneuropathy due to type 2 diabetes mellitus ([place]) 11/10/2015  . Essential hypertension 11/10/2015  . Bradycardia 11/10/2015  . Depression with anxiety 11/10/2015  . CKD (chronic kidney disease) stage 3, GFR 30-59 ml/min (HCC) 11/10/2015  . Diabetic nephropathy ([place]) 11/10/2015  . Uncontrolled diabetes mellitus ([place]) 11/10/2015  . Arrhythmia 11/09/2015  . Cardiac arrhythmia 11/09/2015  . Hyperparathyroidism, primary ([place]) 11/08/2015  . Avulsion fracture of ankle 10/15/2015  . Osteoarthritis of left knee 10/01/2013  . DENTAL PAIN 10/16/2009  . VAGINAL DISCHARGE 07/07/2009  . ARTHRALGIA 07/07/2009  . RENAL INSUFFICIENCY 06/21/2009  .  DEGENERATIVE JOINT DISEASE, LEFT KNEE 05/21/2009  . KNEE PAIN, LEFT 05/05/2009  . WEIGHT GAIN 05/05/2009  . PROTEINURIA 05/05/2009  . BIPOLAR DISORDER UNSPECIFIED 04/04/2008  . HYPERTENSION 10/31/2007  . OSTEOPENIA 10/24/2005  . Hypercalcemia 01/24/2005    Orientation RESPIRATION BLADDER Height & Weight     (Disoriented x4)  Normal Incontinent, External catheter Weight: 83.9 kg (185 lb) Height:  5\' 5"  (165.1 cm)  BEHAVIORAL SYMPTOMS/MOOD NEUROLOGICAL BOWEL NUTRITION STATUS    (Hx of stroke) Continent Diet(Please see DC Summary)  AMBULATORY STATUS COMMUNICATION OF NEEDS Skin   Extensive Assist Verbally PU Stage and Appropriate Care(Stage II on buttocks)                       Personal Care Assistance Level of Assistance  Bathing, Feeding, Dressing Bathing Assistance: Maximum assistance Feeding assistance: Maximum assistance Dressing Assistance: Maximum assistance     Functional Limitations Info  Sight Sight Info: Impaired        SPECIAL CARE FACTORS FREQUENCY  PT (By licensed PT), OT (By licensed OT)     PT Frequency: 5x/week OT Frequency: 3x/week            Contractures      Additional Factors Info  Code Status, Allergies, Psychotropic, Insulin Sliding Scale Code Status Info: Full Allergies Info: Codeine Psychotropic Info: Klonopin; Zoloft Insulin Sliding Scale Info: 3x daily with meals and at bedtime       Current Medications (04/27/2017):  This is the current hospital active medication list Current Facility-Administered Medications  Medication Dose Route Frequency Provider Last Rate Last Dose  . 0.9 %  sodium chloride infusion   Intravenous Continuous [place],  Suann Larry, MD 125 mL/hr at 04/27/17 0030    . acetaminophen (TYLENOL) tablet 650 mg  650 mg Oral Q4H PRN Toy Baker, MD       Or  . acetaminophen (TYLENOL) solution 650 mg  650 mg Per Tube Q4H PRN Doutova, Anastassia, MD       Or  . acetaminophen (TYLENOL) suppository 650 mg  650  mg Rectal Q4H PRN Doutova, Anastassia, MD      . allopurinol (ZYLOPRIM) tablet 100 mg  100 mg Oral BID Toy Baker, MD   100 mg at 04/27/17 0947  . amLODipine (NORVASC) tablet 10 mg  10 mg Oral Daily Edwin Dada, MD   10 mg at 04/27/17 0946  . aspirin suppository 300 mg  300 mg Rectal Daily Doutova, Anastassia, MD       Or  . aspirin tablet 325 mg  325 mg Oral Daily Doutova, Anastassia, MD   325 mg at 04/27/17 0946  . brimonidine (ALPHAGAN) 0.2 % ophthalmic solution 1 drop  1 drop Both Eyes BID Doutova, Anastassia, MD   1 drop at 04/27/17 0947   And  . timolol (TIMOPTIC) 0.5 % ophthalmic solution 1 drop  1 drop Both Eyes BID Toy Baker, MD   1 drop at 04/27/17 0947  . clonazePAM (KLONOPIN) tablet 1 mg  1 mg Oral QHS Doutova, Anastassia, MD   1 mg at 04/26/17 2259  . gabapentin (NEURONTIN) tablet 600 mg  600 mg Oral BID Toy Baker, MD   600 mg at 04/27/17 0947  . insulin aspart (novoLOG) injection 0-15 Units  0-15 Units Subcutaneous TID WC Toy Baker, MD   2 Units at 04/27/17 0946  . insulin aspart (novoLOG) injection 0-5 Units  0-5 Units Subcutaneous QHS Toy Baker, MD   2 Units at 04/25/17 2304  . insulin detemir (LEVEMIR) injection 25 Units  25 Units Subcutaneous Q2200 Toy Baker, MD   25 Units at 04/26/17 2302  . lacosamide (VIMPAT) 150 mg in sodium chloride 0.9 % 25 mL IVPB  150 mg Intravenous BID Caren Griffins, MD 80 mL/hr at 04/27/17 0947 150 mg at 04/27/17 0947  . levETIRAcetam (KEPPRA) IVPB 1000 mg/100 mL premix  1,000 mg Intravenous Q12H Greta Doom, MD 400 mL/hr at 04/27/17 0608 1,000 mg at 04/27/17 0608  . LORazepam (ATIVAN) injection 1 mg  1 mg Intravenous Once Greta Doom, MD      . ondansetron [facility]) injection 4 mg  4 mg Intravenous Q6H PRN Edwin Dada, MD   4 mg at 04/25/17 1008  . phenytoin (DILANTIN) injection 100 mg  100 mg Intravenous Q8H Greta Doom, MD   100 mg at  04/27/17 0608  . pravastatin (PRAVACHOL) tablet 80 mg  80 mg Oral QHS Toy Baker, MD   80 mg at 04/26/17 2258  . senna-docusate (Senokot-S) tablet 1 tablet  1 tablet Oral QHS PRN Toy Baker, MD      . sertraline (ZOLOFT) tablet 100 mg  100 mg Oral QHS Toy Baker, MD   100 mg at 04/26/17 2259     Discharge Medications: Please see discharge summary for a list of discharge medications.  Relevant Imaging Results:  Relevant Lab Results:   Additional Information SSN: [place], [place]

## 2017-04-27 NOTE — Procedures (Signed)
  Electroencephalogram report- LTM   Data acquisition: 10-20 electrode placement.  Additional T1, T2, and EKG electrodes; 26 channel digital referential acquisition reformatted to 18 channel/7 channel coronal bipolar     Spike detection: ON     Seizure detection: ON   Beginning time: 04/26/2017 at 08 45 Ending time: 04/27/2017 at 2:05 AM  CPT: 93235 Day of study: Day 2   This 19 hours of intensive EEG monitoring with simultaneous video monitoring was performed for this patient with spells suspicious for seizures to rule out clinical and subclinical electrographic seizures.   Medications: As per EMR  Day 1 Background activity is marked by mild background activity slowing predominantly in the theta range.  Superimposed right parieto-occipital interictal epileptiform discharges present maximum negativity right occipital cortex and form of fast's bike activities as well as 4-5 cps spike and wave discharges maximum negativity at the right parieto-occipital cortex.  In addition electrographic seizures present fairly frequently every 15-20 minutes without any clinical accompaniment.  Electrographic seizures marked by buildup and evolving spike and wave discharges across the right posterior cortex maximum negativity in the right occipital region with involution in the frequency morphology amplitude and negative field extending into the left occipital cortex as well.  Last electrographic seizure around 4 AM.  Day 2: No clinical or subclinical seizures recorded.  Mild right hemispheric slowing present,  however without interictal epileptiform discharges.  Clinical interpretation: This day 2 of intensive EEG monitoring with simultaneous video monitoring consistent with improvement and resolution of electrographic seizures across right parieto-occipital region.  Clinical correlation is advised.

## 2017-04-27 NOTE — Clinical Social Work Note (Signed)
Clinical Social Work Assessment  Patient Details  Name: Caitlin Morris MRN: [id number] Date of Birth: 1950-04-30  Date of referral:  04/27/17               Reason for consult:  Facility Placement                Permission sought to share information with:  Facility Sport and exercise psychologist, Family Supports Permission granted to share information::  Yes, Verbal Permission Granted  Name::     Nature conservation officer::  SNFs  Relationship::  Daughter  Contact Information:  [phone]  Housing/Transportation Living arrangements for the past 2 months:  Single Family Home Source of Information:  Patient, Other (Comment Required) Patient Interpreter Needed:  None Criminal Activity/Legal Involvement Pertinent to Current Situation/Hospitalization:  No - Comment as needed Significant Relationships:  Adult Children, Other Family Members Lives with:  Relatives(Grandson) Do you feel safe going back to the place where you live?  No Need for family participation in patient care:  Yes (Comment)  Care giving concerns:  CSW received consult for possible SNF placement at time of discharge. CSW spoke with patient regarding PT recommendation of SNF placement at time of discharge. Patient's grandson also at bedside who stated he had planned on staying with the patient to help out. CSW explained that patient would get more therapy at a facility. Patient expressed understanding of PT recommendation and is agreeable to SNF placement at time of discharge. CSW to continue to follow and assist with discharge planning needs.   Social Worker assessment / plan:  CSW spoke with patient concerning possibility of rehab at [facility] before returning home.  Employment status:  Retired Forensic scientist:  Medicare PT Recommendations:  [place] / Referral to community resources:  [place]  Patient/Family's Response to care:  Patient recognizes need for rehab before returning home and  is agreeable to a SNF in [place]. Patient unsure of which facility to request, so she asked CSW to call her daughter. CSW left voicemail.  Patient/Family's Understanding of and Emotional Response to Diagnosis, Current Treatment, and Prognosis:  Patient/family is realistic regarding therapy needs and expressed being hopeful for SNF placement. Patient expressed understanding of CSW role and discharge process as well as medical condition. No questions/concerns about plan or treatment.    Emotional Assessment Appearance:  Appears stated age Attitude/Demeanor/Rapport:  Gracious Affect (typically observed):  Accepting, Appropriate Orientation:  Oriented to Self, Oriented to Place, Oriented to Situation Alcohol / Substance use:  Not Applicable Psych involvement (Current and /or in the community):  No (Comment)  Discharge Needs  Concerns to be addressed:  Care Coordination Readmission within the last 30 days:  No Current discharge risk:  None Barriers to Discharge:  Continued Medical Work up   [employer], [place] 04/27/2017, 10:59 AM

## 2017-04-27 NOTE — Progress Notes (Signed)
  Echocardiogram 2D Echocardiogram has been performed.  Elsbeth Yearick G Gunnison Chahal 04/27/2017, 12:45 PM

## 2017-04-27 NOTE — Plan of Care (Signed)

## 2017-04-27 NOTE — Progress Notes (Addendum)
Subjective: Continues to be greatly improved  Exam: Vitals:   04/27/17 0813 04/27/17 1330  BP: (!) 167/93 (!) 161/99  Pulse: (!) 57 (!) 59  Resp: 20 20  Temp: 98.2 F (36.8 C) 98.4 F (36.9 C)  SpO2: 93% 97%   Gen: In bed, NAD Resp: non-labored breathing, no acute distress Abd: soft, nt  Neuro: MS: Awake, alert, interactive and appropriate CN: She has a RIGHT hemianopia, face symmetric Motor: She has good strength throughout Sensory: Intact light touch, no extinction  Pertinent Labs: Hypokalemic  Impression: 67 year old female presenting with recurrent focal seizures without return to baseline in between(status epilepticus) in the right occipital region.  She is requiring 4 drug regimen(5 if Klonopin is counted), but has greatly improved both clinically and electrographically.  Recommendations: 1) MRI brain 2) continue LTM EEG, but will have to be disconnected for a period of time to go to MRI 3) continue gabapentin 600 mg twice daily 4) continue Vimpat 150 mg twice daily 5) continue Keppra 1 g twice daily(maximum renal dose) 6) continue Dilantin 100 mg every 8 hours  Roland Rack, MD [facility] [phone]  If 7pm- 7am, please page neurology on call as listed in [place].

## 2017-04-27 NOTE — Progress Notes (Signed)
PROGRESS NOTE  Caitlin Morris KNL:[id number] DOB: 1950/06/03 DOA: 04/24/2017 PCP: Benito Mccreedy, MD   LOS: 3 days   Brief Narrative / Interim history: 67 year old female with diabetes, hypertension, obesity, chronic kidney disease stage III with baseline creatinine 1.4, sick sinus syndrome currently with no pacer, history of CVA who presents with headache, vision loss, and confusion.  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  Underwent continuous EEG which appears to be negative for further seizures.  Management with AEDs per neurology -MRI is pending, does not seem to be able to tolerate today, received Ativan and had a paradoxical reaction with hallucinations, will try to obtain MRI with sedation -Continue aspirin -2D echo, lipids, PT/OT to complete CVA workup  Acute metabolic encephalopathy in the setting of status epilepticus -EEG on 4/2 showed status epilepticus, patient was given Keppra, Dilantin, Vimpat, s/p continuous EEG   Hypertension -Continue Norvasc  Diabetes mellitus, poorly controlled, with polyneuropathy -Continue Levemir and sliding scale, CBG this morning 164, continue gabapentin for neuropathy -Most recent hemoglobin A1c a few days ago 11.3  CKD stage III -Creatinine stable, 1.3 today  Gout -quiescent, continue allopurinol  Glaucoma -Continue eye drops  Anxiety Mood disorder -Keep on home regimen    DVT prophylaxis: SCDs Code Status: Full code Family Communication: Discussed with daughter at  bedside Disposition Plan: Home when ready  Consultants:   Neurology  Procedures:   EEG EEG Abnormalities: 1) 3 partial seizures lasting 1.5-2 minutes arising from the right parietooccipital region Clinical Interpretation: This EEG recorded multiple seizures arising from the right parieto-occipital region.   This was communicated with the neurologist seeing her today and she was continued on long-term monitoring.  Antimicrobials:  None    Subjective: -She is feeling well, she answers orientation questions appropriately except for the year, she thinks is 1990, persistent  Objective: Vitals:   04/27/17 0604 04/27/17 0605 04/27/17 0813 04/27/17 1330  BP: (!) 171/83 (!) 171/83 (!) 167/93 (!) 161/99  Pulse: (!) 57 (!) 57 (!) 57 (!) 59  Resp: 19  20 20   Temp: 98.6 F (37 C)  98.2 F (36.8 C) 98.4 F (36.9 C)  TempSrc: Oral  Oral Oral  SpO2: 93% 94% 93% 97%  Weight:      Height:        Intake/Output Summary (Last 24 hours) at 04/27/2017 1607 Last data filed at 04/27/2017 1004 Gross per 24 hour  Intake 695 ml  Output 1200 ml  Net -505 ml   Filed Weights   04/24/17 1714  Weight: 83.9 kg (185 lb)    Examination:  Constitutional: No distress Eyes: No scleral icterus ENMT: Moist mucous membranes Respiratory: Clear to auscultation bilaterally without wheezing or crackles Cardiovascular: Regular rate and rhythm, no murmurs heard.  No edema. Abdomen: Soft, nontender, nondistended, bowel sounds positive Skin: No rashes seen Neurologic: Nonfocal, equal strength Psychiatric: Normal judgment and insight. Alert and oriented x 2, not to time.   Data Reviewed: I have independently reviewed following labs and imaging studies   CBC: Recent Labs  Lab 04/24/17 1805 04/26/17 1042 04/27/17  0410  WBC 9.4 8.2 6.1  HGB 14.5 13.5 13.0  HCT 43.4 42.6 41.5  MCV 83.6 85.9 86.5  PLT 305 254 932   Basic Metabolic Panel: Recent Labs  Lab 04/24/17 1730 04/25/17 1528 04/26/17 1042  04/27/17 0410  NA 134* 139 139 137  K 3.6 3.1* 2.9* 2.9*  CL 93* 98* 102 101  CO2 26 27 26 27   GLUCOSE 390* 203* 201* 188*  BUN 23* 24* 22* 17  CREATININE 1.42* 1.53* 1.48* 1.36*  CALCIUM 9.8 9.4 8.7* 8.7*   GFR: Estimated Creatinine Clearance: 42.4 mL/min (A) (by C-G formula based on SCr of 1.36 mg/dL (H)). Liver Function Tests: Recent Labs  Lab 04/24/17 1730  AST 22  ALT 14  ALKPHOS 131*  BILITOT 1.6*  PROT 8.1  ALBUMIN 3.9   Recent Labs  Lab 04/24/17 1730  LIPASE 33   No results for input(s): AMMONIA in the last 168 hours. Coagulation Profile: Recent Labs  Lab 04/24/17 2014  INR 1.19   Cardiac Enzymes: Recent Labs  Lab 04/25/17 0000 04/25/17 1528  TROPONINI 0.05* 0.06*   BNP (last 3 results) No results for input(s): PROBNP in the last 8760 hours. HbA1C: Recent Labs    04/25/17 0635  HGBA1C 11.3*   CBG: Recent Labs  Lab 04/26/17 1707 04/26/17 1822 04/26/17 2257 04/27/17 0752 04/27/17 1220  GLUCAP 268* 229* 188* 136* 148*   Lipid Profile: Recent Labs    04/25/17 0635  CHOL 195  HDL 38*  LDLCALC 117*  TRIG 198*  CHOLHDL 5.1   Thyroid Function Tests: No results for input(s): TSH, T4TOTAL, FREET4, T3FREE, THYROIDAB in the last 72 hours. Anemia Panel: No results for input(s): VITAMINB12, FOLATE, FERRITIN, TIBC, IRON, RETICCTPCT in the last 72 hours. Urine analysis:    Component Value Date/Time   COLORURINE YELLOW 04/24/2017 1759   APPEARANCEUR CLEAR 04/24/2017 1759   LABSPEC 1.026 04/24/2017 1759   PHURINE 6.0 04/24/2017 1759   GLUCOSEU >=500 (A) 04/24/2017 1759   HGBUR SMALL (A) 04/24/2017 1759   HGBUR negative 07/07/2009 0957   BILIRUBINUR NEGATIVE 04/24/2017 1759   KETONESUR NEGATIVE 04/24/2017 1759   PROTEINUR >=300 (A) 04/24/2017 1759   UROBILINOGEN 1.0 10/01/2013 0915   NITRITE NEGATIVE 04/24/2017 1759   LEUKOCYTESUR SMALL (A) 04/24/2017 1759   Sepsis Labs: Invalid input(s): PROCALCITONIN, LACTICIDVEN  Recent Results  (from the past 240 hour(s))  Urine culture     Status: Abnormal   Collection Time: 04/24/17 11:14 PM  Result Value Ref Range Status   Specimen Description   Final    URINE, RANDOM Performed at [facility] [address] [place], [place] [address]    Special Requests   Final    NONE Performed at [facility], [place] [address] [place], [place] [address]    Culture MULTIPLE SPECIES PRESENT, SUGGEST RECOLLECTION (A)  Final   Report Status 04/26/2017 FINAL  Final      Radiology Studies: No results found.   Scheduled Meds: . allopurinol  100 mg Oral BID  . amLODipine  10 mg Oral Daily  . aspirin  300 mg Rectal Daily   Or  . aspirin  325 mg Oral Daily  . brimonidine  1 drop Both Eyes BID   And  . timolol  1 drop Both Eyes BID  . clonazePAM  1 mg Oral QHS  . gabapentin  600 mg Oral BID  . insulin aspart  0-15 Units Subcutaneous TID WC  . insulin aspart  0-5 Units Subcutaneous QHS  . insulin detemir  25 Units Subcutaneous Q2200  . iopamidol      . phenytoin (DILANTIN) IV  100 mg Intravenous Q8H  . pravastatin  80 mg Oral QHS  . sertraline  100 mg Oral QHS   Continuous Infusions: . sodium chloride 125 mL/hr at 04/27/17 0030  . lacosamide (VIMPAT) IV 150 mg (04/27/17 0947)  . levETIRAcetam 1,000 mg (04/27/17 0174)   Marzetta Board, MD, PhD [facility] Pager [phone]  If 7PM-7AM, please contact night-coverage [email or web address] Password [email or web address] 04/27/2017, 4:07 PM

## 2017-04-28 ENCOUNTER — Inpatient Hospital Stay (HOSPITAL_COMMUNITY): Payer: Medicare Other | Admitting: Anesthesiology

## 2017-04-28 ENCOUNTER — Inpatient Hospital Stay (HOSPITAL_COMMUNITY): Payer: Medicare Other

## 2017-04-28 ENCOUNTER — Encounter (HOSPITAL_COMMUNITY)
Admission: EM | Disposition: A | Discharge status: Skilled Nursing Facility | Payer: Self-pay | Source: Home / Self Care | Attending: Internal Medicine

## 2017-04-28 DIAGNOSIS — G40901 Epilepsy, unspecified, not intractable, with status epilepticus: Secondary | ICD-10-CM | POA: Diagnosis not present

## 2017-04-28 DIAGNOSIS — I639 Cerebral infarction, unspecified: Secondary | ICD-10-CM | POA: Diagnosis not present

## 2017-04-28 DIAGNOSIS — Z8673 Personal history of transient ischemic attack (TIA), and cerebral infarction without residual deficits: Secondary | ICD-10-CM | POA: Diagnosis not present

## 2017-04-28 DIAGNOSIS — G9341 Metabolic encephalopathy: Secondary | ICD-10-CM | POA: Diagnosis not present

## 2017-04-28 DIAGNOSIS — R569 Unspecified convulsions: Secondary | ICD-10-CM | POA: Diagnosis not present

## 2017-04-28 DIAGNOSIS — R4182 Altered mental status, unspecified: Secondary | ICD-10-CM | POA: Diagnosis not present

## 2017-04-28 DIAGNOSIS — N183 Chronic kidney disease, stage 3 (moderate): Secondary | ICD-10-CM | POA: Diagnosis not present

## 2017-04-28 DIAGNOSIS — E1140 Type 2 diabetes mellitus with diabetic neuropathy, unspecified: Secondary | ICD-10-CM | POA: Diagnosis not present

## 2017-04-28 DIAGNOSIS — G9340 Encephalopathy, unspecified: Secondary | ICD-10-CM | POA: Diagnosis not present

## 2017-04-28 DIAGNOSIS — E1165 Type 2 diabetes mellitus with hyperglycemia: Secondary | ICD-10-CM | POA: Diagnosis not present

## 2017-04-28 DIAGNOSIS — H47619 Cortical blindness, unspecified side of brain: Secondary | ICD-10-CM | POA: Diagnosis not present

## 2017-04-28 DIAGNOSIS — E785 Hyperlipidemia, unspecified: Secondary | ICD-10-CM | POA: Diagnosis not present

## 2017-04-28 DIAGNOSIS — I69354 Hemiplegia and hemiparesis following cerebral infarction affecting left non-dominant side: Secondary | ICD-10-CM | POA: Diagnosis not present

## 2017-04-28 HISTORY — PX: RADIOLOGY WITH ANESTHESIA: SHX6223

## 2017-04-28 LAB — CBC
HEMATOCRIT: 44.2 % (ref 36.0–46.0)
HEMOGLOBIN: 14.1 g/dL (ref 12.0–15.0)
MCH: 27.3 pg (ref 26.0–34.0)
MCHC: 31.9 g/dL (ref 30.0–36.0)
MCV: 85.7 fL (ref 78.0–100.0)
Platelets: 275 10*3/uL (ref 150–400)
RBC: 5.16 MIL/uL — ABNORMAL HIGH (ref 3.87–5.11)
RDW: 15.1 % (ref 11.5–15.5)
WBC: 5.6 10*3/uL (ref 4.0–10.5)

## 2017-04-28 LAB — GLUCOSE, CAPILLARY
Glucose-Capillary: 181 mg/dL — ABNORMAL HIGH (ref 65–99)
Glucose-Capillary: 174 mg/dL — ABNORMAL HIGH (ref 65–99)
Glucose-Capillary: 164 mg/dL — ABNORMAL HIGH (ref 65–99)

## 2017-04-28 LAB — BASIC METABOLIC PANEL
ANION GAP: 13 (ref 5–15)
BUN: 10 mg/dL (ref 6–20)
CO2: 23 mmol/L (ref 22–32)
Calcium: 9 mg/dL (ref 8.9–10.3)
Chloride: 103 mmol/L (ref 101–111)
Creatinine, Ser: 1.09 mg/dL — ABNORMAL HIGH (ref 0.44–1.00)
GFR calc Af Amer: 59 mL/min — ABNORMAL LOW (ref >60)
GFR calc non Af Amer: 51 mL/min — ABNORMAL LOW (ref >60)
GLUCOSE: 167 mg/dL — AB (ref 65–99)
POTASSIUM: 3.5 mmol/L (ref 3.5–5.1)
Sodium: 139 mmol/L (ref 135–145)

## 2017-04-28 SURGERY — MRI WITH ANESTHESIA
Anesthesia: General

## 2017-04-28 MED ORDER — LACTATED RINGERS IV SOLN
INTRAVENOUS | Status: DC | PRN
  Administered 2017-04-28: 07:00:00 via INTRAVENOUS

## 2017-04-28 MED ORDER — HYDRALAZINE HCL 20 MG/ML IJ SOLN
10.0000 mg | INTRAMUSCULAR | Status: DC | PRN
  Administered 2017-04-28: 10 mg via INTRAVENOUS
  Filled 2017-04-28: qty 1

## 2017-04-28 MED ORDER — FENTANYL CITRATE (PF) 100 MCG/2ML IJ SOLN: 25.0000 ug | INTRAMUSCULAR | Status: DC | PRN

## 2017-04-28 MED ORDER — ONDANSETRON HCL 4 MG/2ML IJ SOLN: 4.0000 mg | Freq: Once | INTRAMUSCULAR | Status: DC | PRN

## 2017-04-28 MED ORDER — HYDRALAZINE HCL 20 MG/ML IJ SOLN: 10.0000 mg | Freq: Once | INTRAMUSCULAR | Status: DC

## 2017-04-28 MED ORDER — HYDRALAZINE HCL 20 MG/ML IJ SOLN
INTRAMUSCULAR | Status: AC
  Administered 2017-04-28: 20 mg
  Filled 2017-04-28: qty 1

## 2017-04-28 NOTE — Plan of Care (Signed)
Progressing

## 2017-04-28 NOTE — Anesthesia Postprocedure Evaluation (Signed)
Anesthesia Post Note  Patient: Caitlin Morris  Procedure(s) Performed: BRAIN WITHOUT CONTRAST (N/A )     Patient location during evaluation: PACU Anesthesia Type: General Level of consciousness: awake and alert and awake (Neuro status at baseline) Pain management: pain level controlled Vital Signs Assessment: post-procedure vital signs reviewed and stable Respiratory status: spontaneous breathing, nonlabored ventilation and respiratory function stable Cardiovascular status: blood pressure returned to baseline and stable Postop Assessment: no apparent nausea or vomiting Anesthetic complications: no    Last Vitals:  Vitals:   04/28/17 1030 04/28/17 1035  BP: 134/71   Pulse:    Resp: 20 (!) 27  Temp:  (!) 36.1 C  SpO2: 100%     Last Pain:  Vitals:   04/28/17 1015  TempSrc:   PainSc: Asleep                 [name]

## 2017-04-28 NOTE — Anesthesia Procedure Notes (Signed)
Procedure Name: LMA Insertion Date/Time: 04/28/2017 8:37 AM Performed by: Mariea Clonts, CRNA Pre-anesthesia Checklist: Patient identified, Emergency Drugs available, Suction available and Patient being monitored Patient Re-evaluated:Patient Re-evaluated prior to induction Oxygen Delivery Method: Circle System Utilized Preoxygenation: Pre-oxygenation with 100% oxygen Induction Type: IV induction Ventilation: Mask ventilation without difficulty LMA: LMA inserted LMA Size: 4.0 Number of attempts: 1 Airway Equipment and Method: Bite block Placement Confirmation: positive ETCO2 Tube secured with: Tape Dental Injury: Teeth and Oropharynx as per pre-operative assessment

## 2017-04-28 NOTE — Transfer of Care (Signed)
Immediate Anesthesia Transfer of Care Note  Patient: Caitlin Morris  Procedure(s) Performed: BRAIN WITHOUT CONTRAST (N/A )  Patient Location: PACU  Anesthesia Type:General  Level of Consciousness: awake, alert  and oriented  Airway & Oxygen Therapy: Patient Spontanous Breathing and Patient connected to nasal cannula oxygen  Post-op Assessment: Report given to RN, Post -op Vital signs reviewed and stable and Patient moving all extremities X 4  Post vital signs: Reviewed and stable  Last Vitals:  Vitals Value Taken Time  BP 171/112 04/28/2017  9:44 AM  Temp 36.1 C 04/28/2017  9:45 AM  Pulse 70 04/28/2017  9:51 AM  Resp 14 04/28/2017  9:53 AM  SpO2 100 % 04/28/2017  9:51 AM  Vitals shown include unvalidated device data.  Last Pain:  Vitals:   04/28/17 0554  TempSrc: Oral  PainSc:          Complications: No apparent anesthesia complications

## 2017-04-28 NOTE — Progress Notes (Signed)
Subjective: Continues to be greatly improved  Exam: Vitals:   04/28/17 1035 04/28/17 1130  BP:  122/85  Pulse:  65  Resp: (!) 27 20  Temp: (!) 97 F (36.1 C) 98.2 F (36.8 C)  SpO2:  100%   Gen: In bed, NAD Resp: non-labored breathing, no acute distress Abd: soft, nt  Neuro: MS: Awake, alert, interactive and appropriate CN: She has a RIGHT hemianopia, face symmetric Motor: She has good strength throughout Sensory: Intact light touch, no extinction  MRI reviewed- T2 hypointensity in the right occipital region  Impression: 67 year old female presenting with recurrent focal seizures without return to baseline in between(status epilepticus) in the right occipital region.  She is requiring 4 drug regimen(5 if Klonopin is counted), but has greatly improved both clinically and electrographically.  Her clinical course of presenting with hyperglycemia, refractory seizures causing hemianopia, imaging findings with T2 hypointensity in the right occipital region are all most consistent with hyperglycemic hemianopia.  Glucose control will play an important role in seizure prevention in her from this point forward, especially since she was only in the 400s on presentation.  I do not think she will need lifelong for drug regimen, but given the difficulty that we had and stopping the seizures I would favor continuing this for the time being.  Possibly in several months, would consider tapering her down.  Recommendations: 1) glucose control 2) continue LTM EEG, but will have to be disconnected for a period of time to go to MRI 3) continue gabapentin 600 mg twice daily 4) continue Vimpat 150 mg twice daily 5) continue Keppra 1 g twice daily(maximum renal dose) 6) continue Dilantin 100 mg every 8 hours  Roland Rack, MD [facility] [phone]  If 7pm- 7am, please page neurology on call as listed in [place].

## 2017-04-28 NOTE — Progress Notes (Signed)
PT Cancellation Note  Patient Details Name: Caitlin Morris MRN: [id number] DOB: Feb 21, 1950   Cancelled Treatment:    Reason Eval/Treat Not Completed: Patient at procedure or test/unavailable   Thelma Comp 04/28/2017, 7:37 AM   Rolinda Roan, PT, DPT Acute Rehabilitation Services Pager: [phone]

## 2017-04-28 NOTE — Procedures (Signed)
  Electroencephalogram report- LTM   Data acquisition: 10-20 electrode placement.  Additional T1, T2, and EKG electrodes; 26 channel digital referential acquisition reformatted to 18 channel/7 channel coronal bipolar     Spike detection: ON     Seizure detection: ON   Beginning time: 04/27/2017 at 07 45 Ending time: 04/28/2017 at 06:05 AM  CPT: 98264 Day of study: Day 3   This day 3 of  intensive EEG monitoring with simultaneous video monitoring was performed for this patient with spells suspicious for seizures to rule out clinical and subclinical electrographic seizures.   Medications: As per EMR  Day 1 Background activity is marked by mild background activity slowing predominantly in the theta range.  Superimposed right parieto-occipital interictal epileptiform discharges present maximum negativity right occipital cortex and form of fast's bike activities as well as 4-5 cps spike and wave discharges maximum negativity at the right parieto-occipital cortex.  In addition electrographic seizures present fairly frequently every 15-20 minutes without any clinical accompaniment.  Electrographic seizures marked by buildup and evolving spike and wave discharges across the right posterior cortex maximum negativity in the right occipital region with involution in the frequency morphology amplitude and negative field extending into the left occipital cortex as well.  Last electrographic seizure around 4 AM.  Day 2: No clinical or subclinical seizures recorded.  Mild right hemispheric slowing present,  however without interictal epileptiform discharges.  Day 3: No clinical or subclinical seizures recorded.  Mild right hemispheric slowing present.  No  interictal epileptiform discharges.   Clinical interpretation: This day 3 of intensive EEG monitoring with simultaneous video monitoring did not record any clinical or subclinical seizures.

## 2017-04-28 NOTE — Anesthesia Preprocedure Evaluation (Addendum)
Anesthesia Evaluation  Patient identified by MRN, date of birth, ID band Patient awake    Reviewed: Allergy & Precautions, NPO status , Patient's Chart, lab work & pertinent test results  Airway Mallampati: II  TM Distance: >3 FB Neck ROM: Full    Dental  (+) Dental Advisory Given, Poor Dentition, Missing   Pulmonary former smoker,    Pulmonary exam normal breath sounds clear to auscultation       Cardiovascular hypertension, Pt. on medications Normal cardiovascular exam Rhythm:Regular Rate:Normal  SSS 04/27/17: Study Conclusions  - Left ventricle: The cavity size was normal. Wall thickness was increased in a pattern of mild LVH. Systolic function was normal. The estimated ejection fraction was in the range of 55% to 60%. Wall motion was normal; there were no regional wall motion abnormalities. Doppler parameters are consistent with abnormal   left ventricular relaxation (grade 1 diastolic dysfunction). - Aortic valve: There was no stenosis. - Mitral valve: There was no significant regurgitation. - Left atrium: The atrium was mildly dilated. - Right ventricle: The cavity size was normal. Systolic function was normal. - Pulmonary arteries: No complete TR doppler jet so unable to estimate PA systolic pressure. - Inferior vena cava: The vessel was normal in size. The   respirophasic diameter changes were in the normal range (>= 50%), consistent with normal central venous pressure.  Impressions:  - Normal LV size with mild LV hypertrophy. EF 55-60%. Normal RV   size and systolic function. No significant valvular   abnormalities.   Neuro/Psych Seizures -,  PSYCHIATRIC DISORDERS Anxiety Depression Bipolar Disorder  Neuromuscular disease CVA, Residual Symptoms    GI/Hepatic negative GI ROS, Neg liver ROS,   Endo/Other  diabetes, Insulin DependentObesity   Renal/GU Renal InsufficiencyRenal disease     Musculoskeletal  (+)  Arthritis ,   Abdominal   Peds  Hematology negative hematology ROS (+)   Anesthesia Other Findings Day of surgery medications reviewed with the patient.  Reproductive/Obstetrics                            Anesthesia Physical Anesthesia Plan  ASA: IV  Anesthesia Plan: General   Post-op Pain Management:    Induction: Intravenous  PONV Risk Score and Plan: 3 and Dexamethasone, Ondansetron and Treatment may vary due to age or medical condition  Airway Management Planned: LMA  Additional Equipment:   Intra-op Plan:   Post-operative Plan: Extubation in OR  Informed Consent: I have reviewed the patients History and Physical, chart, labs and discussed the procedure including the risks, benefits and alternatives for the proposed anesthesia with the patient or authorized representative who has indicated his/her understanding and acceptance.   Dental advisory given  Plan Discussed with: CRNA  Anesthesia Plan Comments:         Anesthesia Quick Evaluation

## 2017-04-28 NOTE — Progress Notes (Signed)
PROGRESS NOTE  KALAH PFLUM DGL:[id number] DOB: [birth year] DOA: 04/24/2017 PCP: Benito Mccreedy, MD   LOS: 4 days   Brief Narrative / Interim history: 67 year old female with diabetes, hypertension, obesity, chronic kidney disease stage III with baseline creatinine 1.4, sick sinus syndrome currently with no pacer, history of CVA who presents with headache, vision loss, and confusion.  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  Underwent continuous EEG which appears to be negative for further seizures.  Management with AEDs per neurology -She underwent MRI with general anesthesia at this morning, and it did show abnormal signal in the right occipital lobe compatible with hyperglycemia hemianopia, in addition it was negative for acute CVA -Continue aspirin -2D echo with normal LV size with mild LV hypertrophy, EF 51-88%  Acute metabolic encephalopathy in the setting of status epilepticus -EEG on 4/2 showed status epilepticus, patient was given Keppra, Dilantin, Vimpat, EEG was interrupted for MRI this morning, now resume continuous EEG per neurology   Hypertension -Continue Norvasc  Diabetes mellitus, poorly controlled, with polyneuropathy -Continue Levemir and sliding scale, CBG this morning 164, continue gabapentin for neuropathy -Most recent hemoglobin A1c a few days ago 11.3  CKD stage III -Creatinine stable, 1.09 today   Gout -quiescent, continue allopurinol  Glaucoma -Continue eye drops  Anxiety Mood disorder -Keep on home  regimen    DVT prophylaxis: SCDs Code Status: Full code Family Communication: Discussed with daughter at bedside Disposition Plan: Home when ready  Consultants:   Neurology  Procedures:   2D echo Impressions:  - Normal LV size with mild LV hypertrophy. EF 55-60%. Normal RV size and systolic function. No significant valvular abnormalities.   EEG EEG Abnormalities: 1) 3 partial seizures lasting 1.5-2 minutes arising from the right parietooccipital region Clinical Interpretation: This EEG recorded multiple seizures arising from the right parieto-occipital region.   This was communicated with the neurologist seeing her today and she was continued on long-term monitoring.  Antimicrobials:  None    Subjective: -No complaints, seen post MRI and just received general anesthesia so she is a bit sleepy, however tells me that her vision has improved.  She is alert to place, situation but not to time.  She persists that the year is 1990  Objective: Vitals:   04/28/17 1000 04/28/17 1015 04/28/17 1030 04/28/17 1035  BP: (!) 158/109 (!) 148/81 134/71   Pulse:      Resp:   20 (!) 27  Temp:    (!) 97 F (36.1 C)  TempSrc:      SpO2:   100%   Weight:      Height:        Intake/Output Summary (Last 24 hours) at 04/28/2017 1257 Last data filed at 04/28/2017 0558 Gross per 24 hour  Intake 140 ml  Output -  Net 140 ml   Filed Weights   04/24/17 1714  Weight: 83.9 kg (185 lb)    Examination:  Constitutional: NAD Eyes: no scleral icterus ENMT: mmm Respiratory: CTA biL Cardiovascular: RRR without murmurs, no edema Abdomen: soft, NT, ND, BS+ Skin: no rashes  Neurologic: non focal  Data Reviewed: I have independently reviewed following labs and imaging studies   CBC: Recent Labs  Lab 04/24/17 1805 04/26/17 1042 04/27/17 0410 04/28/17 0634  WBC 9.4 8.2 6.1 5.6  HGB 14.5 13.5 13.0 14.1  HCT 43.4 42.6 41.5 44.2  MCV 83.6 85.9 86.5 85.7  PLT 305 254 242 401   Basic  Metabolic Panel: Recent Labs  Lab 04/24/17 1730 04/25/17 1528 04/26/17 1042 04/27/17 0410 04/27/17 2033 04/28/17 0634  NA 134* 139 139 137  --  139  K 3.6 3.1* 2.9* 2.9*  --  3.5  CL 93* 98* 102 101  --  103  CO2 26 27 26 27   --  23  GLUCOSE 390* 203* 201* 188*  --  167*  BUN 23* 24* 22* 17  --  10  CREATININE 1.42* 1.53* 1.48* 1.36*  --  1.09*  CALCIUM 9.8 9.4 8.7* 8.7*  --  9.0  MG  --   --   --   --  2.0  --    GFR: Estimated Creatinine Clearance: 52.9 mL/min (A) (by C-G formula based on SCr of 1.09 mg/dL (H)). Liver Function Tests: Recent Labs  Lab 04/24/17 1730  AST 22  ALT 14  ALKPHOS 131*  BILITOT 1.6*  PROT 8.1  ALBUMIN 3.9   Recent Labs  Lab 04/24/17 1730  LIPASE 33   No results for input(s): AMMONIA in the last 168 hours. Coagulation Profile: Recent Labs  Lab 04/24/17 2014  INR 1.19   Cardiac Enzymes: Recent Labs  Lab 04/25/17 0000 04/25/17 1528  TROPONINI 0.05* 0.06*   BNP (last 3 results) No results for input(s): PROBNP in the last 8760 hours. HbA1C: No results for input(s): HGBA1C in the last 72 hours. CBG: Recent Labs  Lab 04/27/17 0752 04/27/17 1220 04/27/17 1709 04/27/17 2152 04/28/17 1108  GLUCAP 136* 148* 150* 194* 181*   Lipid Profile: No results for input(s): CHOL, HDL, LDLCALC, TRIG, CHOLHDL, LDLDIRECT in the last 72 hours. Thyroid Function Tests: No results for input(s): TSH, T4TOTAL, FREET4, T3FREE, THYROIDAB in the last 72 hours. Anemia Panel: No results for input(s): VITAMINB12, FOLATE, FERRITIN, TIBC, IRON, RETICCTPCT in the last 72 hours. Urine analysis:    Component Value Date/Time   COLORURINE YELLOW 04/24/2017 1759   APPEARANCEUR CLEAR 04/24/2017 1759   LABSPEC 1.026 04/24/2017 1759   PHURINE 6.0 04/24/2017 1759   GLUCOSEU >=500 (A) 04/24/2017 1759   HGBUR SMALL (A) 04/24/2017 1759   HGBUR negative 07/07/2009 0957   BILIRUBINUR NEGATIVE 04/24/2017 1759   KETONESUR NEGATIVE 04/24/2017 1759   PROTEINUR  >=300 (A) 04/24/2017 1759   UROBILINOGEN 1.0 10/01/2013 0915   NITRITE NEGATIVE 04/24/2017 1759   LEUKOCYTESUR SMALL (A) 04/24/2017 1759   Sepsis Labs: Invalid input(s): PROCALCITONIN, LACTICIDVEN  Recent Results (from the past 240 hour(s))  Urine culture     Status: Abnormal   Collection Time: 04/24/17 11:14 PM  Result Value Ref Range Status   Specimen Description   Final    URINE, RANDOM Performed at [facility] [address], [place], [place] [address]    Special Requests   Final    NONE Performed at [facility], [place] [address] [place],  [address]    Culture MULTIPLE SPECIES PRESENT, SUGGEST RECOLLECTION (A)  Final   Report Status 04/26/2017 FINAL  Final      Radiology Studies: Mr Brain Wo Contrast  Addendum Date: 04/28/2017   ADDENDUM REPORT: 04/28/2017 11:50 ADDENDUM: Crist Fat  discussed by telephone with Dr. Roland Rack on 04/28/2017 at 1130 hours. He advises that this patient has chronic right side vision loss, with recent loss of the left side vision and seizure activity. 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 There is abnormal subcortical and central white matter hypointensity throughout the right occipital lobe on axial FLAIR imaging (series 9, image 11). This abnormal decreased FLAIR signal is not found elsewhere throughout the brain. There is probably more subtle associated T2 hypointensity in the right occipital lobe. Axial diffusion imaging also demonstrates subtle increased right occipital cortical trace diffusion (series 3, image 24). CONCLUSION: Abnormal signal in the right occipital lobe, the constellation of which is most compatible with Hyperglycemic Hemianopia and Occipital Seizure. This was discussed with Dr. Roland Rack. Electronically Signed   By: Genevie Ann M.D.   On: 04/28/2017 11:50   Result Date: 04/28/2017 CLINICAL DATA:  67 year old female who presented with seizure, blurred vision and  confusion. EXAM: MRI HEAD WITHOUT CONTRAST MRA HEAD WITHOUT CONTRAST TECHNIQUE: Multiplanar, multiecho pulse sequences of the brain and surrounding structures were obtained without intravenous contrast. Angiographic images of the head were obtained using MRA technique without contrast. COMPARISON:  Head CT without contrast 04/24/2017. FINDINGS: Study performed under general anesthesia. MRI HEAD FINDINGS Brain: No restricted diffusion or evidence of acute infarction. Chronic encephalomalacia affecting the left temporal stem (series 13, image 10) with abundant hemosiderin (series 10, image 49), and involvement of the dorsal left thalamus. Associated gliosis. Numerous additional chronic micro hemorrhages scattered in the brain and concentrated in the deep gray matter nuclei and deep white matter capsules. Additional chronic lacunar infarcts in the bilateral corona radiata, bilateral basal ganglia, lateral right thalamus, and possibly also the pons. No definite cortical encephalomalacia. Normal cerebellum aside from a chronic microhemorrhage in the left deep cerebellar nuclei. The hippocampal formations appear symmetric and within normal limits. No midline shift, mass effect, evidence of mass lesion, ventriculomegaly, extra-axial collection or acute intracranial hemorrhage. Cervicomedullary junction and pituitary are within normal limits. Vascular: Major intracranial vascular flow voids are preserved, the distal right vertebral artery appears dominant. See MRA findings below. Skull and upper cervical spine: Negative visible cervical spine. Normal bone marrow signal. Sinuses/Orbits: Normal orbits soft tissues. Paranasal Visualized paranasal sinuses and mastoids are stable and well pneumatized. Other: Visible internal auditory structures appear normal. Scalp and face soft tissues appear negative. MRA HEAD FINDINGS Antegrade flow in the posterior circulation. Dominant distal right vertebral artery with duplicated right  PICA or right PICA origin (normal variant). No distal right vertebral stenosis. The non dominant left vertebral is diminutive beyond the normal left PICA origin. Patent basilar artery with mild irregularity but no stenosis. Patent SCA and PCA origins. Fetal type left PCA origin. Mild left PCA irregularity, but no PCA stenosis bilaterally. Antegrade flow in both ICA siphons. Mild siphon irregularity but no stenosis. The ophthalmic and posterior communicating artery origins are normal. Patent carotid termini. Normal MCA and ACA origins. Diminutive or absent anterior communicating artery. Visible ACA branches are within normal limits. The left MCA M1 segment is mildly irregular without stenosis. The left MCA bifurcation and visible left MCA branches appear normal. The right MCA M1 segment is mildly irregular without stenosis. The right MCA bifurcation and visible right MCA branches appear normal. IMPRESSION: 1.  No acute intracranial abnormality. 2. Advanced chronic small vessel disease in the deep gray matter nuclei and deep white matter capsules. Prominent chronic infarct with hemosiderin and gliosis centered at the left temporal stem, with involvement  of the dorsal left thalamus and likely involving the left optic radiations. 3. Intracranial atherosclerosis but no hemodynamically significant stenosis or circle of Willis branch occlusion on intracranial MRA. Electronically Signed: By: Genevie Ann M.D. On: 04/28/2017 09:59   Mr Jodene Nam Head Wo Contrast  Addendum Date: 04/28/2017   ADDENDUM REPORT: 04/28/2017 11:50 ADDENDUM: Study discussed by telephone with Dr. Roland Rack on 04/28/2017 at 1130 hours. He advises that this patient has chronic right side vision loss, with recent loss of the left side vision and seizure activity. 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 There is abnormal subcortical and central white matter hypointensity throughout the right occipital lobe on axial FLAIR imaging (series 9, image  11). This abnormal decreased FLAIR signal is not found elsewhere throughout the brain. There is probably more subtle associated T2 hypointensity in the right occipital lobe. Axial diffusion imaging also demonstrates subtle increased right occipital cortical trace diffusion (series 3, image 24). CONCLUSION: Abnormal signal in the right occipital lobe, the constellation of which is most compatible with Hyperglycemic Hemianopia and Occipital Seizure. This was discussed with Dr. Roland Rack. Electronically Signed   By: Genevie Ann M.D.   On: 04/28/2017 11:50   Result Date: 04/28/2017 CLINICAL DATA:  67 year old female who presented with seizure, blurred vision and confusion. EXAM: MRI HEAD WITHOUT CONTRAST MRA HEAD WITHOUT CONTRAST TECHNIQUE: Multiplanar, multiecho pulse sequences of the brain and surrounding structures were obtained without intravenous contrast. Angiographic images of the head were obtained using MRA technique without contrast. COMPARISON:  Head CT without contrast 04/24/2017. FINDINGS: Study performed under general anesthesia. MRI HEAD FINDINGS Brain: No restricted diffusion or evidence of acute infarction. Chronic encephalomalacia affecting the left temporal stem (series 13, image 10) with abundant hemosiderin (series 10, image 49), and involvement of the dorsal left thalamus. Associated gliosis. Numerous additional chronic micro hemorrhages scattered in the brain and concentrated in the deep gray matter nuclei and deep white matter capsules. Additional chronic lacunar infarcts in the bilateral corona radiata, bilateral basal ganglia, lateral right thalamus, and possibly also the pons. No definite cortical encephalomalacia. Normal cerebellum aside from a chronic microhemorrhage in the left deep cerebellar nuclei. The hippocampal formations appear symmetric and within normal limits. No midline shift, mass effect, evidence of mass lesion, ventriculomegaly, extra-axial collection or acute  intracranial hemorrhage. Cervicomedullary junction and pituitary are within normal limits. Vascular: Major intracranial vascular flow voids are preserved, the distal right vertebral artery appears dominant. See MRA findings below. Skull and upper cervical spine: Negative visible cervical spine. Normal bone marrow signal. Sinuses/Orbits: Normal orbits soft tissues. Paranasal Visualized paranasal sinuses and mastoids are stable and well pneumatized. Other: Visible internal auditory structures appear normal. Scalp and face soft tissues appear negative. MRA HEAD FINDINGS Antegrade flow in the posterior circulation. Dominant distal right vertebral artery with duplicated right PICA or right PICA origin (normal variant). No distal right vertebral stenosis. The non dominant left vertebral is diminutive beyond the normal left PICA origin. Patent basilar artery with mild irregularity but no stenosis. Patent SCA and PCA origins. Fetal type left PCA origin. Mild left PCA irregularity, but no PCA stenosis bilaterally. Antegrade flow in both ICA siphons. Mild siphon irregularity but no stenosis. The ophthalmic and posterior communicating artery origins are normal. Patent carotid termini. Normal MCA and ACA origins. Diminutive or absent anterior communicating artery. Visible ACA branches are within normal limits. The left MCA M1 segment is mildly irregular without stenosis. The left MCA bifurcation and visible left MCA branches appear normal. The right  MCA M1 segment is mildly irregular without stenosis. The right MCA bifurcation and visible right MCA branches appear normal. IMPRESSION: 1.  No acute intracranial abnormality. 2. Advanced chronic small vessel disease in the deep gray matter nuclei and deep white matter capsules. Prominent chronic infarct with hemosiderin and gliosis centered at the left temporal stem, with involvement of the dorsal left thalamus and likely involving the left optic radiations. 3. Intracranial  atherosclerosis but no hemodynamically significant stenosis or circle of Willis branch occlusion on intracranial MRA. Electronically Signed: By: Genevie Ann M.D. On: 04/28/2017 09:59     Scheduled Meds: . allopurinol  100 mg Oral BID  . amLODipine  10 mg Oral Daily  . aspirin  300 mg Rectal Daily   Or  . aspirin  325 mg Oral Daily  . brimonidine  1 drop Both Eyes BID   And  . timolol  1 drop Both Eyes BID  . clonazePAM  1 mg Oral QHS  . gabapentin  600 mg Oral BID  . insulin aspart  0-15 Units Subcutaneous TID WC  . insulin aspart  0-5 Units Subcutaneous QHS  . insulin detemir  25 Units Subcutaneous Q2200  . phenytoin (DILANTIN) IV  100 mg Intravenous Q8H  . pravastatin  80 mg Oral QHS  . sertraline  100 mg Oral QHS   Continuous Infusions: . sodium chloride 125 mL/hr at 04/27/17 0030  . lacosamide (VIMPAT) IV Stopped (04/27/17 2310)  . levETIRAcetam Stopped (04/28/17 0559)   Marzetta Board, MD, PhD [facility] Pager [phone]  If 7PM-7AM, please contact night-coverage [email or web address] Password [facility] 04/28/2017, 12:57 PM

## 2017-04-29 DIAGNOSIS — H538 Other visual disturbances: Secondary | ICD-10-CM | POA: Diagnosis not present

## 2017-04-29 DIAGNOSIS — G40901 Epilepsy, unspecified, not intractable, with status epilepticus: Secondary | ICD-10-CM | POA: Diagnosis not present

## 2017-04-29 DIAGNOSIS — N183 Chronic kidney disease, stage 3 (moderate): Secondary | ICD-10-CM | POA: Diagnosis not present

## 2017-04-29 DIAGNOSIS — H510 Palsy (spasm) of conjugate gaze: Secondary | ICD-10-CM | POA: Diagnosis not present

## 2017-04-29 DIAGNOSIS — R4182 Altered mental status, unspecified: Secondary | ICD-10-CM | POA: Diagnosis not present

## 2017-04-29 DIAGNOSIS — G9340 Encephalopathy, unspecified: Secondary | ICD-10-CM | POA: Diagnosis not present

## 2017-04-29 LAB — GLUCOSE, CAPILLARY
GLUCOSE-CAPILLARY: 126 mg/dL — AB (ref 65–99)
Glucose-Capillary: 181 mg/dL — ABNORMAL HIGH (ref 65–99)
Glucose-Capillary: 144 mg/dL — ABNORMAL HIGH (ref 65–99)
Glucose-Capillary: 246 mg/dL — ABNORMAL HIGH (ref 65–99)

## 2017-04-29 LAB — BASIC METABOLIC PANEL
Anion gap: 13 (ref 5–15)
BUN: 12 mg/dL (ref 6–20)
CALCIUM: 8.9 mg/dL (ref 8.9–10.3)
CO2: 21 mmol/L — AB (ref 22–32)
CREATININE: 1.31 mg/dL — AB (ref 0.44–1.00)
Chloride: 106 mmol/L (ref 101–111)
GFR calc Af Amer: 47 mL/min — ABNORMAL LOW (ref >60)
GFR calc non Af Amer: 41 mL/min — ABNORMAL LOW (ref >60)
GLUCOSE: 164 mg/dL — AB (ref 65–99)
Potassium: 3.5 mmol/L (ref 3.5–5.1)
Sodium: 140 mmol/L (ref 135–145)

## 2017-04-29 LAB — CBC
HCT: 39.9 % (ref 36.0–46.0)
Hemoglobin: 12.1 g/dL (ref 12.0–15.0)
MCH: 26.4 pg (ref 26.0–34.0)
MCHC: 30.3 g/dL (ref 30.0–36.0)
MCV: 86.9 fL (ref 78.0–100.0)
PLATELETS: 243 10*3/uL (ref 150–400)
RBC: 4.59 MIL/uL (ref 3.87–5.11)
RDW: 15.8 % — AB (ref 11.5–15.5)
WBC: 5.9 10*3/uL (ref 4.0–10.5)

## 2017-04-29 MED ORDER — LACOSAMIDE 50 MG PO TABS
150.0000 mg | ORAL_TABLET | Freq: Two times a day (BID) | ORAL | Status: DC
  Administered 2017-04-29 – 2017-05-01 (×5): 150 mg via ORAL
  Filled 2017-04-29 (×5): qty 3

## 2017-04-29 MED ORDER — PHENYTOIN 50 MG PO CHEW
100.0000 mg | CHEWABLE_TABLET | Freq: Three times a day (TID) | ORAL | Status: DC
  Administered 2017-04-29 – 2017-05-01 (×7): 100 mg via ORAL
  Filled 2017-04-29 (×9): qty 2

## 2017-04-29 MED ORDER — LEVETIRACETAM 500 MG PO TABS
1000.0000 mg | ORAL_TABLET | Freq: Two times a day (BID) | ORAL | Status: DC
  Administered 2017-04-29 – 2017-05-01 (×4): 1000 mg via ORAL
  Filled 2017-04-29 (×5): qty 2

## 2017-04-29 NOTE — Progress Notes (Signed)
PROGRESS NOTE  MIN TUNNELL VHQ:[id number] DOB: 1950-05-04 DOA: 04/24/2017 PCP: Benito Mccreedy, MD   LOS: 5 days   Brief Narrative / Interim history: 67 year old female with diabetes, hypertension, obesity, chronic kidney disease stage III with baseline creatinine 1.4, sick sinus syndrome currently with no pacer, history of CVA who presents with headache, vision loss, and confusion.  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  Underwent continuous EEG which appears to be negative for further seizures.  Management with AEDs per neurology -She underwent MRI with general anesthesia at this morning, and it did show abnormal signal in the right occipital lobe compatible with hyperglycemia hemianopia, in addition it was negative for acute CVA -Continue aspirin -2D echo with normal LV size with mild LV hypertrophy, EF 55-60% -CBG control is of utmost importance  Acute metabolic encephalopathy in the setting of status epilepticus -EEG on 4/2 showed status epilepticus, patient was given Keppra, Dilantin, Vimpat - continuous EEG monitoring showed that this regimen adequately  suppresses epileptiform discharges -clinically improving    Hypertension -Continue Norvasc  Diabetes mellitus, poorly controlled, with polyneuropathy -Continue Levemir and sliding scale, CBG this morning 164, continue gabapentin for neuropathy -Most recent hemoglobin A1c a few days ago 11.3  CKD stage III -Creatinine overall stable  Gout -quiescent, continue  allopurinol  Glaucoma -Continue eye drops  Anxiety Mood disorder -Keep on home regimen   Disposition  -Awaiting physical therapy evaluation, may need SNF as she is quite weak  DVT prophylaxis: SCDs Code Status: Full code Family Communication: Discussed with daughter at bedside Disposition Plan: Home when ready  Consultants:   Neurology  Procedures:   2D echo Impressions:  - Normal LV size with mild LV hypertrophy. EF 55-60%. Normal RV size and systolic function. No significant valvular abnormalities.   EEG EEG Abnormalities: 1) 3 partial seizures lasting 1.5-2 minutes arising from the right parietooccipital region Clinical Interpretation: This EEG recorded multiple seizures arising from the right parieto-occipital region.   This was communicated with the neurologist seeing her today and she was continued on long-term monitoring.  Antimicrobials:  None    Subjective: -No complaints this morning, no chest pain, no shortness of breath  Objective: Vitals:   04/28/17 1130 04/28/17 2059 04/28/17 2332 04/29/17 0605  BP: 122/85 (!) 172/96 (!) 146/82 (!) 163/75  Pulse: 65 83 66 62  Resp: 20 18 16 18   Temp: 98.2 F (36.8 C) 98.2 F (36.8 C) 99 F (37.2 C) 98.4 F (36.9 C)  TempSrc: Oral Oral Oral Oral  SpO2: 100% 100% 97% 98%  Weight:      Height:        Intake/Output Summary (Last 24 hours) at 04/29/2017 1321 Last data filed at 04/29/2017 0535 Gross per 24 hour  Intake 5736.67 ml  Output 1000 ml  Net 4736.67 ml   Filed Weights   04/24/17 1714  Weight: 83.9 kg (185 lb)    Examination:  Constitutional: No distress Respiratory: CTA Cardiovascular: RRR   Data Reviewed: I have independently reviewed following labs and imaging studies   CBC: Recent Labs  Lab  04/24/17 1805 04/26/17 1042 04/27/17 0410 04/28/17 0634 04/29/17 0220  WBC 9.4 8.2 6.1 5.6 5.9  HGB 14.5 13.5 13.0 14.1 12.1  HCT 43.4 42.6 41.5 44.2 39.9  MCV 83.6 85.9 86.5 85.7 86.9   PLT 305 254 242 275 637   Basic Metabolic Panel: Recent Labs  Lab 04/25/17 1528 04/26/17 1042 04/27/17 0410 04/27/17 2033 04/28/17 0634 04/29/17 0220  NA 139 139 137  --  139 140  K 3.1* 2.9* 2.9*  --  3.5 3.5  CL 98* 102 101  --  103 106  CO2 27 26 27   --  23 21*  GLUCOSE 203* 201* 188*  --  167* 164*  BUN 24* 22* 17  --  10 12  CREATININE 1.53* 1.48* 1.36*  --  1.09* 1.31*  CALCIUM 9.4 8.7* 8.7*  --  9.0 8.9  MG  --   --   --  2.0  --   --    GFR: Estimated Creatinine Clearance: 44 mL/min (A) (by C-G formula based on SCr of 1.31 mg/dL (H)). Liver Function Tests: Recent Labs  Lab 04/24/17 1730  AST 22  ALT 14  ALKPHOS 131*  BILITOT 1.6*  PROT 8.1  ALBUMIN 3.9   Recent Labs  Lab 04/24/17 1730  LIPASE 33   No results for input(s): AMMONIA in the last 168 hours. Coagulation Profile: Recent Labs  Lab 04/24/17 2014  INR 1.19   Cardiac Enzymes: Recent Labs  Lab 04/25/17 0000 04/25/17 1528  TROPONINI 0.05* 0.06*   BNP (last 3 results) No results for input(s): PROBNP in the last 8760 hours. HbA1C: No results for input(s): HGBA1C in the last 72 hours. CBG: Recent Labs  Lab 04/28/17 1108 04/28/17 1634 04/28/17 2232 04/29/17 0759 04/29/17 1146  GLUCAP 181* 174* 164* 126* 181*   Lipid Profile: No results for input(s): CHOL, HDL, LDLCALC, TRIG, CHOLHDL, LDLDIRECT in the last 72 hours. Thyroid Function Tests: No results for input(s): TSH, T4TOTAL, FREET4, T3FREE, THYROIDAB in the last 72 hours. Anemia Panel: No results for input(s): VITAMINB12, FOLATE, FERRITIN, TIBC, IRON, RETICCTPCT in the last 72 hours. Urine analysis:    Component Value Date/Time   COLORURINE YELLOW 04/24/2017 1759   APPEARANCEUR CLEAR 04/24/2017 1759   LABSPEC 1.026 04/24/2017 1759   PHURINE 6.0 04/24/2017 1759   GLUCOSEU >=500 (A) 04/24/2017 1759   HGBUR SMALL (A) 04/24/2017 1759   HGBUR negative 07/07/2009 0957   BILIRUBINUR NEGATIVE 04/24/2017 1759   KETONESUR NEGATIVE  04/24/2017 1759   PROTEINUR >=300 (A) 04/24/2017 1759   UROBILINOGEN 1.0 10/01/2013 0915   NITRITE NEGATIVE 04/24/2017 1759   LEUKOCYTESUR SMALL (A) 04/24/2017 1759   Sepsis Labs: Invalid input(s): PROCALCITONIN, LACTICIDVEN  Recent Results (from the past 240 hour(s))  Urine culture     Status: Abnormal   Collection Time: 04/24/17 11:14 PM  Result Value Ref Range Status   Specimen Description   Final    URINE, RANDOM Performed at [facility] [address], [place], [place] [address]    Special Requests   Final    NONE Performed at [facility], [place] [address], [place], [place] [address]    Culture MULTIPLE SPECIES PRESENT, SUGGEST RECOLLECTION (A)  Final   Report Status 04/26/2017 FINAL  Final      Radiology Studies: Mr Brain Wo Contrast  Addendum Date: 04/28/2017   ADDENDUM REPORT: 04/28/2017 11:50 ADDENDUM: Study discussed by telephone with Dr. Roland Rack on 04/28/2017 at 1130 hours. He advises that  this patient has chronic right side vision loss, with recent loss of the left side vision and seizure activity. 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 There is abnormal subcortical and central white matter hypointensity throughout the right occipital lobe on axial FLAIR imaging (series 9, image 11). This abnormal decreased FLAIR signal is not found elsewhere throughout the brain. There is probably more subtle associated T2 hypointensity in the right occipital lobe. Axial diffusion imaging also demonstrates subtle increased right occipital cortical trace diffusion (series 3, image 24). CONCLUSION: Abnormal signal in the right occipital lobe, the constellation of which is most compatible with Hyperglycemic Hemianopia and Occipital Seizure. This was discussed with Dr. Roland Rack. Electronically Signed   By: Genevie Ann M.D.   On: 04/28/2017 11:50   Result Date: 04/28/2017 CLINICAL DATA:  67 year old female who presented with  seizure, blurred vision and confusion. EXAM: MRI HEAD WITHOUT CONTRAST MRA HEAD WITHOUT CONTRAST TECHNIQUE: Multiplanar, multiecho pulse sequences of the brain and surrounding structures were obtained without intravenous contrast. Angiographic images of the head were obtained using MRA technique without contrast. COMPARISON:  Head CT without contrast 04/24/2017. FINDINGS: Study performed under general anesthesia. MRI HEAD FINDINGS Brain: No restricted diffusion or evidence of acute infarction. Chronic encephalomalacia affecting the left temporal stem (series 13, image 10) with abundant hemosiderin (series 10, image 49), and involvement of the dorsal left thalamus. Associated gliosis. Numerous additional chronic micro hemorrhages scattered in the brain and concentrated in the deep gray matter nuclei and deep white matter capsules. Additional chronic lacunar infarcts in the bilateral corona radiata, bilateral basal ganglia, lateral right thalamus, and possibly also the pons. No definite cortical encephalomalacia. Normal cerebellum aside from a chronic microhemorrhage in the left deep cerebellar nuclei. The hippocampal formations appear symmetric and within normal limits. No midline shift, mass effect, evidence of mass lesion, ventriculomegaly, extra-axial collection or acute intracranial hemorrhage. Cervicomedullary junction and pituitary are within normal limits. Vascular: Major intracranial vascular flow voids are preserved, the distal right vertebral artery appears dominant. See MRA findings below. Skull and upper cervical spine: Negative visible cervical spine. Normal bone marrow signal. Sinuses/Orbits: Normal orbits soft tissues. Paranasal Visualized paranasal sinuses and mastoids are stable and well pneumatized. Other: Visible internal auditory structures appear normal. Scalp and face soft tissues appear negative. MRA HEAD FINDINGS Antegrade flow in the posterior circulation. Dominant distal right vertebral  artery with duplicated right PICA or right PICA origin (normal variant). No distal right vertebral stenosis. The non dominant left vertebral is diminutive beyond the normal left PICA origin. Patent basilar artery with mild irregularity but no stenosis. Patent SCA and PCA origins. Fetal type left PCA origin. Mild left PCA irregularity, but no PCA stenosis bilaterally. Antegrade flow in both ICA siphons. Mild siphon irregularity but no stenosis. The ophthalmic and posterior communicating artery origins are normal. Patent carotid termini. Normal MCA and ACA origins. Diminutive or absent anterior communicating artery. Visible ACA branches are within normal limits. The left MCA M1 segment is mildly irregular without stenosis. The left MCA bifurcation and visible left MCA branches appear normal. The right MCA M1 segment is mildly irregular without stenosis. The right MCA bifurcation and visible right MCA branches appear normal. IMPRESSION: 1.  No acute intracranial abnormality. 2. Advanced chronic small vessel disease in the deep gray matter nuclei and deep white matter capsules. Prominent chronic infarct with hemosiderin and gliosis centered at the left temporal stem, with involvement of the dorsal left thalamus and likely involving the left optic radiations. 3. Intracranial atherosclerosis  but no hemodynamically significant stenosis or circle of Willis branch occlusion on intracranial MRA. Electronically Signed: By: Genevie Ann M.D. On: 04/28/2017 09:59   Mr Jodene Nam Head Wo Contrast  Addendum Date: 04/28/2017   ADDENDUM REPORT: 04/28/2017 11:50 ADDENDUM: Study discussed by telephone with Dr. Roland Rack on 04/28/2017 at 1130 hours. He advises that this patient has chronic right side vision loss, with recent loss of the left side vision and seizure activity. 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 There is abnormal subcortical and central white matter hypointensity throughout the right occipital lobe on axial  FLAIR imaging (series 9, image 11). This abnormal decreased FLAIR signal is not found elsewhere throughout the brain. There is probably more subtle associated T2 hypointensity in the right occipital lobe. Axial diffusion imaging also demonstrates subtle increased right occipital cortical trace diffusion (series 3, image 24). CONCLUSION: Abnormal signal in the right occipital lobe, the constellation of which is most compatible with Hyperglycemic Hemianopia and Occipital Seizure. This was discussed with Dr. Roland Rack. Electronically Signed   By: Genevie Ann M.D.   On: 04/28/2017 11:50   Result Date: 04/28/2017 CLINICAL DATA:  67 year old female who presented with seizure, blurred vision and confusion. EXAM: MRI HEAD WITHOUT CONTRAST MRA HEAD WITHOUT CONTRAST TECHNIQUE: Multiplanar, multiecho pulse sequences of the brain and surrounding structures were obtained without intravenous contrast. Angiographic images of the head were obtained using MRA technique without contrast. COMPARISON:  Head CT without contrast 04/24/2017. FINDINGS: Study performed under general anesthesia. MRI HEAD FINDINGS Brain: No restricted diffusion or evidence of acute infarction. Chronic encephalomalacia affecting the left temporal stem (series 13, image 10) with abundant hemosiderin (series 10, image 49), and involvement of the dorsal left thalamus. Associated gliosis. Numerous additional chronic micro hemorrhages scattered in the brain and concentrated in the deep gray matter nuclei and deep white matter capsules. Additional chronic lacunar infarcts in the bilateral corona radiata, bilateral basal ganglia, lateral right thalamus, and possibly also the pons. No definite cortical encephalomalacia. Normal cerebellum aside from a chronic microhemorrhage in the left deep cerebellar nuclei. The hippocampal formations appear symmetric and within normal limits. No midline shift, mass effect, evidence of mass lesion, ventriculomegaly,  extra-axial collection or acute intracranial hemorrhage. Cervicomedullary junction and pituitary are within normal limits. Vascular: Major intracranial vascular flow voids are preserved, the distal right vertebral artery appears dominant. See MRA findings below. Skull and upper cervical spine: Negative visible cervical spine. Normal bone marrow signal. Sinuses/Orbits: Normal orbits soft tissues. Paranasal Visualized paranasal sinuses and mastoids are stable and well pneumatized. Other: Visible internal auditory structures appear normal. Scalp and face soft tissues appear negative. MRA HEAD FINDINGS Antegrade flow in the posterior circulation. Dominant distal right vertebral artery with duplicated right PICA or right PICA origin (normal variant). No distal right vertebral stenosis. The non dominant left vertebral is diminutive beyond the normal left PICA origin. Patent basilar artery with mild irregularity but no stenosis. Patent SCA and PCA origins. Fetal type left PCA origin. Mild left PCA irregularity, but no PCA stenosis bilaterally. Antegrade flow in both ICA siphons. Mild siphon irregularity but no stenosis. The ophthalmic and posterior communicating artery origins are normal. Patent carotid termini. Normal MCA and ACA origins. Diminutive or absent anterior communicating artery. Visible ACA branches are within normal limits. The left MCA M1 segment is mildly irregular without stenosis. The left MCA bifurcation and visible left MCA branches appear normal. The right MCA M1 segment is mildly irregular without stenosis. The right MCA bifurcation and visible right  MCA branches appear normal. IMPRESSION: 1.  No acute intracranial abnormality. 2. Advanced chronic small vessel disease in the deep gray matter nuclei and deep white matter capsules. Prominent chronic infarct with hemosiderin and gliosis centered at the left temporal stem, with involvement of the dorsal left thalamus and likely involving the left optic  radiations. 3. Intracranial atherosclerosis but no hemodynamically significant stenosis or circle of Willis branch occlusion on intracranial MRA. Electronically Signed: By: Genevie Ann M.D. On: 04/28/2017 09:59     Scheduled Meds: . allopurinol  100 mg Oral BID  . amLODipine  10 mg Oral Daily  . aspirin  300 mg Rectal Daily   Or  . aspirin  325 mg Oral Daily  . brimonidine  1 drop Both Eyes BID   And  . timolol  1 drop Both Eyes BID  . clonazePAM  1 mg Oral QHS  . gabapentin  600 mg Oral BID  . insulin aspart  0-15 Units Subcutaneous TID WC  . insulin aspart  0-5 Units Subcutaneous QHS  . insulin detemir  25 Units Subcutaneous Q2200  . lacosamide  150 mg Oral BID  . levETIRAcetam  1,000 mg Oral BID  . phenytoin  100 mg Oral Q8H  . pravastatin  80 mg Oral QHS  . sertraline  100 mg Oral QHS   Continuous Infusions:  Marzetta Board, MD, PhD [facility] Pager [phone]  If 7PM-7AM, please contact night-coverage [email or web address] Password [email or web address] 04/29/2017, 1:21 PM

## 2017-04-29 NOTE — Progress Notes (Signed)
Subjective: Continues to improve  Exam: Vitals:   04/29/17 0605 04/29/17 1446  BP: (!) 163/75 138/88  Pulse: 62 75  Resp: 18 18  Temp: 98.4 F (36.9 C) 98.6 F (37 C)  SpO2: 98% 98%   Gen: In bed, NAD Resp: non-labored breathing, no acute distress Abd: soft, nt  Neuro: MS: Awake, alert, interactive and appropriate CN: She has a RIGHT hemianopia, face symmetric Motor: She has good strength throughout Sensory: Intact light touch, no extinction  MRI reviewed- T2 hypointensity in the right occipital region  Impression: 67 year old female presenting with recurrent focal seizures without return to baseline in between(status epilepticus) in the right occipital region.  She is requiring 4 drug regimen(5 if Klonopin is counted), but has greatly improved both clinically and electrographically.  Her clinical course of presenting with hyperglycemia, refractory seizures causing hemianopia, imaging findings with T2 hypointensity in the right occipital region are all most consistent with hyperglycemic hemianopia[1].  Glucose control will play an important role in seizure prevention in her from this point forward, especially since she was only in the 400s on presentation.  I do not think she will need lifelong for drug regimen, but given the difficulty that we had and stopping the seizures I would favor continuing this for the time being.  Possibly in several months, would consider tapering her down.  Recommendations: 1) glucose control 2) continue LTM EEG, but will have to be disconnected for a period of time to go to MRI 3) continue gabapentin 600 mg twice daily 4) continue Vimpat 150 mg twice daily 5) continue Keppra 1 g twice daily(maximum renal dose) 6) continue Dilantin 100 mg every 8 hours 7) neurology will sign off at this time, she will need outpatient follow-up with neurology.  Please call with further questions or concerns.  [1]  [email or web address]    Roland Rack, MD [facility] [phone]  If 7pm- 7am, please page neurology on call as listed in [place].

## 2017-04-29 NOTE — Plan of Care (Signed)
Progressing

## 2017-04-30 DIAGNOSIS — H538 Other visual disturbances: Secondary | ICD-10-CM | POA: Diagnosis not present

## 2017-04-30 DIAGNOSIS — G40901 Epilepsy, unspecified, not intractable, with status epilepticus: Secondary | ICD-10-CM | POA: Diagnosis not present

## 2017-04-30 DIAGNOSIS — N183 Chronic kidney disease, stage 3 (moderate): Secondary | ICD-10-CM | POA: Diagnosis not present

## 2017-04-30 DIAGNOSIS — G9340 Encephalopathy, unspecified: Secondary | ICD-10-CM | POA: Diagnosis not present

## 2017-04-30 DIAGNOSIS — R4182 Altered mental status, unspecified: Secondary | ICD-10-CM | POA: Diagnosis not present

## 2017-04-30 LAB — BASIC METABOLIC PANEL
ANION GAP: 13 (ref 5–15)
BUN: 10 mg/dL (ref 6–20)
CALCIUM: 9.2 mg/dL (ref 8.9–10.3)
CO2: 22 mmol/L (ref 22–32)
Chloride: 104 mmol/L (ref 101–111)
Creatinine, Ser: 1.14 mg/dL — ABNORMAL HIGH (ref 0.44–1.00)
GFR calc non Af Amer: 48 mL/min — ABNORMAL LOW (ref >60)
GFR, EST AFRICAN AMERICAN: 56 mL/min — AB (ref >60)
GLUCOSE: 167 mg/dL — AB (ref 65–99)
Potassium: 3.3 mmol/L — ABNORMAL LOW (ref 3.5–5.1)
Sodium: 139 mmol/L (ref 135–145)

## 2017-04-30 LAB — GLUCOSE, CAPILLARY
GLUCOSE-CAPILLARY: 134 mg/dL — AB (ref 65–99)
GLUCOSE-CAPILLARY: 288 mg/dL — AB (ref 65–99)
Glucose-Capillary: 284 mg/dL — ABNORMAL HIGH (ref 65–99)
Glucose-Capillary: 250 mg/dL — ABNORMAL HIGH (ref 65–99)

## 2017-04-30 LAB — CBC
HCT: 40.5 % (ref 36.0–46.0)
HEMOGLOBIN: 12.7 g/dL (ref 12.0–15.0)
MCH: 26.6 pg (ref 26.0–34.0)
MCHC: 31.4 g/dL (ref 30.0–36.0)
MCV: 84.7 fL (ref 78.0–100.0)
PLATELETS: 275 10*3/uL (ref 150–400)
RBC: 4.78 MIL/uL (ref 3.87–5.11)
RDW: 15.1 % (ref 11.5–15.5)
WBC: 4.2 10*3/uL (ref 4.0–10.5)

## 2017-04-30 LAB — PHENYTOIN LEVEL, TOTAL: Phenytoin Lvl: 12.6 ug/mL (ref 10.0–20.0)

## 2017-04-30 NOTE — Progress Notes (Signed)
Physical Therapy Treatment Patient Details Name: Caitlin Morris MRN: [id number] DOB: September 29, 1950 Today's Date: 04/30/2017    History of Present Illness Pt. is a 67 y.o. F with significant PMH of diabetes, hypertension, hyperlipidemia, sick sinus syndrome, previous CVA with residual left sided weakness, and CKD. Presenting with 6 days of nausea/vomiting, occasional headaches, confusion and blurry vision.  CT negative for acute changes and ntoed small vessel ischemic changes of the white matter with old appearing bilateral lacunar infarcts.     PT Comments    Patient see for activity progression, noted improvements compared to previous session but continues to require increased physical assist for most all aspects of mobility. Patient was abe to ambulate with RW/assist and was engaged and eager to progress.  At this time, continue to feel ST SNF is appropriate to maximize functional recovery and safety with mobility. Patient and daughter in agreement. Will continue to see and progress as tolerated.  Follow Up Recommendations  SNF     Equipment Recommendations  Other (comment)(Defer to SNF)    Recommendations for Other Services       Precautions / Restrictions Precautions Precautions: Fall Restrictions Weight Bearing Restrictions: No    Mobility  Bed Mobility Overal bed mobility: Needs Assistance Bed Mobility: Supine to Sit       Sit to supine: Mod assist   General bed mobility comments: moderate assist to elevate trunk and rotate hips to EOb  Transfers Overall transfer level: Needs assistance Equipment used: Rolling walker (2 wheeled) Transfers: Sit to/from Stand Sit to Stand: Mod assist         General transfer comment: Vcs for hand placement and positioning, moderate assist to translate hips forward and power up to standing with RW  Ambulation/Gait Ambulation/Gait assistance: Min assist Ambulation Distance (Feet): 80 Feet(x2) Assistive device: Rolling walker (2  wheeled) Gait Pattern/deviations: Step-to pattern;Decreased stride length;Shuffle;Wide base of support;Drifts right/left;Trunk flexed;Decreased weight shift to right Gait velocity: patient with poor coordination of RLE, multi modal cues for increased stride, cadence and posture. Manual assist to control RW and maintain stability. 3 standing rest breaks required with manual assist to reaffirm hand placement and positioning Gait velocity interpretation: <1.8 ft/sec, indicative of risk for recurrent falls General Gait Details: Not safe at this time.    Stairs            Wheelchair Mobility    Modified Rankin (Stroke Patients Only) Modified Rankin (Stroke Patients Only) Pre-Morbid Rankin Score: Slight disability Modified Rankin: Severe disability     Balance Overall balance assessment: Needs assistance Sitting-balance support: Bilateral upper extremity supported;Feet supported Sitting balance-Leahy Scale: Fair Sitting balance - Comments: able to sit EOB without physical assist                                    Cognition Arousal/Alertness: Awake/alert Behavior During Therapy: WFL for tasks assessed/performed Overall Cognitive Status: Impaired/Different from baseline Area of Impairment: Following commands;Problem solving;Attention                   Current Attention Level: Selective   Following Commands: Follows one step commands consistently;Follows one step commands with increased time     Problem Solving: Slow processing;Requires verbal cues;Requires tactile cues General Comments: increased time and effort to perform activity, multi modal cues during ambulation for safety and attention to task      Exercises      General Comments  Pertinent Vitals/Pain Faces Pain Scale: No hurt    Home Living                      Prior Function            PT Goals (current goals can now be found in the care plan section) Acute Rehab PT  Goals Patient Stated Goal: "to get herself in order" Progress towards PT goals: Progressing toward goals    Frequency    Min 3X/week      PT Plan Current plan remains appropriate    Co-evaluation              AM-PAC PT "6 Clicks" Daily Activity  Outcome Measure  Difficulty turning over in bed (including adjusting bedclothes, sheets and blankets)?: Unable Difficulty moving from lying on back to sitting on the side of the bed? : Unable Difficulty sitting down on and standing up from a chair with arms (e.g., wheelchair, bedside commode, etc,.)?: Unable Help needed moving to and from a bed to chair (including a wheelchair)?: A Lot Help needed walking in hospital room?: A Little Help needed climbing 3-5 steps with a railing? : Total 6 Click Score: 9    End of Session Equipment Utilized During Treatment: Gait belt Activity Tolerance: Patient tolerated treatment well;Patient limited by fatigue Patient left: in bed;with call bell/phone within reach;with family/visitor present(sitting EOB, family present) Nurse Communication: Mobility status PT Visit Diagnosis: Hemiplegia and hemiparesis;Difficulty in walking, not elsewhere classified (R26.2);Other abnormalities of gait and mobility (R26.89) Hemiplegia - Right/Left: Left     Time: 1157-2620 PT Time Calculation (min) (ACUTE ONLY): 21 min  Charges:  $Gait Training: 8-22 mins                    G Codes:       Alben Deeds, PT DPT  Board Certified Neurologic Specialist [place] 04/30/2017, 2:00 PM

## 2017-04-30 NOTE — Plan of Care (Signed)
  Problem: Self-Care: Goal: Ability to participate in self-care as condition permits will improve Outcome: Progressing Pt and daughter agreeable to participate in ADL's.  Daughter actively involved in pt hygiene via washing pt's hair.  Patient able to demonstrate meal set up via prepping breakfast tray.

## 2017-04-30 NOTE — Progress Notes (Signed)
PROGRESS NOTE  Caitlin Morris ASN:[id number] DOB: 08/21/1950 DOA: 04/24/2017 PCP: Benito Mccreedy, MD   LOS: 6 days   Brief Narrative / Interim history: 67 year old female with diabetes, hypertension, obesity, chronic kidney disease stage III with baseline creatinine 1.4, sick sinus syndrome currently with no pacer, history of CVA who presents with headache, vision loss, and confusion.  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  Underwent continuous EEG which appears to be negative for further seizures.  Management with AEDs per neurology -She underwent MRI with general anesthesia at this morning, and it did show abnormal signal in the right occipital lobe compatible with hyperglycemia hemianopia, in addition it was negative for acute CVA -Continue aspirin -2D echo with normal LV size with mild LV hypertrophy, EF 55-60% -CBG control is of utmost importance  Acute metabolic encephalopathy in the setting of status epilepticus -EEG on 4/2 showed status epilepticus, patient was given Keppra, Dilantin, Vimpat - continuous EEG monitoring showed that this regimen adequately  suppresses epileptiform discharges -clinically improving, was transitioned to p.o. Antiepileptics  Hypertension -Continue Norvasc  Diabetes mellitus, poorly controlled, with polyneuropathy -Continue Levemir and sliding scale, CBG this morning 164, continue gabapentin for neuropathy -Most recent hemoglobin A1c a few days ago 11.3  CKD stage III -Creatinine overall  stable  Gout -quiescent, continue allopurinol  Glaucoma -Continue eye drops  Anxiety Mood disorder -Keep on home regimen   Disposition  -Awaiting repeat physical therapy evaluation, may need SNF as she is quite weak discussed with Education officer, museum  DVT prophylaxis: SCDs Code Status: Full code Family Communication: No family at bedside Disposition Plan: SNF when bed available  Consultants:   Neurology  Procedures:   2D echo Impressions:  - Normal LV size with mild LV hypertrophy. EF 55-60%. Normal RV size and systolic function. No significant valvular abnormalities.   EEG EEG Abnormalities: 1) 3 partial seizures lasting 1.5-2 minutes arising from the right parietooccipital region Clinical Interpretation: This EEG recorded multiple seizures arising from the right parieto-occipital region.   This was communicated with the neurologist seeing her today and she was continued on long-term monitoring.  Antimicrobials:  None    Subjective: - no chest pain, shortness of breath, no abdominal pain, nausea or vomiting.    Objective: Vitals:   04/30/17 0030 04/30/17 0629 04/30/17 0633 04/30/17 1203  BP: (!) 122/92 (!) 143/121 (!) 158/85 101/68  Pulse: 64 70 70 63  Resp: 16 16  14   Temp:  97.7 F (36.5 C)  97.9 F (36.6 C)  TempSrc:  Oral  Oral  SpO2: 100% 99%  99%  Weight:      Height:        Intake/Output Summary (Last 24 hours) at 04/30/2017 1250 Last data filed at 04/30/2017 0911 Gross per 24 hour  Intake 360 ml  Output 400 ml  Net -40 ml   Filed Weights   04/24/17 1714  Weight: 83.9 kg (185 lb)    Examination:  Constitutional: NAD Respiratory: CTA Cardiovascular: RRR   Data Reviewed: I have independently reviewed following labs and imaging studies  CBC: Recent Labs  Lab 04/26/17 1042 04/27/17 0410 04/28/17 0634 04/29/17 0220 04/30/17 0603  WBC 8.2 6.1 5.6 5.9 4.2  HGB 13.5 13.0 14.1 12.1 12.7  HCT 42.6 41.5 44.2 39.9 40.5  MCV 85.9 86.5  85.7 86.9 84.7  PLT 254 242 275 243 277   Basic Metabolic Panel: Recent Labs  Lab 04/26/17 1042 04/27/17 0410 04/27/17 2033 04/28/17 0634 04/29/17 0220 04/30/17 0603  NA 139 137  --  139 140 139  K 2.9* 2.9*  --  3.5 3.5 3.3*  CL 102 101  --  103 106 104  CO2 26 27  --  23 21* 22  GLUCOSE 201* 188*  --  167* 164* 167*  BUN 22* 17  --  10 12 10   CREATININE 1.48* 1.36*  --  1.09* 1.31* 1.14*  CALCIUM 8.7* 8.7*  --  9.0 8.9 9.2  MG  --   --  2.0  --   --   --    GFR: Estimated Creatinine Clearance: 50.6 mL/min (A) (by C-G formula based on SCr of 1.14 mg/dL (H)). Liver Function Tests: Recent Labs  Lab 04/24/17 1730  AST 22  ALT 14  ALKPHOS 131*  BILITOT 1.6*  PROT 8.1  ALBUMIN 3.9   Recent Labs  Lab 04/24/17 1730  LIPASE 33   No results for input(s): AMMONIA in the last 168 hours. Coagulation Profile: Recent Labs  Lab 04/24/17 2014  INR 1.19   Cardiac Enzymes: Recent Labs  Lab 04/25/17 0000 04/25/17 1528  TROPONINI 0.05* 0.06*   BNP (last 3 results) No results for input(s): PROBNP in the last 8760 hours. HbA1C: No results for input(s): HGBA1C in the last 72 hours. CBG: Recent Labs  Lab 04/29/17 1146 04/29/17 1724 04/29/17 2101 04/30/17 0807 04/30/17 1206  GLUCAP 181* 144* 246* 134* 284*   Lipid Profile: No results for input(s): CHOL, HDL, LDLCALC, TRIG, CHOLHDL, LDLDIRECT in the last 72 hours. Thyroid Function Tests: No results for input(s): TSH, T4TOTAL, FREET4, T3FREE, THYROIDAB in the last 72 hours. Anemia Panel: No results for input(s): VITAMINB12, FOLATE, FERRITIN, TIBC, IRON, RETICCTPCT in the last 72 hours. Urine analysis:    Component Value Date/Time   COLORURINE YELLOW 04/24/2017 1759   APPEARANCEUR CLEAR 04/24/2017 1759   LABSPEC 1.026 04/24/2017 1759   PHURINE 6.0 04/24/2017 1759   GLUCOSEU >=500 (A) 04/24/2017 1759   HGBUR SMALL (A) 04/24/2017 1759   HGBUR negative 07/07/2009 0957   BILIRUBINUR NEGATIVE 04/24/2017 1759    KETONESUR NEGATIVE 04/24/2017 1759   PROTEINUR >=300 (A) 04/24/2017 1759   UROBILINOGEN 1.0 10/01/2013 0915   NITRITE NEGATIVE 04/24/2017 1759   LEUKOCYTESUR SMALL (A) 04/24/2017 1759   Sepsis Labs: Invalid input(s): PROCALCITONIN, LACTICIDVEN  Recent Results (from the past 240 hour(s))  Urine culture     Status: Abnormal   Collection Time: 04/24/17 11:14 PM  Result Value Ref Range Status   Specimen Description   Final    URINE, RANDOM Performed at [facility] [address] [place], [place] [address]    Special Requests   Final    NONE Performed at [facility], [place] [address] [place], [place] [address]    Culture MULTIPLE SPECIES PRESENT, SUGGEST RECOLLECTION (A)  Final   Report Status 04/26/2017 FINAL  Final      Radiology Studies: No results found.   Scheduled Meds: . allopurinol  100 mg Oral BID  . amLODipine  10 mg Oral Daily  . aspirin  300 mg Rectal Daily   Or  . aspirin  325 mg Oral Daily  . brimonidine  1 drop Both Eyes BID   And  . timolol  1 drop Both Eyes BID  . clonazePAM  1 mg Oral QHS  . gabapentin  600 mg Oral BID  . insulin aspart  0-15 Units Subcutaneous TID WC  . insulin aspart  0-5 Units Subcutaneous QHS  . insulin detemir  25 Units Subcutaneous Q2200  . lacosamide  150 mg Oral BID  . levETIRAcetam  1,000 mg Oral BID  . phenytoin  100 mg Oral Q8H  . pravastatin  80 mg Oral QHS  . sertraline  100 mg Oral QHS   Continuous Infusions:  Marzetta Board, MD, PhD [facility] Pager [phone]  If 7PM-7AM, please contact night-coverage [email or web address] Password [email or web address] 04/30/2017, 12:50 PM

## 2017-05-01 ENCOUNTER — Ambulatory Visit: Payer: Medicare Other

## 2017-05-01 ENCOUNTER — Encounter (HOSPITAL_COMMUNITY): Payer: Self-pay | Admitting: Radiology

## 2017-05-01 DIAGNOSIS — G40901 Epilepsy, unspecified, not intractable, with status epilepticus: Secondary | ICD-10-CM | POA: Diagnosis not present

## 2017-05-01 DIAGNOSIS — N183 Chronic kidney disease, stage 3 (moderate): Secondary | ICD-10-CM | POA: Diagnosis not present

## 2017-05-01 DIAGNOSIS — R4182 Altered mental status, unspecified: Secondary | ICD-10-CM | POA: Diagnosis not present

## 2017-05-01 DIAGNOSIS — H538 Other visual disturbances: Secondary | ICD-10-CM | POA: Diagnosis not present

## 2017-05-01 DIAGNOSIS — G9340 Encephalopathy, unspecified: Secondary | ICD-10-CM | POA: Diagnosis not present

## 2017-05-01 DIAGNOSIS — H510 Palsy (spasm) of conjugate gaze: Secondary | ICD-10-CM | POA: Diagnosis not present

## 2017-05-01 LAB — GLUCOSE, CAPILLARY
GLUCOSE-CAPILLARY: 233 mg/dL — AB (ref 65–99)
GLUCOSE-CAPILLARY: 251 mg/dL — AB (ref 65–99)
Glucose-Capillary: 204 mg/dL — ABNORMAL HIGH (ref 65–99)

## 2017-05-01 MED ORDER — PHENYTOIN 50 MG PO CHEW: 100.0000 mg | CHEWABLE_TABLET | Freq: Three times a day (TID) | ORAL | Status: AC

## 2017-05-01 MED ORDER — LEVETIRACETAM 1000 MG PO TABS: 1000.0000 mg | ORAL_TABLET | Freq: Two times a day (BID) | ORAL | Status: AC

## 2017-05-01 MED ORDER — LACOSAMIDE 150 MG PO TABS: 150.0000 mg | ORAL_TABLET | Freq: Two times a day (BID) | ORAL | Status: AC

## 2017-05-01 MED ORDER — INSULIN ASPART 100 UNIT/ML ~~LOC~~ SOLN: 0.0000 [IU] | Freq: Three times a day (TID) | SUBCUTANEOUS | 11 refills | Status: AC

## 2017-05-01 MED ORDER — CLONAZEPAM 1 MG PO TABS: 1.0000 mg | ORAL_TABLET | Freq: Every day | ORAL | 0 refills | Status: AC

## 2017-05-01 MED ORDER — INSULIN DETEMIR 100 UNIT/ML ~~LOC~~ SOLN: 25.0000 [IU] | Freq: Every day | SUBCUTANEOUS | 11 refills | Status: AC

## 2017-05-01 NOTE — Care Management Important Message (Signed)
Important Message  Patient Details  Name: Caitlin Morris MRN: [id number] Date of Birth: 1950/10/12   Medicare Important Message Given:    Due to illness patient not able to sign/Unsigned copy left at patient bedside   Orbie Pyo 05/01/2017, 1:56 PM

## 2017-05-01 NOTE — Progress Notes (Signed)
Results for Caitlin Morris, Caitlin Morris (MRN [id number]) as of 05/01/2017 14:20  Ref. Range 04/30/2017 12:06 04/30/2017 16:49 04/30/2017 22:15 05/01/2017 07:59 05/01/2017 12:15  Glucose-Capillary Latest Ref Range: 65 - 99 mg/dL 284 (H) 250 (H) 288 (H) 233 (H) 251 (H)  Noted that blood sugars continue to be greatter than 200 mg/dl.  Recommend increasing Levemir to 30 units daily and continuing Novolog MODERATE correction scale TID & HS if blood sugars continue to be elevated. KM

## 2017-05-01 NOTE — Progress Notes (Signed)
CSW updated patient's daughter that patient only has Medicare Part B and Medicaid, so patient will have to be placed in SNF through Medicaid. Patient's daughter reports understanding that she will need to stay 30 days and may need to sign over SSi check. Only accepting facilities are [facility] and [facility]. Daughter chooses [facility].   Percell Locus Tomasz Steeves LCSW [phone]

## 2017-05-01 NOTE — Discharge Summary (Signed)
Physician Discharge Summary  Caitlin Morris UUE:[id number] DOB: [birth year] DOA: 04/24/2017  PCP: Benito Mccreedy, MD  Admit date: 04/24/2017 Discharge date: 05/01/2017  Admitted From: home Disposition:  SNF  Recommendations for Outpatient Follow-up:  1. Follow up with PCP in 1-2 weeks 2. Ambulatory referral to neurology, follow-up in 2 weeks  Home Health: none Equipment/Devices: none  Discharge Condition: Stable CODE STATUS: Full code Diet recommendation: Diabetic  HPI: Per Dr. Roel Cluck, Caitlin Morris is a 67 y.o. female with medical history significant of diabetes, hypertension, hyperlipidemia, sick sinus syndrome history of CVA with residual left-sided weakness, CKD Presented with nausea vomiting occasional headaches blurred vision for the past 6 days no associated diarrhea. Blurry vision on Thursday 6 days ago, trouble remembering words, family stated her speech seemed to be repetitive.  Confused about where she was located.  Saturday 2 days ago developed gaze to the right.  Note patient has had persistent nausea and vomiting on a daily basis family did not report any associated fevers chills neck stiffness no chest pain shortness of breath or patient has reported no diarrhea. NO fever.   Patient has not been recently compliant with her insulin. Prior hx of CVA with residual left-sided weakness apparently patient has had in April 2018 with blurred vision at that time and was thought to be secondary to uncontrolled diabetes Regarding pertinent Chronic problems: She has poorly controlled hypertension she is on  amlodipine. Did not tolerate beta-blockers in the past secondary to bradycardia Last echogram done in 2017 showed preserved EF grade 2 diastolic dysfunction History of diabetes on Lantus   Hospital Course: Acute metabolic encephalopathy in the setting of status epilepticus -EEG on 4/2 showed status epilepticus, patient was given Keppra, Dilantin, Vimpat, and was monitored with  continuous EEG.  It appears that this regimen adequately suppresses her epileptiform discharges, and her mental status is significantly improved and appears close to baseline.  She was transitioned to p.o. antiepileptics, has remained stable, and will be discharged to SNF in stable condition with outpatient neurology follow-up.  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 She underwent MRI with general anesthesia which did show abnormal signal in the right occipital lobe compatible with hyperglycemia hemianopia, in addition it was negative for acute CVA. Continue aspirin, 2D echo with normal LV size with mild LV hypertrophy, EF 55-60%. CBG control is of utmost importance Hypertension -Continue Norvasc Diabetes mellitus, poorly controlled, with polyneuropathy -Continue Levemir and sliding scale, she has remained stable, continue to adjust as clinically indicated. Most recent hemoglobin A1c a few days ago 11.3 CKD stage III -Creatinine overall stable Gout -quiescent, continueallopurinol Glaucoma -Continueeye drops Anxiety  Mood disorder -Keep on home regimen    Discharge Diagnoses:  Active Problems:   Type 2 diabetes mellitus, uncontrolled, with neuropathy (HCC)   Essential hypertension   CKD (chronic kidney disease) stage 3, GFR 30-59 ml/min (HCC)   Uncontrolled diabetes mellitus (HCC)   Hyperlipidemia   Acute encephalopathy   Abnormal lateral conjugate gaze   CVA (cerebral vascular accident) ([place])   Stage II pressure ulcer of buttock     Discharge Instructions  Discharge Instructions    Ambulatory referral to Neurology   Complete by:  As directed    An appointment is requested in approximately: 2 weeks     Allergies as of 05/01/2017      Reactions   Codeine Hives, Swelling      Medication List  STOP taking these medications   insulin aspart 100 UNIT/ML FlexPen Commonly known as:   NOVOLOG Replaced by:  insulin aspart 100 UNIT/ML injection   insulin glargine 100 unit/mL Sopn Commonly known as:  LANTUS   LEVEMIR FLEXTOUCH 100 UNIT/ML Pen Generic drug:  Insulin Detemir Replaced by:  insulin detemir 100 UNIT/ML injection     TAKE these medications   accu-chek soft touch lancets Use as instructed   allopurinol 100 MG tablet Commonly known as:  ZYLOPRIM Take 100 mg by mouth 2 (two) times daily.   amLODipine 10 MG tablet Commonly known as:  NORVASC Take 10 mg by mouth daily.   clonazePAM 1 MG tablet Commonly known as:  KLONOPIN Take 1 tablet (1 mg total) by mouth at bedtime.   COMBIGAN 0.2-0.5 % ophthalmic solution Generic drug:  brimonidine-timolol Place 1 drop into both eyes every 12 (twelve) hours.   gabapentin 600 MG tablet Commonly known as:  NEURONTIN Take 600 mg by mouth 2 (two) times daily.   glucose blood test strip Commonly known as:  ACCU-CHEK ACTIVE STRIPS Use as instructed   glucose monitoring kit monitoring kit 1 each by Does not apply route 4 (four) times daily - after meals and at bedtime. 1 month Diabetic Testing Supplies for QAC-QHS accuchecks.   insulin aspart 100 UNIT/ML injection Commonly known as:  novoLOG Inject 0-15 Units into the skin 3 (three) times daily with meals. Replaces:  insulin aspart 100 UNIT/ML FlexPen   insulin detemir 100 UNIT/ML injection Commonly known as:  LEVEMIR Inject 0.25 mLs (25 Units total) into the skin daily at 10 pm. Replaces:  LEVEMIR FLEXTOUCH 100 UNIT/ML Pen   Insulin Pen Needle 29G X 16XW Misc 1 application by Does not apply route at bedtime.   Lacosamide 150 MG Tabs Take 1 tablet (150 mg total) by mouth 2 (two) times daily.   levETIRAcetam 1000 MG tablet Commonly known as:  KEPPRA Take 1 tablet (1,000 mg total) by mouth 2 (two) times daily.   phenytoin 50 MG tablet Commonly known as:  DILANTIN Chew 2 tablets (100 mg total) by mouth every 8 (eight) hours.   pravastatin 80 MG  tablet Commonly known as:  PRAVACHOL Take 80 mg by mouth at bedtime.   sertraline 100 MG tablet Commonly known as:  ZOLOFT Take 100 mg by mouth at bedtime.   tetrahydrozoline-zinc 0.05-0.25 % ophthalmic solution Commonly known as:  VISINE-AC Place 2 drops into both eyes 3 (three) times daily as needed (DRY EYES).      Consultations:  Neurology  Procedures/Studies:  2D echo  Impressions: - Normal LV size with mild LV hypertrophy. EF 55-60%. Normal RV size and systolic function. No significant valvular abnormalities.  Dg Chest 2 View  Result Date: 04/24/2017 CLINICAL DATA:  Altered mental status EXAM: CHEST - 2 VIEW COMPARISON:  08/29/2015 FINDINGS: No acute pulmonary infiltrate or effusion. Mild cardiomegaly with aortic atherosclerosis. No pneumothorax. Degenerative changes of the spine. IMPRESSION: No active cardiopulmonary disease.  Mild cardiomegaly. Electronically Signed   By: Donavan Foil M.D.   On: 04/24/2017 20:34   Ct Head Wo Contrast  Addendum Date: 04/24/2017   ADDENDUM REPORT: 04/24/2017 21:12 ADDENDUM: Correction to impression Impression #2 should read: Small vessel ischemic changes of the white matter with old appearing bilateral lacunar infarcts. Electronically Signed   By: Donavan Foil M.D.   On: 04/24/2017 21:12   Result Date: 04/24/2017 CLINICAL DATA:  Nausea vomiting headache and altered LOC EXAM: CT HEAD WITHOUT CONTRAST TECHNIQUE: Contiguous  axial images were obtained from the base of the skull through the vertex without intravenous contrast. COMPARISON:  None. FINDINGS: Brain: No acute territorial infarction, hemorrhage or intracranial mass is visualized. Moderate small vessel ischemic changes of the white matter. Old appearing lacunar infarcts in the right basal ganglia and left caudate. Probable old left posterior white matter injury with mild asymmetric enlargement of the left atria. Vascular: No hyperdense vessels.  Carotid vascular calcification. Skull: No  fracture or suspicious lesion Sinuses/Orbits: No acute finding. Other: None IMPRESSION: 1. No definite CT evidence for acute intracranial abnormality. 2. Small vessel ischemic changes of the white matter and old appearing bilateral acute infarcts. Electronically Signed: By: Donavan Foil M.D. On: 04/24/2017 20:47   Mr Brain Wo Contrast  Addendum Date: 04/28/2017   ADDENDUM REPORT: 04/28/2017 11:50 ADDENDUM: Study discussed by telephone with Dr. Roland Rack on 04/28/2017 at 1130 hours. He advises that this patient has chronic right side vision loss, with recent loss of the left side vision and seizure activity. 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 There is abnormal subcortical and central white matter hypointensity throughout the right occipital lobe on axial FLAIR imaging (series 9, image 11). This abnormal decreased FLAIR signal is not found elsewhere throughout the brain. There is probably more subtle associated T2 hypointensity in the right occipital lobe. Axial diffusion imaging also demonstrates subtle increased right occipital cortical trace diffusion (series 3, image 24). CONCLUSION: Abnormal signal in the right occipital lobe, the constellation of which is most compatible with Hyperglycemic Hemianopia and Occipital Seizure. This was discussed with Dr. Roland Rack. Electronically Signed   By: Genevie Ann M.D.   On: 04/28/2017 11:50   Result Date: 04/28/2017 CLINICAL DATA:  67 year old female who presented with seizure, blurred vision and confusion. EXAM: MRI HEAD WITHOUT CONTRAST MRA HEAD WITHOUT CONTRAST TECHNIQUE: Multiplanar, multiecho pulse sequences of the brain and surrounding structures were obtained without intravenous contrast. Angiographic images of the head were obtained using MRA technique without contrast. COMPARISON:  Head CT without contrast 04/24/2017. FINDINGS: Study performed under general anesthesia. MRI HEAD FINDINGS Brain: No restricted diffusion or evidence of  acute infarction. Chronic encephalomalacia affecting the left temporal stem (series 13, image 10) with abundant hemosiderin (series 10, image 49), and involvement of the dorsal left thalamus. Associated gliosis. Numerous additional chronic micro hemorrhages scattered in the brain and concentrated in the deep gray matter nuclei and deep white matter capsules. Additional chronic lacunar infarcts in the bilateral corona radiata, bilateral basal ganglia, lateral right thalamus, and possibly also the pons. No definite cortical encephalomalacia. Normal cerebellum aside from a chronic microhemorrhage in the left deep cerebellar nuclei. The hippocampal formations appear symmetric and within normal limits. No midline shift, mass effect, evidence of mass lesion, ventriculomegaly, extra-axial collection or acute intracranial hemorrhage. Cervicomedullary junction and pituitary are within normal limits. Vascular: Major intracranial vascular flow voids are preserved, the distal right vertebral artery appears dominant. See MRA findings below. Skull and upper cervical spine: Negative visible cervical spine. Normal bone marrow signal. Sinuses/Orbits: Normal orbits soft tissues. Paranasal Visualized paranasal sinuses and mastoids are stable and well pneumatized. Other: Visible internal auditory structures appear normal. Scalp and face soft tissues appear negative. MRA HEAD FINDINGS Antegrade flow in the posterior circulation. Dominant distal right vertebral artery with duplicated right PICA or right PICA origin (normal variant). No distal right vertebral stenosis. The non dominant left vertebral is diminutive beyond the normal left PICA origin. Patent basilar artery with mild irregularity but no stenosis. Patent SCA  and PCA origins. Fetal type left PCA origin. Mild left PCA irregularity, but no PCA stenosis bilaterally. Antegrade flow in both ICA siphons. Mild siphon irregularity but no stenosis. The ophthalmic and posterior  communicating artery origins are normal. Patent carotid termini. Normal MCA and ACA origins. Diminutive or absent anterior communicating artery. Visible ACA branches are within normal limits. The left MCA M1 segment is mildly irregular without stenosis. The left MCA bifurcation and visible left MCA branches appear normal. The right MCA M1 segment is mildly irregular without stenosis. The right MCA bifurcation and visible right MCA branches appear normal. IMPRESSION: 1.  No acute intracranial abnormality. 2. Advanced chronic small vessel disease in the deep gray matter nuclei and deep white matter capsules. Prominent chronic infarct with hemosiderin and gliosis centered at the left temporal stem, with involvement of the dorsal left thalamus and likely involving the left optic radiations. 3. Intracranial atherosclerosis but no hemodynamically significant stenosis or circle of Willis branch occlusion on intracranial MRA. Electronically Signed: By: Genevie Ann M.D. On: 04/28/2017 09:59   Mr Jodene Nam Head Wo Contrast  Addendum Date: 04/28/2017   ADDENDUM REPORT: 04/28/2017 11:50 ADDENDUM: Study discussed by telephone with Dr. Roland Rack on 04/28/2017 at 1130 hours. He advises that this patient has chronic right side vision loss, with recent loss of the left side vision and seizure activity. 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 There is abnormal subcortical and central white matter hypointensity throughout the right occipital lobe on axial FLAIR imaging (series 9, image 11). This abnormal decreased FLAIR signal is not found elsewhere throughout the brain. There is probably more subtle associated T2 hypointensity in the right occipital lobe. Axial diffusion imaging also demonstrates subtle increased right occipital cortical trace diffusion (series 3, image 24). CONCLUSION: Abnormal signal in the right occipital lobe, the constellation of which is most compatible with Hyperglycemic Hemianopia and Occipital  Seizure. This was discussed with Dr. Roland Rack. Electronically Signed   By: Genevie Ann M.D.   On: 04/28/2017 11:50   Result Date: 04/28/2017 CLINICAL DATA:  67 year old female who presented with seizure, blurred vision and confusion. EXAM: MRI HEAD WITHOUT CONTRAST MRA HEAD WITHOUT CONTRAST TECHNIQUE: Multiplanar, multiecho pulse sequences of the brain and surrounding structures were obtained without intravenous contrast. Angiographic images of the head were obtained using MRA technique without contrast. COMPARISON:  Head CT without contrast 04/24/2017. FINDINGS: Study performed under general anesthesia. MRI HEAD FINDINGS Brain: No restricted diffusion or evidence of acute infarction. Chronic encephalomalacia affecting the left temporal stem (series 13, image 10) with abundant hemosiderin (series 10, image 49), and involvement of the dorsal left thalamus. Associated gliosis. Numerous additional chronic micro hemorrhages scattered in the brain and concentrated in the deep gray matter nuclei and deep white matter capsules. Additional chronic lacunar infarcts in the bilateral corona radiata, bilateral basal ganglia, lateral right thalamus, and possibly also the pons. No definite cortical encephalomalacia. Normal cerebellum aside from a chronic microhemorrhage in the left deep cerebellar nuclei. The hippocampal formations appear symmetric and within normal limits. No midline shift, mass effect, evidence of mass lesion, ventriculomegaly, extra-axial collection or acute intracranial hemorrhage. Cervicomedullary junction and pituitary are within normal limits. Vascular: Major intracranial vascular flow voids are preserved, the distal right vertebral artery appears dominant. See MRA findings below. Skull and upper cervical spine: Negative visible cervical spine. Normal bone marrow signal. Sinuses/Orbits: Normal orbits soft tissues. Paranasal Visualized paranasal sinuses and mastoids are stable and well pneumatized.  Other: Visible internal auditory structures appear normal. Scalp  and face soft tissues appear negative. MRA HEAD FINDINGS Antegrade flow in the posterior circulation. Dominant distal right vertebral artery with duplicated right PICA or right PICA origin (normal variant). No distal right vertebral stenosis. The non dominant left vertebral is diminutive beyond the normal left PICA origin. Patent basilar artery with mild irregularity but no stenosis. Patent SCA and PCA origins. Fetal type left PCA origin. Mild left PCA irregularity, but no PCA stenosis bilaterally. Antegrade flow in both ICA siphons. Mild siphon irregularity but no stenosis. The ophthalmic and posterior communicating artery origins are normal. Patent carotid termini. Normal MCA and ACA origins. Diminutive or absent anterior communicating artery. Visible ACA branches are within normal limits. The left MCA M1 segment is mildly irregular without stenosis. The left MCA bifurcation and visible left MCA branches appear normal. The right MCA M1 segment is mildly irregular without stenosis. The right MCA bifurcation and visible right MCA branches appear normal. IMPRESSION: 1.  No acute intracranial abnormality. 2. Advanced chronic small vessel disease in the deep gray matter nuclei and deep white matter capsules. Prominent chronic infarct with hemosiderin and gliosis centered at the left temporal stem, with involvement of the dorsal left thalamus and likely involving the left optic radiations. 3. Intracranial atherosclerosis but no hemodynamically significant stenosis or circle of Willis branch occlusion on intracranial MRA. Electronically Signed: By: Genevie Ann M.D. On: 04/28/2017 09:59     Subjective: - no chest pain, shortness of breath, no abdominal pain, nausea or vomiting.   Discharge Exam: Vitals:   05/01/17 0611 05/01/17 0904  BP: (!) 149/98 (!) 149/96  Pulse: 75   Resp: 18   Temp: 97.7 F (36.5 C)   SpO2: 98%     General: Pt is alert,  awake, not in acute distress Cardiovascular: RRR, S1/S2 +, no rubs, no gallops Respiratory: CTA bilaterally, no wheezing, no rhonchi Abdominal: Soft, NT, ND, bowel sounds + Extremities: no edema, no cyanosis    The results of significant diagnostics from this hospitalization (including imaging, microbiology, ancillary and laboratory) are listed below for reference.     Microbiology: Recent Results (from the past 240 hour(s))  Urine culture     Status: Abnormal   Collection Time: 04/24/17 11:14 PM  Result Value Ref Range Status   Specimen Description   Final    URINE, RANDOM Performed at [facility] [address], [place], [place] [address]    Special Requests   Final    NONE Performed at [facility], [place] [address] [place],  [address]    Culture MULTIPLE SPECIES PRESENT, SUGGEST RECOLLECTION (A)  Final   Report Status 04/26/2017 FINAL  Final     Labs: BNP (last 3 results) No results for input(s): BNP in the last 8760 hours. Basic Metabolic Panel: Recent Labs  Lab 04/26/17 1042 04/27/17 0410 04/27/17 2033 04/28/17 0634 04/29/17 0220 04/30/17 0603  NA 139 137  --  139 140 139  K 2.9* 2.9*  --  3.5 3.5 3.3*  CL 102 101  --  103 106 104  CO2 26 27  --  23 21* 22  GLUCOSE 201* 188*  --  167* 164* 167*  BUN 22* 17  --  _0 CREATININE 1.48* 1.36*  --  1.09* 1.31* 1.14*  CALCIUM 8.7* 8.7*  --  9.0 8.9 9.2  MG  --   --  2.0  --   --   --    Liver Function Tests: Recent  Labs  Lab 04/24/17 1730  AST 22  ALT 14  ALKPHOS 131*  BILITOT 1.6*  PROT 8.1  ALBUMIN 3.9   Recent Labs  Lab 04/24/17 1730  LIPASE 33   No results for input(s): AMMONIA in the last 168 hours. CBC: Recent Labs  Lab 04/26/17 1042 04/27/17 0410 04/28/17 0634 04/29/17 0220 04/30/17 0603  WBC 8.2 6.1 5.6 5.9 4.2  HGB 13.5 13.0 14.1 12.1 12.7  HCT 42.6 41.5 44.2 39.9 40.5  MCV 85.9 86.5 85.7 86.9 84.7  PLT 254 242 275 243 275    Cardiac Enzymes: Recent Labs  Lab 04/25/17 0000 04/25/17 1528  TROPONINI 0.05* 0.06*   BNP: Invalid input(s): POCBNP CBG: Recent Labs  Lab 04/30/17 0807 04/30/17 1206 04/30/17 1649 04/30/17 2215 05/01/17 0759  GLUCAP 134* 284* 250* 288* 233*   D-Dimer No results for input(s): DDIMER in the last 72 hours. Hgb A1c No results for input(s): HGBA1C in the last 72 hours. Lipid Profile No results for input(s): CHOL, HDL, LDLCALC, TRIG, CHOLHDL, LDLDIRECT in the last 72 hours. Thyroid function studies No results for input(s): TSH, T4TOTAL, T3FREE, THYROIDAB in the last 72 hours.  Invalid input(s): FREET3 Anemia work up No results for input(s): VITAMINB12, FOLATE, FERRITIN, TIBC, IRON, RETICCTPCT in the last 72 hours. Urinalysis    Component Value Date/Time   COLORURINE YELLOW 04/24/2017 1759   APPEARANCEUR CLEAR 04/24/2017 1759   LABSPEC 1.026 04/24/2017 1759   PHURINE 6.0 04/24/2017 1759   GLUCOSEU >=500 (A) 04/24/2017 1759   HGBUR SMALL (A) 04/24/2017 1759   HGBUR negative 07/07/2009 0957   BILIRUBINUR NEGATIVE 04/24/2017 1759   KETONESUR NEGATIVE 04/24/2017 1759   PROTEINUR >=300 (A) 04/24/2017 1759   UROBILINOGEN 1.0 10/01/2013 0915   NITRITE NEGATIVE 04/24/2017 1759   LEUKOCYTESUR SMALL (A) 04/24/2017 1759   Sepsis Labs Invalid input(s): PROCALCITONIN,  WBC,  LACTICIDVEN   Time coordinating discharge: 40 minutes  SIGNED:  Marzetta Board, MD  [facility] 05/01/2017, 11:58 AM Pager [phone]  If 7PM-7AM, please contact night-coverage [email or web address] Password [email or web address]

## 2017-05-01 NOTE — Clinical Social Work Placement (Signed)
   CLINICAL SOCIAL WORK PLACEMENT  NOTE  Date:  05/01/2017  Patient Details  Name: Caitlin Morris MRN: [id number] Date of Birth: September 11, 1950  Clinical Social Work is seeking post-discharge placement for this patient at the [place] level of care (*CSW will initial, date and re-position this form in  chart as items are completed):  Yes   Patient/family provided with [place] Work Department's list of facilities offering this level of care within the geographic area requested by the patient (or if unable, by the patient's family).  Yes   Patient/family informed of their freedom to choose among providers that offer the needed level of care, that participate in Medicare, Medicaid or managed care program needed by the patient, have an available bed and are willing to accept the patient.  Yes   Patient/family informed of [facility]'s ownership interest in [facility] and [facility], as well as of the fact that they are under no obligation to receive care at these facilities.  PASRR submitted to EDS on 04/27/17     PASRR number received on 04/27/17     Existing PASRR number confirmed on       FL2 transmitted to all facilities in geographic area requested by pt/family on 04/27/17     FL2 transmitted to all facilities within larger geographic area on       Patient informed that his/her managed care company has contracts with or will negotiate with certain facilities, including the following:        Yes   Patient/family informed of bed offers received.  Patient chooses bed at [facility]     Physician recommends and patient chooses bed at      Patient to be transferred to [facility] on 05/01/17.  Patient to be transferred to facility by [facility]     Patient family notified on 05/01/17 of transfer.  Name of family member notified:  Daughter, Hoyle Sauer     PHYSICIAN        Additional Comment:    _______________________________________________ Benard Halsted, [place] 05/01/2017, 12:13 PM

## 2017-05-01 NOTE — Progress Notes (Signed)
Physical Therapy Treatment Patient Details Name: Caitlin Morris MRN: [id number] DOB: 1950-10-10 Today's Date: 05/01/2017    History of Present Illness Pt. is a 67 y.o. F with significant PMH of diabetes, hypertension, hyperlipidemia, sick sinus syndrome, previous CVA with residual left sided weakness, and CKD. Presenting with 6 days of nausea/vomiting, occasional headaches, confusion and blurry vision.  CT negative for acute changes and ntoed small vessel ischemic changes of the white matter with old appearing bilateral lacunar infarcts.     PT Comments    Patient maintaining similar functional mobility status in comparison to yesterday's session. Has flat affect and is oriented to self and place but not time or situation. Has significantly decreased safety awareness and attention during mobility. Needs multiple standing rest breaks in order to stop and reposition due to poor proximity to RW and inability to control forward momentum. Patient with forward LOB today, requiring maxA by PT to steady and return to bed. Continue to recommend SNF based on patient presenting as significant fall risk.     Follow Up Recommendations  SNF     Equipment Recommendations  Other (comment)(Defer to SNF)    Recommendations for Other Services       Precautions / Restrictions Precautions Precautions: Fall Restrictions Weight Bearing Restrictions: No    Mobility  Bed Mobility Overal bed mobility: Needs Assistance Bed Mobility: Supine to Sit       Sit to supine: Min assist   General bed mobility comments: min assist to elevate trunk   Transfers Overall transfer level: Needs assistance Equipment used: Rolling walker (2 wheeled) Transfers: Sit to/from Stand Sit to Stand: Mod assist         General transfer comment: Moderate assist to power up to standing from bed to RW. Significantly decreased eccentric control from stand to sit due to fatigue.  Ambulation/Gait Ambulation/Gait assistance:  Min assist;Max assist Ambulation Distance (Feet): 80 Feet Assistive device: Rolling walker (2 wheeled) Gait Pattern/deviations: Step-to pattern;Decreased stride length;Shuffle;Wide base of support;Drifts right/left;Trunk flexed;Decreased weight shift to right Gait velocity: patient with poor coordination of RLE, multi modal cues for increased stride, cadence and posture. Manual assist to control RW and maintain stability. 3 standing rest breaks required with manual assist to reaffirm hand placement and positioning Gait velocity interpretation: Below normal speed for age/gender General Gait Details: Patient requiring mostly min assist throughout gait but one LOB due to increased forward momentum and poor proximity to RW, requiring maxA by PT to regain balance. Patient with poor coordination of LLE, with decreased foot clearance and hip external rotation. Manual assist to control RW and maintain stability. With fatigue, patient with significant trunk flexion and poor proximity to RW. Standing rest breaks requires to reaffirm proximity to RW.    Stairs            Wheelchair Mobility    Modified Rankin (Stroke Patients Only) Modified Rankin (Stroke Patients Only) Pre-Morbid Rankin Score: Slight disability Modified Rankin: Severe disability     Balance Overall balance assessment: Needs assistance Sitting-balance support: Bilateral upper extremity supported;Feet supported Sitting balance-Leahy Scale: Fair Sitting balance - Comments: able to sit EOB without physical assist   Standing balance support: Bilateral upper extremity supported Standing balance-Leahy Scale: Poor Standing balance comment: Bil UE support required                            Cognition Arousal/Alertness: Awake/alert Behavior During Therapy: WFL for tasks assessed/performed Overall Cognitive Status:  Impaired/Different from baseline Area of Impairment: Following commands;Problem solving;Attention                    Current Attention Level: Selective   Following Commands: Follows one step commands with increased time;Follows one step commands inconsistently     Problem Solving: Slow processing;Requires verbal cues;Requires tactile cues General Comments: Increased time and effort to perform activity. Does not follow simple commands consistently for safety and attention to task during ambulation      Exercises      General Comments        Pertinent Vitals/Pain Faces Pain Scale: No hurt    Home Living                      Prior Function            PT Goals (current goals can now be found in the care plan section) Acute Rehab PT Goals Patient Stated Goal: "to get herself in order" Progress towards PT goals: Progressing toward goals    Frequency    Min 3X/week      PT Plan Current plan remains appropriate    Co-evaluation              AM-PAC PT "6 Clicks" Daily Activity  Outcome Measure  Difficulty turning over in bed (including adjusting bedclothes, sheets and blankets)?: Unable Difficulty moving from lying on back to sitting on the side of the bed? : Unable Difficulty sitting down on and standing up from a chair with arms (e.g., wheelchair, bedside commode, etc,.)?: Unable Help needed moving to and from a bed to chair (including a wheelchair)?: A Little Help needed walking in hospital room?: A Lot Help needed climbing 3-5 steps with a railing? : Total 6 Click Score: 9    End of Session Equipment Utilized During Treatment: Gait belt Activity Tolerance: Patient tolerated treatment well;Patient limited by fatigue Patient left: in bed;with call bell/phone within reach;with family/visitor present;with bed alarm set Nurse Communication: Mobility status PT Visit Diagnosis: Hemiplegia and hemiparesis;Difficulty in walking, not elsewhere classified (R26.2);Other abnormalities of gait and mobility (R26.89) Hemiplegia - Right/Left: Left      Time: 0175-1025 PT Time Calculation (min) (ACUTE ONLY): 25 min  Charges:  $Gait Training: 23-37 mins                    G Codes:       Ellamae Sia, PT, DPT Acute Rehabilitation Services  Pager: [phone]    Willy Eddy 05/01/2017, 1:48 PM

## 2017-05-01 NOTE — Progress Notes (Addendum)
Patient will DC to: [facility]) Anticipated DC date: 05/01/17 Family notified: Daughter, English as a second language teacher by: Corey Harold   Per MD patient ready for DC to [facility]. RN, patient, patient's family, and facility notified of DC. Discharge Summary sent to facility. RN given number for report (([phone]) [phone] Room 158a). DC packet on chart. Ambulance transport requested for patient.   CSW signing off.  Cedric Fishman, LCSW Clinical Social Worker [phone]

## 2017-05-01 NOTE — Progress Notes (Signed)
Occupational Therapy Treatment Patient Details Name: Caitlin Morris MRN: [id number] DOB: 06-Jun-1950 Today's Date: 05/01/2017    History of present illness Pt. is a 67 y.o. F with significant PMH of diabetes, hypertension, hyperlipidemia, sick sinus syndrome, previous CVA with residual left sided weakness, and CKD. Presenting with 6 days of nausea/vomiting, occasional headaches, confusion and blurry vision.  CT negative for acute changes and ntoed small vessel ischemic changes of the white matter with old appearing bilateral lacunar infarcts. 4/5 MRI Abnormal signal in the right occipital lobe, the constellation of which is most compatible with Hyperglycemic Hemianopia and Occipital   OT comments  Recommendation for (A) for eating during and s/p acute stay due to visual deficits noted. Pt eating R side of tray and leaving the entire L portion when properly positioned EOB in neutral position. Pt motivated to go out to eat for birthday in May at [facility].   Follow Up Recommendations  SNF;Supervision/Assistance - 24 hour    Equipment Recommendations  Other (comment)    Recommendations for Other Services      Precautions / Restrictions Precautions Precautions: Fall Restrictions Weight Bearing Restrictions: No       Mobility Bed Mobility Overal bed mobility: Needs Assistance Bed Mobility: Sit to Supine       Sit to supine: Min assist   General bed mobility comments: (A) to elevate bil LE into bed and bed tilted to allow patient to use BIL UE to pull on HOB to position higher in bed  Transfers Overall transfer level: Needs assistance Equipment used: Rolling walker (2 wheeled) Transfers: Sit to/from Stand Sit to Stand: Mod assist         General transfer comment: cues for hand placement     Balance Overall balance assessment: Needs assistance Sitting-balance support: No upper extremity supported;Feet supported Sitting balance-Leahy Scale: Fair Sitting balance -  Comments: pt initially with L lean. pt with cues and tactile input of bil LE on the floor with incr posture   Standing balance support: Bilateral upper extremity supported;During functional activity Standing balance-Leahy Scale: Poor Standing balance comment: pt initially with posterior lean                           ADL either performed or assessed with clinical judgement   ADL Overall ADL's : Needs assistance/impaired Eating/Feeding: Sitting;Minimal assistance Eating/Feeding Details (indicate cue type and reason): requires (A) to reposition to EOB and locate utensils Grooming: Wash/dry face;Minimal assistance;Sitting Grooming Details (indicate cue type and reason): pt undershooting to locate a napkin                 Toilet Transfer: Moderate assistance;Ambulation;BSC;RW;Grab bars Toilet Transfer Details (indicate cue type and reason): pt requires for safety and positioning Toileting- Clothing Manipulation and Hygiene: Minimal assistance;Sitting/lateral lean       Functional mobility during ADLs: Moderate assistance;Rolling walker General ADL Comments: pt requires cues to remain in the RW and (A) to direct RW due to visual deficits Pt noted to eat foods on the right side of the plate. Pt leaving the entire L hand of the plate not touched during eating     Vision       Perception     Praxis      Cognition Arousal/Alertness: Awake/alert Behavior During Therapy: WFL for tasks assessed/performed Overall Cognitive Status: Impaired/Different from baseline Area of Impairment: Following commands;Problem solving;Attention;Safety/judgement;Awareness  Current Attention Level: Selective   Following Commands: Follows one step commands with increased time Safety/Judgement: Decreased awareness of safety;Decreased awareness of deficits Awareness: Emergent Problem Solving: Slow processing;Requires verbal cues;Requires tactile cues General  Comments: pt needs increased time, pt laying flat on her back in the bed attempting to eat lunch and lacked awareness for the need to have position change. Pt needed (A) to locate items on the tray due to visual changes        Exercises     Shoulder Instructions       General Comments      Pertinent Vitals/ Pain       Faces Pain Scale: No hurt  Home Living                                          Prior Functioning/Environment              Frequency  Min 2X/week        Progress Toward Goals  OT Goals(current goals can now be found in the care plan section)  Progress towards OT goals: Progressing toward goals  Acute Rehab OT Goals Patient Stated Goal: to go [facility] ADL Goals Pt Will Perform Grooming: with mod assist Pt Will Perform Upper Body Bathing: with mod assist Pt Will Perform Lower Body Bathing: with mod assist Pt Will Perform Upper Body Dressing: with mod assist Pt Will Perform Lower Body Dressing: with mod assist Pt Will Transfer to Toilet: with mod assist Pt Will Perform Toileting - Clothing Manipulation and hygiene: with mod assist  Plan Discharge plan remains appropriate    Co-evaluation                 AM-PAC PT "6 Clicks" Daily Activity     Outcome Measure   Help from another person eating meals?: A Lot Help from another person taking care of personal grooming?: Total Help from another person toileting, which includes using toliet, bedpan, or urinal?: Total Help from another person bathing (including washing, rinsing, drying)?: Total Help from another person to put on and taking off regular upper body clothing?: Total Help from another person to put on and taking off regular lower body clothing?: Total 6 Click Score: 7    End of Session Equipment Utilized During Treatment: Rolling walker;Gait belt  OT Visit Diagnosis: Unsteadiness on feet (R26.81);Repeated falls (R29.6);Muscle weakness (generalized) (M62.81)    Activity Tolerance Patient tolerated treatment well   Patient Left in bed;with call bell/phone within reach;with bed alarm set;with family/visitor present   Nurse Communication Mobility status;Precautions        Time: 2595-6387 OT Time Calculation (min): 39 min  Charges: OT General Charges $OT Visit: 1 Visit OT Treatments $Self Care/Home Management : 8-22 mins $Therapeutic Activity: 8-22 mins   Jeri Modena   OTR/L Pager: [phone] Office: [phone] .    Parke Poisson B 05/01/2017, 2:04 PM

## 2017-05-26 ENCOUNTER — Ambulatory Visit: Payer: Medicare Other | Admitting: Diagnostic Neuroimaging

## 2017-06-11 ENCOUNTER — Emergency Department (HOSPITAL_COMMUNITY): Payer: Medicare Other

## 2017-06-11 ENCOUNTER — Encounter (HOSPITAL_COMMUNITY): Payer: Self-pay | Admitting: Emergency Medicine

## 2017-06-11 ENCOUNTER — Emergency Department (HOSPITAL_COMMUNITY)
Admission: EM | Admit: 2017-06-11 | Discharge: 2017-06-24 | Disposition: E | Payer: Medicare Other | Attending: Emergency Medicine | Admitting: Emergency Medicine

## 2017-06-11 DIAGNOSIS — E1140 Type 2 diabetes mellitus with diabetic neuropathy, unspecified: Secondary | ICD-10-CM | POA: Diagnosis not present

## 2017-06-11 DIAGNOSIS — Z87891 Personal history of nicotine dependence: Secondary | ICD-10-CM | POA: Insufficient documentation

## 2017-06-11 DIAGNOSIS — E1122 Type 2 diabetes mellitus with diabetic chronic kidney disease: Secondary | ICD-10-CM | POA: Insufficient documentation

## 2017-06-11 DIAGNOSIS — I129 Hypertensive chronic kidney disease with stage 1 through stage 4 chronic kidney disease, or unspecified chronic kidney disease: Secondary | ICD-10-CM | POA: Diagnosis not present

## 2017-06-11 DIAGNOSIS — Z79899 Other long term (current) drug therapy: Secondary | ICD-10-CM | POA: Diagnosis not present

## 2017-06-11 DIAGNOSIS — I469 Cardiac arrest, cause unspecified: Secondary | ICD-10-CM | POA: Insufficient documentation

## 2017-06-11 DIAGNOSIS — N183 Chronic kidney disease, stage 3 (moderate): Secondary | ICD-10-CM | POA: Insufficient documentation

## 2017-06-11 DIAGNOSIS — R0602 Shortness of breath: Secondary | ICD-10-CM | POA: Diagnosis present

## 2017-06-11 DIAGNOSIS — Z794 Long term (current) use of insulin: Secondary | ICD-10-CM | POA: Diagnosis not present

## 2017-06-11 DIAGNOSIS — R40243 Glasgow coma scale score 3-8, unspecified time: Secondary | ICD-10-CM | POA: Insufficient documentation

## 2017-06-11 DIAGNOSIS — R402431 Glasgow coma scale score 3-8, in the field [EMT or ambulance]: Secondary | ICD-10-CM

## 2017-06-11 DIAGNOSIS — Z96652 Presence of left artificial knee joint: Secondary | ICD-10-CM | POA: Insufficient documentation

## 2017-06-11 LAB — CBG MONITORING, ED: GLUCOSE-CAPILLARY: 194 mg/dL — AB (ref 65–99)

## 2017-06-11 MED ORDER — SODIUM CHLORIDE 0.9 % IV SOLN
INTRAVENOUS | Status: AC | PRN
  Administered 2017-06-11: 1000 mL via INTRAVENOUS

## 2017-06-11 MED ORDER — EPINEPHRINE PF 1 MG/ML IJ SOLN
0.5000 ug/min | INTRAMUSCULAR | Status: DC
  Administered 2017-06-11: 1 ug/min via INTRAVENOUS
  Filled 2017-06-11: qty 4

## 2017-06-11 MED ORDER — SUCCINYLCHOLINE CHLORIDE 20 MG/ML IJ SOLN
INTRAMUSCULAR | Status: AC | PRN
  Administered 2017-06-11: 100 mg via INTRAVENOUS

## 2017-06-11 MED ORDER — SODIUM BICARBONATE 8.4 % IV SOLN
INTRAVENOUS | Status: AC | PRN
  Administered 2017-06-11: 50 meq via INTRAVENOUS

## 2017-06-11 MED ORDER — EPINEPHRINE PF 1 MG/10ML IJ SOSY
PREFILLED_SYRINGE | INTRAMUSCULAR | Status: AC | PRN
  Administered 2017-06-11: 1 mg via INTRAVENOUS

## 2017-06-11 MED ORDER — ETOMIDATE 2 MG/ML IV SOLN
INTRAVENOUS | Status: AC | PRN
  Administered 2017-06-11: 20 mg via INTRAVENOUS

## 2017-06-11 MED ORDER — IOPAMIDOL (ISOVUE-370) INJECTION 76%
INTRAVENOUS | Status: AC
  Filled 2017-06-11: qty 100

## 2017-06-11 MED ORDER — EPINEPHRINE PF 1 MG/10ML IJ SOSY
PREFILLED_SYRINGE | INTRAMUSCULAR | Status: AC | PRN
Start: 2017-06-11 — End: 2017-06-11
  Administered 2017-06-11 (×4): 1 mg via INTRAVENOUS

## 2017-06-11 MED ORDER — EPINEPHRINE PF 1 MG/10ML IJ SOSY
PREFILLED_SYRINGE | INTRAMUSCULAR | Status: AC | PRN
  Administered 2017-06-11 (×5): 1 mg via INTRAVENOUS

## 2017-06-11 MED ORDER — EPINEPHRINE PF 1 MG/10ML IJ SOSY
PREFILLED_SYRINGE | INTRAMUSCULAR | Status: AC
  Filled 2017-06-11: qty 40

## 2017-06-11 MED ORDER — EPINEPHRINE PF 1 MG/10ML IJ SOSY
PREFILLED_SYRINGE | INTRAMUSCULAR | Status: AC | PRN
  Administered 2017-06-11: 1 via INTRAVENOUS

## 2017-06-14 MED FILL — Medication: Qty: 1 | Status: AC

## 2017-06-24 NOTE — Code Documentation (Signed)
Lost capture on pacer; brady then asysole, CPR started

## 2017-06-24 NOTE — Progress Notes (Signed)
   20-Jun-2017 0300  Clinical Encounter Type  Visited With Family;Health care provider  Visit Type Follow-up;Death  Spiritual Encounters  Spiritual Needs Grief support   Accompanied Physician as he told the family of the patient's death.  Family was very upset.  Arranged for the family to see the patient and other family arrived.  Stayed and provided grief support and empathetic listening.  Obtained next of kin (daughter) contact information and provided the nurse with that information.  No funeral home selected yet.  Will follow and support as needed. Chaplain Katherene Ponto

## 2017-06-24 NOTE — ED Notes (Signed)
Pollina MD assessing ET tube placement as O2 sats decline, tube removed and reintubated with glidescope 7.5 ETT

## 2017-06-24 NOTE — Consult Note (Signed)
Neurology Consultation Reason for Consult: Altered Mental Status Referring Physician: Pollina, C  CC: Altered mental status  History is obtained from: Family  HPI: Caitlin Morris is a 67 y.o. 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  She apparently has been complaining of shortness of breath all day.  They noticed that she was slurring her speech.  She was confused all day, stated that they could not really understand what she was saying.  Then around 10 PM, it was noticed that she was not responding and therefore EMS was called.  EMS began bagging her in route because of respiratory distress and on arrival she was intubated.  Unfortunately she developed asystole shortly after intubation with return of spontaneous circulation.  She subsequently again became bradycardic requiring CPR.  Again she had return of spontaneous circulation and subsequently again became pulseless.   LKW: 10 AM tpa given?: no, out of window.   ROS:  Unable to obtain due to altered mental status.   Past Medical History:  Diagnosis Date  . Anxiety   . Arthritis    arthritis left foot and gout  . Cataract    not surgical yet  . Depression   . Diabetes mellitus without complication ([place])   . Glaucoma   . Hyperlipidemia   . Hypertension   . Obesity   . Stroke [facility])    left sided weakness remains-ambulates with cane.     Family History  Problem Relation Age of Onset  . Diabetes Sister   . Diabetes Son   . Breast cancer Cousin      Social History:  reports that she quit smoking about 12 years ago. Her smoking use included cigarettes. She has a 30.00 pack-year smoking history. She has never used smokeless tobacco. She reports that she does not drink alcohol or use drugs.   Exam: Current vital signs: BP (!) 172/75 (BP Location: Right Arm)    Pulse 75   Resp 20   LMP  (LMP Unknown)   SpO2 100%  Vital signs in last 24 hours: Pulse Rate:  [75] 75 (05/19 0024) Resp:  [20] 20 (05/19 0024) BP: (172)/(75) 172/75 (05/19 0024) SpO2:  [100 %] 100 % (05/19 0024)   Physical Exam  Constitutional: Appears elderly Psych: unresponsive Eyes: No scleral injection HENT: nasal trumpets in place Head: Normocephalic.  Cardiovascular: Normal rate and regular rhythm.  Respiratory: Effort normal, non-labored breathing GI: Soft.  No distension. There is no tenderness.  Skin: WDI  Neuro: Mental Status: Comatose, does not open eyes or follow commands Cranial Nerves: II: Does not blink to threat. Pupils are equal, round, and reactive to light.   III,IV, VI: EOMI without ptosis or diploplia.  V: Facial sensation is symmetric to temperature VII: Facial movement is symmetric.  VIII: hearing is intact to voice X: Uvula elevates symmetrically XI: Shoulder shrug is symmetric. XII: tongue is midline without atrophy or fasciculations.  Motor: No response to noxious stimuli Sensory: As above Cerebellar: Does not respond  I have reviewed labs in epic and the results pertinent to this consultation are: BMP - creatinine 1.14  Impression: 67 yo F with AMS in the setting of cardiac abnormailites. On arrival she was comatose with no clear cause. I suspect that she may have been having arrhythmia/anoxia earlier, though basilar occlusion could certainly explain her symptoms as well. I think that status epilepticus is  less likely, though possible as well.   Unfortunately, despite aggressive attempts to resuscitate her, she did not survive.    This patient is critically ill and at significant risk of neurological worsening, death and care requires constant monitoring of vital signs, hemodynamics,respiratory and cardiac monitoring, neurological assessment, discussion with family, other specialists and medical decision making of high complexity. I spent  50 minutes of neurocritical care time  in the care of  this patient.  Roland Rack, MD [facility] [phone]  If 7pm- 7am, please page neurology on call as listed in [place]. 2017/06/30  1:48 AM   Roland Rack, MD [facility] [phone]  If 7pm- 7am, please page neurology on call as listed in [place].

## 2017-06-24 NOTE — ED Provider Notes (Signed)
[facility] EMERGENCY DEPARTMENT Provider Note   CSN: [id number] Arrival date & time: Jul 08, 2017  0019     History   Chief Complaint Chief Complaint  Patient presents with  . Code Stroke    HPI Caitlin Morris is a 67 y.o. female.  Patient brought to emergency department from home.  Patient reportedly had been feeling short of breath through the course of the day.  She was checked on by family and was found to be nonresponsive.  EMS reports that the patient has had sluggish pupils with a fixed gaze, no response to any stimuli during transport. Level V Caveat due to acuity.     Past Medical History:  Diagnosis Date  . Anxiety   . Arthritis    arthritis left foot and gout  . Cataract    not surgical yet  . Depression   . Diabetes mellitus without complication ([place])   . Glaucoma   . Hyperlipidemia   . Hypertension   . Obesity   . Stroke [facility])    left sided weakness remains-ambulates with cane.    Patient Active Problem List   Diagnosis Date Noted  . Glasgow coma scale total score 3-8 ([place])   . Stage II pressure ulcer of buttock 04/25/2017  . Acute encephalopathy 04/24/2017  . Abnormal lateral conjugate gaze 04/24/2017  . CVA (cerebral vascular accident) ([place]) 04/24/2017  . Hyperosmolar non-ketotic state in patient with type 2 diabetes mellitus ([place]) 05/20/2016  . Hyperlipidemia 05/20/2016  . Stroke ([place]) 05/20/2016  . Arthritis 05/20/2016  . AKI (acute kidney injury) ([place]) 05/20/2016  . Glaucoma 05/20/2016  . Type 2 diabetes mellitus, uncontrolled, with neuropathy ([place]) 11/10/2015  . Polyneuropathy due to type 2 diabetes mellitus ([place]) 11/10/2015  . Essential hypertension 11/10/2015  . Bradycardia 11/10/2015  . Depression with anxiety 11/10/2015  . CKD (chronic kidney disease) stage 3, GFR 30-59 ml/min ([facility]) 11/10/2015  . Diabetic nephropathy ([place]) 11/10/2015  . Uncontrolled diabetes mellitus ([place]) 11/10/2015  . Arrhythmia 11/09/2015  .  Cardiac arrhythmia 11/09/2015  . Hyperparathyroidism, primary ([place]) 11/08/2015  . Avulsion fracture of ankle 10/15/2015  . Osteoarthritis of left knee 10/01/2013  . DENTAL PAIN 10/16/2009  . VAGINAL DISCHARGE 07/07/2009  . ARTHRALGIA 07/07/2009  . RENAL INSUFFICIENCY 06/21/2009  . DEGENERATIVE JOINT DISEASE, LEFT KNEE 05/21/2009  . KNEE PAIN, LEFT 05/05/2009  . WEIGHT GAIN 05/05/2009  . PROTEINURIA 05/05/2009  . BIPOLAR DISORDER UNSPECIFIED 04/04/2008  . HYPERTENSION 10/31/2007  . OSTEOPENIA 10/24/2005  . Hypercalcemia 01/24/2005    Past Surgical History:  Procedure Laterality Date  . JOINT REPLACEMENT     Left TKA  . PARATHYROIDECTOMY Left 12/31/2015   Procedure: LEFT MINIMALLY INVASIVE PARATHYROIDECTOMY;  Surgeon: Armandina Gemma, MD;  Location: [facility];  Service: General;  Laterality: Left;  . RADIOLOGY WITH ANESTHESIA N/A 04/28/2017   Procedure: BRAIN WITHOUT CONTRAST;  Surgeon: Radiologist, Medication, MD;  Location: [place];  Service: Radiology;  Laterality: N/A;  . TOTAL KNEE ARTHROPLASTY Left 10/01/2013   Procedure: TOTAL KNEE ARTHROPLASTY;  Surgeon: Nita Sells, MD;  Location: [facility];  Service: Orthopedics;  Laterality: Left;  Left total knee arthroplasty  . TUBAL LIGATION       OB History   None      Home Medications    Prior to Admission medications   Medication Sig Start Date End Date Taking? Authorizing Provider  allopurinol (ZYLOPRIM) 100 MG tablet Take 100 mg by mouth 2 (two) times daily.  10/22/15   [provider]  amLODipine (NORVASC) 10 MG tablet Take 10 mg by mouth daily.    [provider]  clonazePAM (KLONOPIN) 1 MG tablet Take 1 tablet (1 mg total) by mouth at bedtime. 05/01/17   Gherghe, Vella Redhead, MD  COMBIGAN 0.2-0.5 % ophthalmic solution Place 1 drop into both eyes every 12 (twelve) hours.  04/18/16   [provider]  gabapentin (NEURONTIN) 600 MG tablet Take 600 mg by mouth 2 (two) times daily.    [provider]    glucose blood (ACCU-CHEK ACTIVE STRIPS) test strip Use as instructed 11/11/15   Dhungel, Nishant, MD  glucose monitoring kit (FREESTYLE) monitoring kit 1 each by Does not apply route 4 (four) times daily - after meals and at bedtime. 1 month Diabetic Testing Supplies for QAC-QHS accuchecks. 11/11/15   Dhungel, Nishant, MD  insulin aspart (NOVOLOG) 100 UNIT/ML injection Inject 0-15 Units into the skin 3 (three) times daily with meals. 05/01/17   Caren Griffins, MD  insulin detemir (LEVEMIR) 100 UNIT/ML injection Inject 0.25 mLs (25 Units total) into the skin daily at 10 pm. 05/01/17   Caren Griffins, MD  Insulin Pen Needle 29G X 95MW MISC 1 application by Does not apply route at bedtime. 05/23/16   Donne Hazel, MD  lacosamide 150 MG TABS Take 1 tablet (150 mg total) by mouth 2 (two) times daily. 05/01/17   Caren Griffins, MD  Lancets (ACCU-CHEK SOFT TOUCH) lancets Use as instructed 11/11/15   Dhungel, Nishant, MD  levETIRAcetam (KEPPRA) 1000 MG tablet Take 1 tablet (1,000 mg total) by mouth 2 (two) times daily. 05/01/17   Caren Griffins, MD  phenytoin (DILANTIN) 50 MG tablet Chew 2 tablets (100 mg total) by mouth every 8 (eight) hours. 05/01/17   Caren Griffins, MD  pravastatin (PRAVACHOL) 80 MG tablet Take 80 mg by mouth at bedtime. 07/10/15   [provider]  sertraline (ZOLOFT) 100 MG tablet Take 100 mg by mouth at bedtime.     [provider]  tetrahydrozoline-zinc (VISINE-AC) 0.05-0.25 % ophthalmic solution Place 2 drops into both eyes 3 (three) times daily as needed (DRY EYES).    [provider]    Family History Family History  Problem Relation Age of Onset  . Diabetes Sister   . Diabetes Son   . Breast cancer Cousin     Social History Social History   Tobacco Use  . Smoking status: Former Smoker    Packs/day: 1.00    Years: 30.00    Pack years: 30.00    Types: Cigarettes    Last attempt to quit: 01/24/2005    Years since quitting: 12.3  .  Smokeless tobacco: Never Used  Substance Use Topics  . Alcohol use: No  . Drug use: No     Allergies   Codeine   Review of Systems Review of Systems  Unable to perform ROS: Acuity of condition     Physical Exam Updated Vital Signs BP 101/85   Pulse 99   Resp 18   Wt 83.9 kg (185 lb)   LMP  (LMP Unknown)   SpO2 (!) 44%   BMI 30.79 kg/m   Physical Exam  Constitutional: She appears well-developed and well-nourished.  HENT:  Head: Atraumatic.  Eyes:  Pupils 3 to 4 mm equal, sluggishly reactive  Disconjugate gaze  Neck: Neck supple.  Cardiovascular: Normal rate.  Pulmonary/Chest:  Breath sounds are shallow, increased work of breathing, being assisted by bag valve mask  Abdominal: Soft.  Musculoskeletal: She exhibits no deformity.  Neurological: She is unresponsive.  No response to painful stimuli, no spontaneous movement, no gag reflex she does have corneal reflex  Skin: Skin is intact.     ED Treatments / Results  Labs (all labs ordered are listed, but only abnormal results are displayed) Labs Reviewed  CBG MONITORING, ED - Abnormal; Notable for the following components:      Result Value   Glucose-Capillary 194 (*)    All other components within normal limits    EKG None  Radiology Dg Chest Portable 1 View  Result Date: 07-10-17 CLINICAL DATA:  Endotracheal tube placement. EXAM: PORTABLE CHEST 1 VIEW COMPARISON:  04/24/2017 FINDINGS: Tip of an endotracheal tube is seen in the proximal right mainstem bronchus. Pullback approximately 5 cm. Heart is top normal. Central vascular congestion is seen compatible with mild to moderate CHF. Gastric tube with side port extends in to the left hemiabdomen. The tip is excluded on current exam. External defibrillator and cardiac monitoring leads are in place. IMPRESSION: 1. Right mainstem intubation. Withdrawal of the endotracheal tube approximately 5 cm suggested. These results were called by telephone at the time of  interpretation on Jul 10, 2017 at 1:07 am to Dr. Joseph Berkshire , who verbally acknowledged these results. 2. Borderline cardiomegaly with central vascular congestion consistent with mild to moderate CHF. 3. Gastric tube with side port extends below the left hemidiaphragm into the expected location of the stomach. Electronically Signed   By: Ashley Royalty M.D.   On: Jul 10, 2017 01:07    Procedures Procedure Name: Intubation Date/Time: 07/10/17 12:27 AM Performed by: Orpah Greek, MD Pre-anesthesia Checklist: Patient identified, Patient being monitored, Emergency Drugs available, Timeout performed and Suction available Oxygen Delivery Method: Non-rebreather mask Preoxygenation: Pre-oxygenation with 100% oxygen Induction Type: Rapid sequence Ventilation: Mask ventilation without difficulty Laryngoscope Size: Glidescope and 3 Grade View: Grade I Tube size: 7.5 mm Number of attempts: 1 Placement Confirmation: ETT inserted through vocal cords under direct vision,  CO2 detector and Breath sounds checked- equal and bilateral Secured at: 24 cm Tube secured with: ETT holder Comments: Initially breath sounds only heard on the right, tube pulled back for presumed right mainstem intubation      (including critical care time)  Medications Ordered in ED Medications  iopamidol (ISOVUE-370) 76 % injection (has no administration in time range)  EPINEPHrine (ADRENALIN) 4 mg in dextrose 5 % 250 mL (0.016 mg/mL) infusion (1 mcg/min Intravenous New Bag/Given 07-10-17 0123)  EPINEPHrine (ADRENALIN) 1 MG/10ML injection (has no administration in time range)  EPINEPHrine (ADRENALIN) 1 MG/10ML injection (1 Syringe Intravenous Given 2017-07-10 0132)  etomidate (AMIDATE) injection (20 mg Intravenous Given 07/10/2017 0026)  0.9 %  sodium chloride infusion (1,000 mLs Intravenous New Bag/Given 10-Jul-2017 0026)  succinylcholine (ANECTINE) injection (100 mg Intravenous Given July 10, 2017 0027)  EPINEPHrine (ADRENALIN)  1 MG/10ML injection (1 mg Intravenous Given 07-10-2017 0046)  0.9 %  sodium chloride infusion (1,000 mLs Intravenous New Bag/Given 07-10-2017 0032)  sodium bicarbonate injection (50 mEq Intravenous Given 10-Jul-2017 0042)  EPINEPHrine (ADRENALIN) 1 MG/10ML injection (1 mg Intravenous Given 07/10/2017 0108)  sodium bicarbonate injection (50 mEq Intravenous Given 10-Jul-2017 0110)  EPINEPHrine (ADRENALIN) 1 MG/10ML injection (1 mg Intravenous Given Jul 10, 2017 0112)     Initial Impression / Assessment and Plan / ED Course  I have reviewed the triage vital signs and the nursing notes.  Pertinent labs & imaging results that were available during my care of the patient were reviewed by  me and considered in my medical decision making (see chart for details).     Patient brought to the emergency department from home.  Family reports that she had been complaining of shortness of breath through the course of the day and then when they eventually check on her after an unknown period of time, she was unresponsive.  EMS was assisting her breathing by bag-valve-mask upon arrival to the ER.  Neurologic exam was nonfocal, however, she had no response to any stimuli other than corneal reflexes.  Decision was made to intubate.  Patient had what appeared to be a nonspecific interventricular rhythm at arrival to the ER.  She was noted to become bradycardic and then pulses were lost.  Oxygen sats dropped at this time as well.  Although ET tube was visualized going through the cords initially, she was reintubated to ensure that tube was not dislodged when repositioning for presumed right mainstem intubation.  No improvement with condition, reintubated again and then visually inspected with a glide scope to ensure that ET tube was passing through the vocal cords.  CPR initiated when pulses were lost.  Patient administered epinephrine and then bicarb for prolonged CPR.  Patient noted to have return of spontaneous circulation.  Patient  temporarily stabilized and then became bradycardic again and ultimately into PEA and then asystole.  CPR reinitiated, patient administered epi boluses.  She was initiated on an epi drip.  Patient initiated on transcutaneous pacing with capture and return of peripheral pulses.  Patient maintained peripheral circulation on epi drip for period of time and then once again noted to have bradycardia, PEA followed by asystole.  CPR reinitiated again and patient given bolus of epi.  Pulse check at this time showed persistent asystole.  At this time, intermittent episodes of coding the patient were ongoing for 1 hour. It was felt that the patient was not recoverable, especially since her initial presentation appeared to have been consistent with devastating neurologic condition that was not survivable, patient was declared dead and CPR was terminated.  CRITICAL CARE Performed by: Orpah Greek   Total critical care time: 50 minutes  Critical care time was exclusive of separately billable procedures and treating other patients.  Critical care was necessary to treat or prevent imminent or life-threatening deterioration.  Critical care was time spent personally by me on the following activities: development of treatment plan with patient and/or surrogate as well as nursing, discussions with consultants, evaluation of patient's response to treatment, examination of patient, obtaining history from patient or surrogate, ordering and performing treatments and interventions, ordering and review of laboratory studies, ordering and review of radiographic studies, pulse oximetry and re-evaluation of patient's condition.   Final Clinical Impressions(s) / ED Diagnoses   Final diagnoses:  Cardiopulmonary arrest [facility])    ED Discharge Orders    None       Orpah Greek, MD 2017-07-11 [phone]

## 2017-06-24 NOTE — Code Documentation (Signed)
Patient time of death occurred at 59 by Betsey Holiday MD

## 2017-06-24 NOTE — ED Notes (Signed)
Pollina MD assessing ET tube placement as O2 sats decline, tube removed and reintubated with glidescope

## 2017-06-24 NOTE — ED Triage Notes (Signed)
Pt BIB [facility] from home, LSN 1000 this am. Family found pt unresponsive, GCS 3, family reports pt may have had slurred speech "earlier". Hx CVA, presents with bilateral NPAs.

## 2017-06-24 NOTE — Progress Notes (Signed)
   Jun 12, 2017 0135  Clinical Encounter Type  Visited With Family;Health care provider  Visit Type Critical Care;ED  Referral From Nurse  Consult/Referral To Chaplain  Spiritual Encounters  Spiritual Needs Emotional  Stress Factors  Family Stress Factors Health changes

## 2017-06-24 DEATH — deceased

## 2018-05-02 IMAGING — CT CT HEAD W/O CM
3 series · 15 of 46 positions shown, 18 images · non-contrast
Comparison: None.

ADDENDUM:
Correction to impression

Impression #2 should read: Small vessel ischemic changes of the
white matter with old appearing bilateral lacunar infarcts.
CLINICAL DATA: Nausea vomiting headache and altered LOC
EXAM:
CT HEAD WITHOUT CONTRAST
TECHNIQUE: Contiguous axial images were obtained from the base of the skull
through the vertex without intravenous contrast.

[Series 2: head wo · axial · 0.47mm/px · z∈[+1474,+1594]mm · 9 of 29 slices shown, 12 images]
[im 3/29  brain]
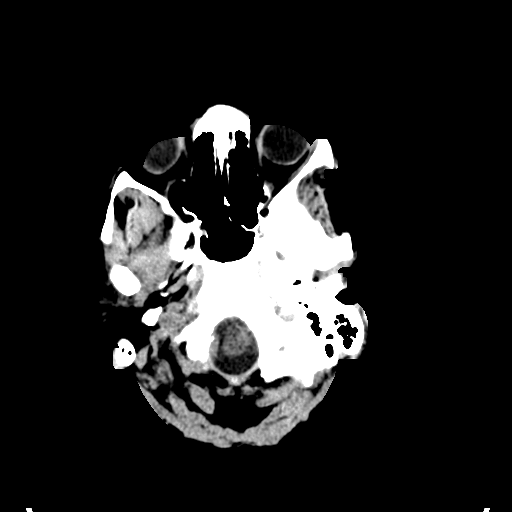
[im 3/29  bone]
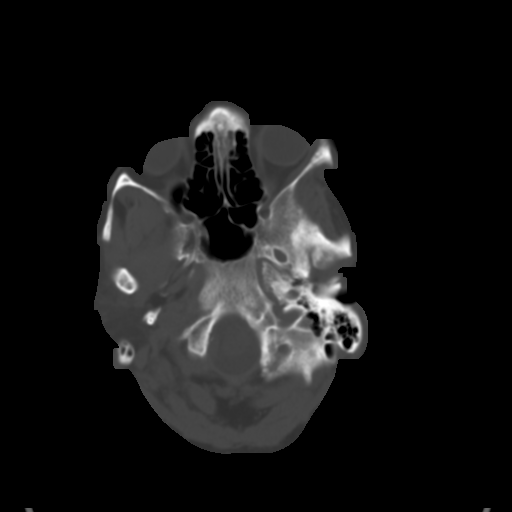
[im 6/29  brain]
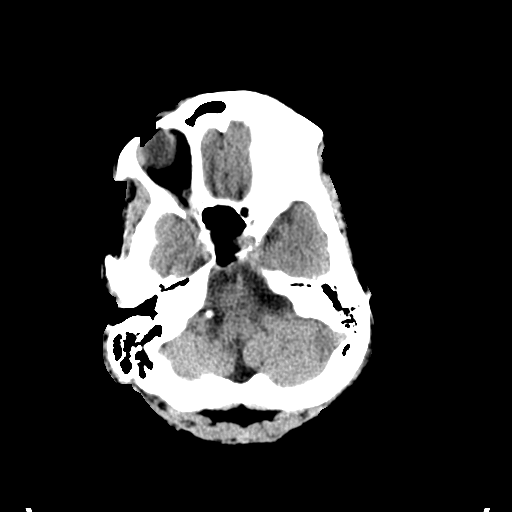
[im 9/29  brain]
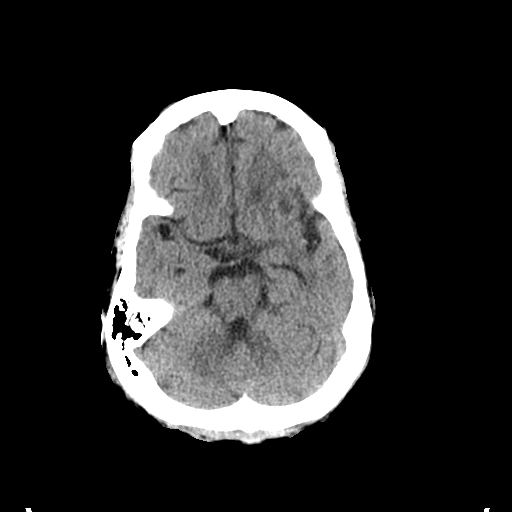
[im 12/29  brain]
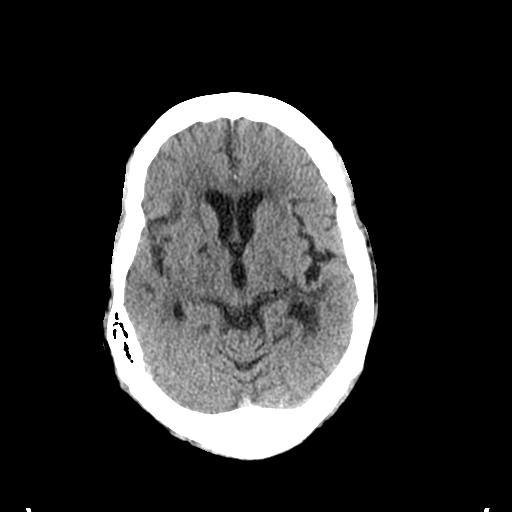
[im 15/29  brain]
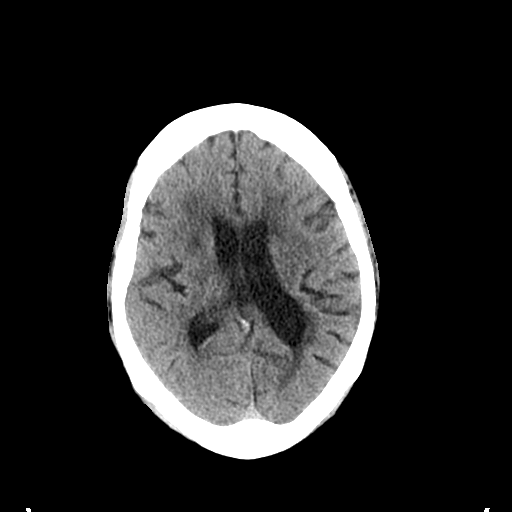
[im 15/29  bone]
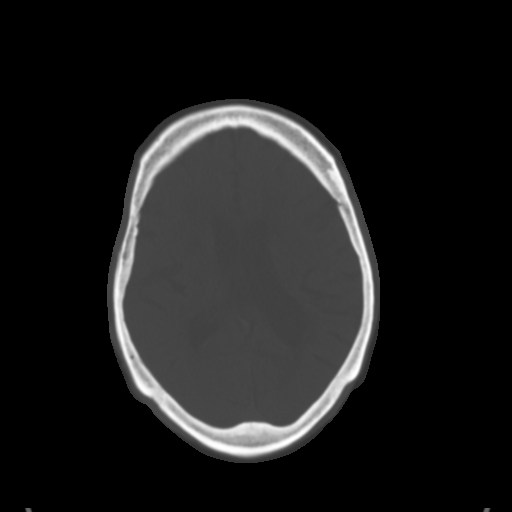
[im 18/29  brain]
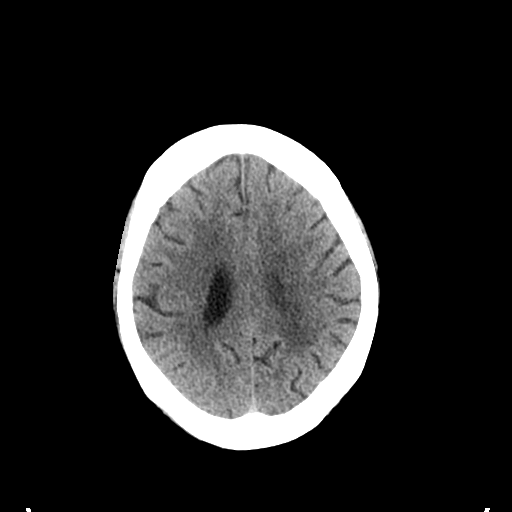
[im 21/29  brain]
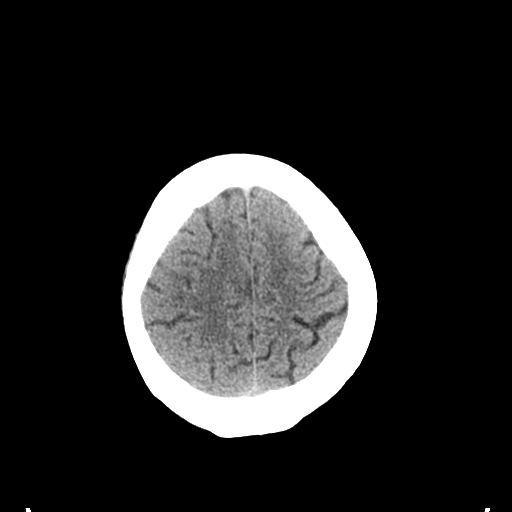
[im 24/29  brain]
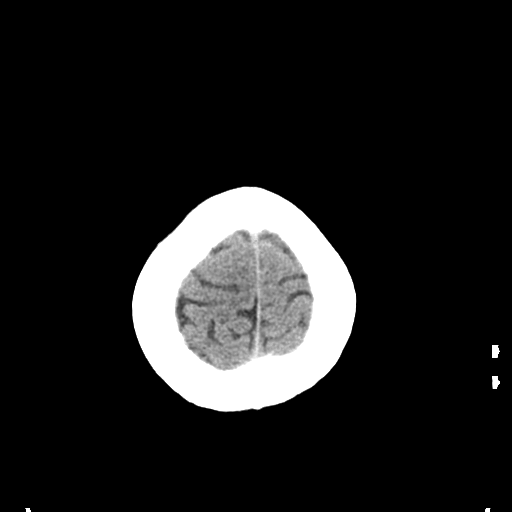
[im 27/29  brain]
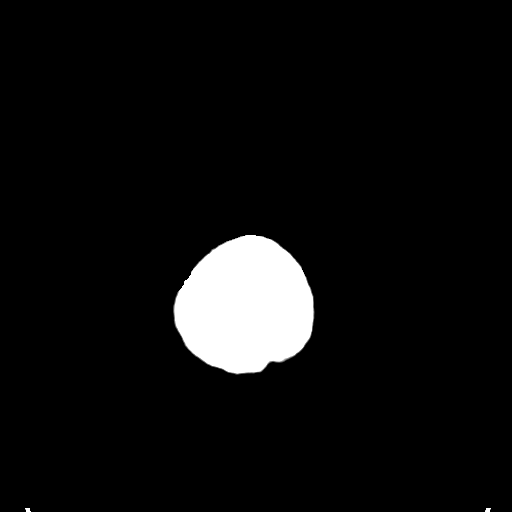
[im 27/29  bone]
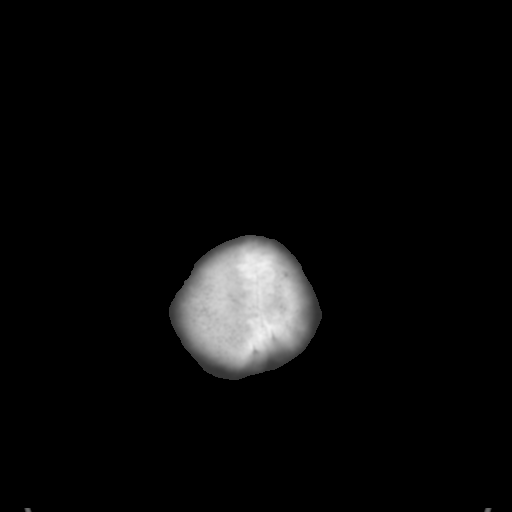

[Series 4: coronal soft tissue · coronal · 0.28mm/px · 3 of 61 slices shown]
[im 21/61  brain]
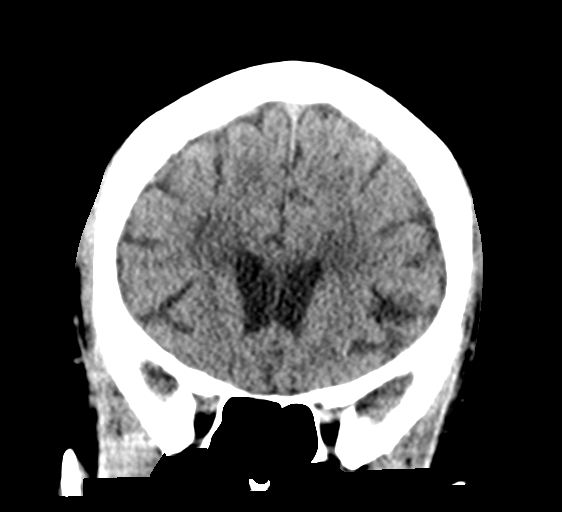
[im 27/61  brain]
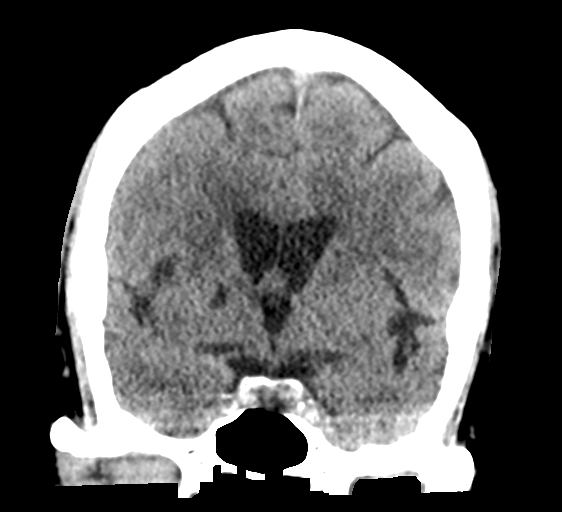
[im 34/61  brain]
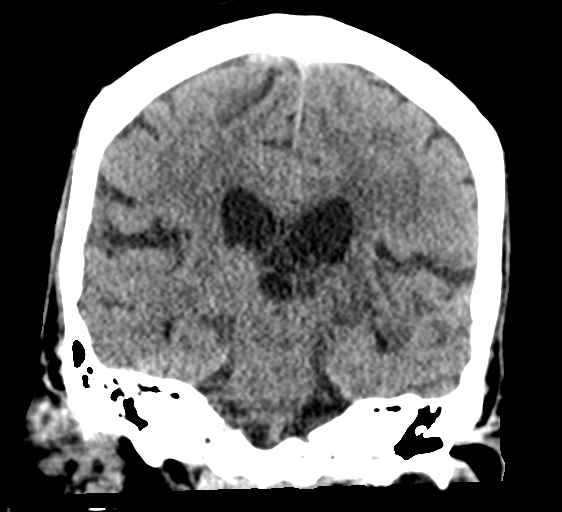

[Series 5: sagittal soft tissue · sagittal · 0.28mm/px · 3 of 53 slices shown]
[im 18/53  brain]
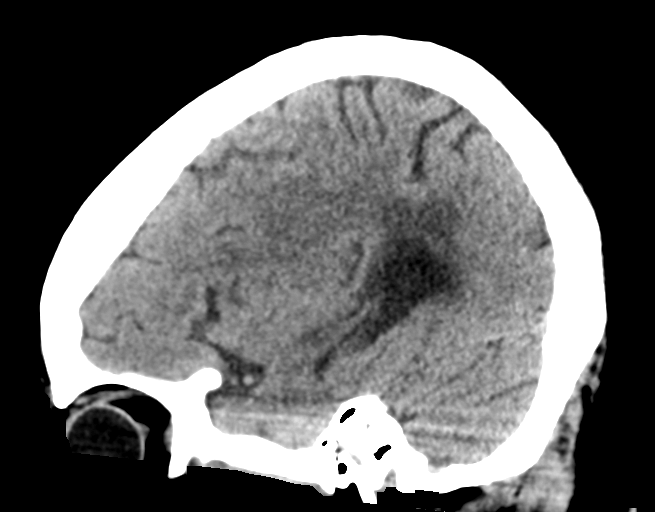
[im 27/53  brain]
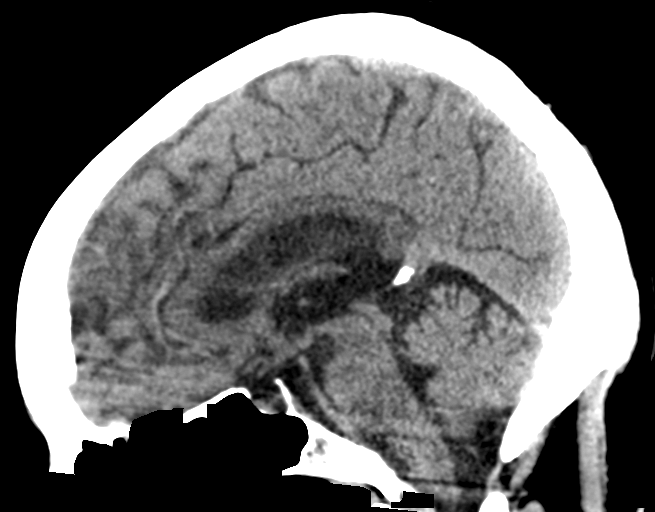
[im 35/53  brain]
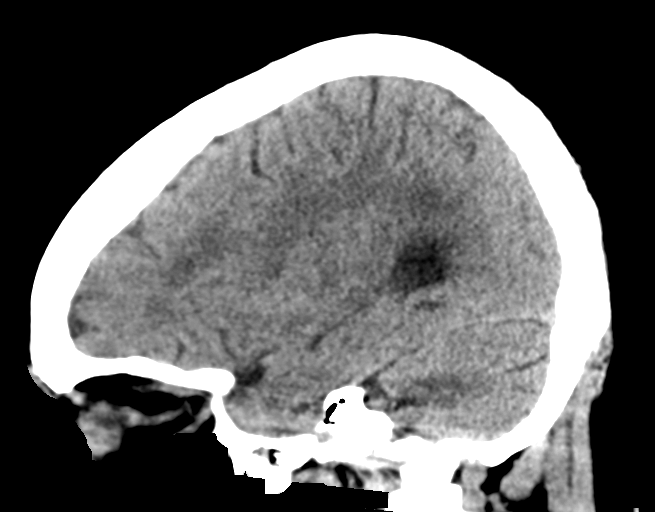

[15 of 46 positions shown; findings below may reference images not displayed]

FINDINGS: Brain: No acute territorial infarction, hemorrhage or intracranial
mass is visualized. Moderate small vessel ischemic changes of the
white matter. Old appearing lacunar infarcts in the right basal
ganglia and left caudate. Probable old left posterior white matter
injury with mild asymmetric enlargement of the left atria.

Vascular: No hyperdense vessels.  Carotid vascular calcification.

Skull: No fracture or suspicious lesion

Sinuses/Orbits: No acute finding.

Other: None
IMPRESSION: 1. No definite CT evidence for acute intracranial abnormality.
2. Small vessel ischemic changes of the white matter and old
appearing bilateral acute infarcts.

## 2018-05-02 IMAGING — CR DG CHEST 2V
2 series · 2 of 2 positions shown · non-contrast
Comparison: 08/29/2015

CLINICAL DATA: Altered mental status

EXAM:
CHEST - 2 VIEW

[w chest lat]
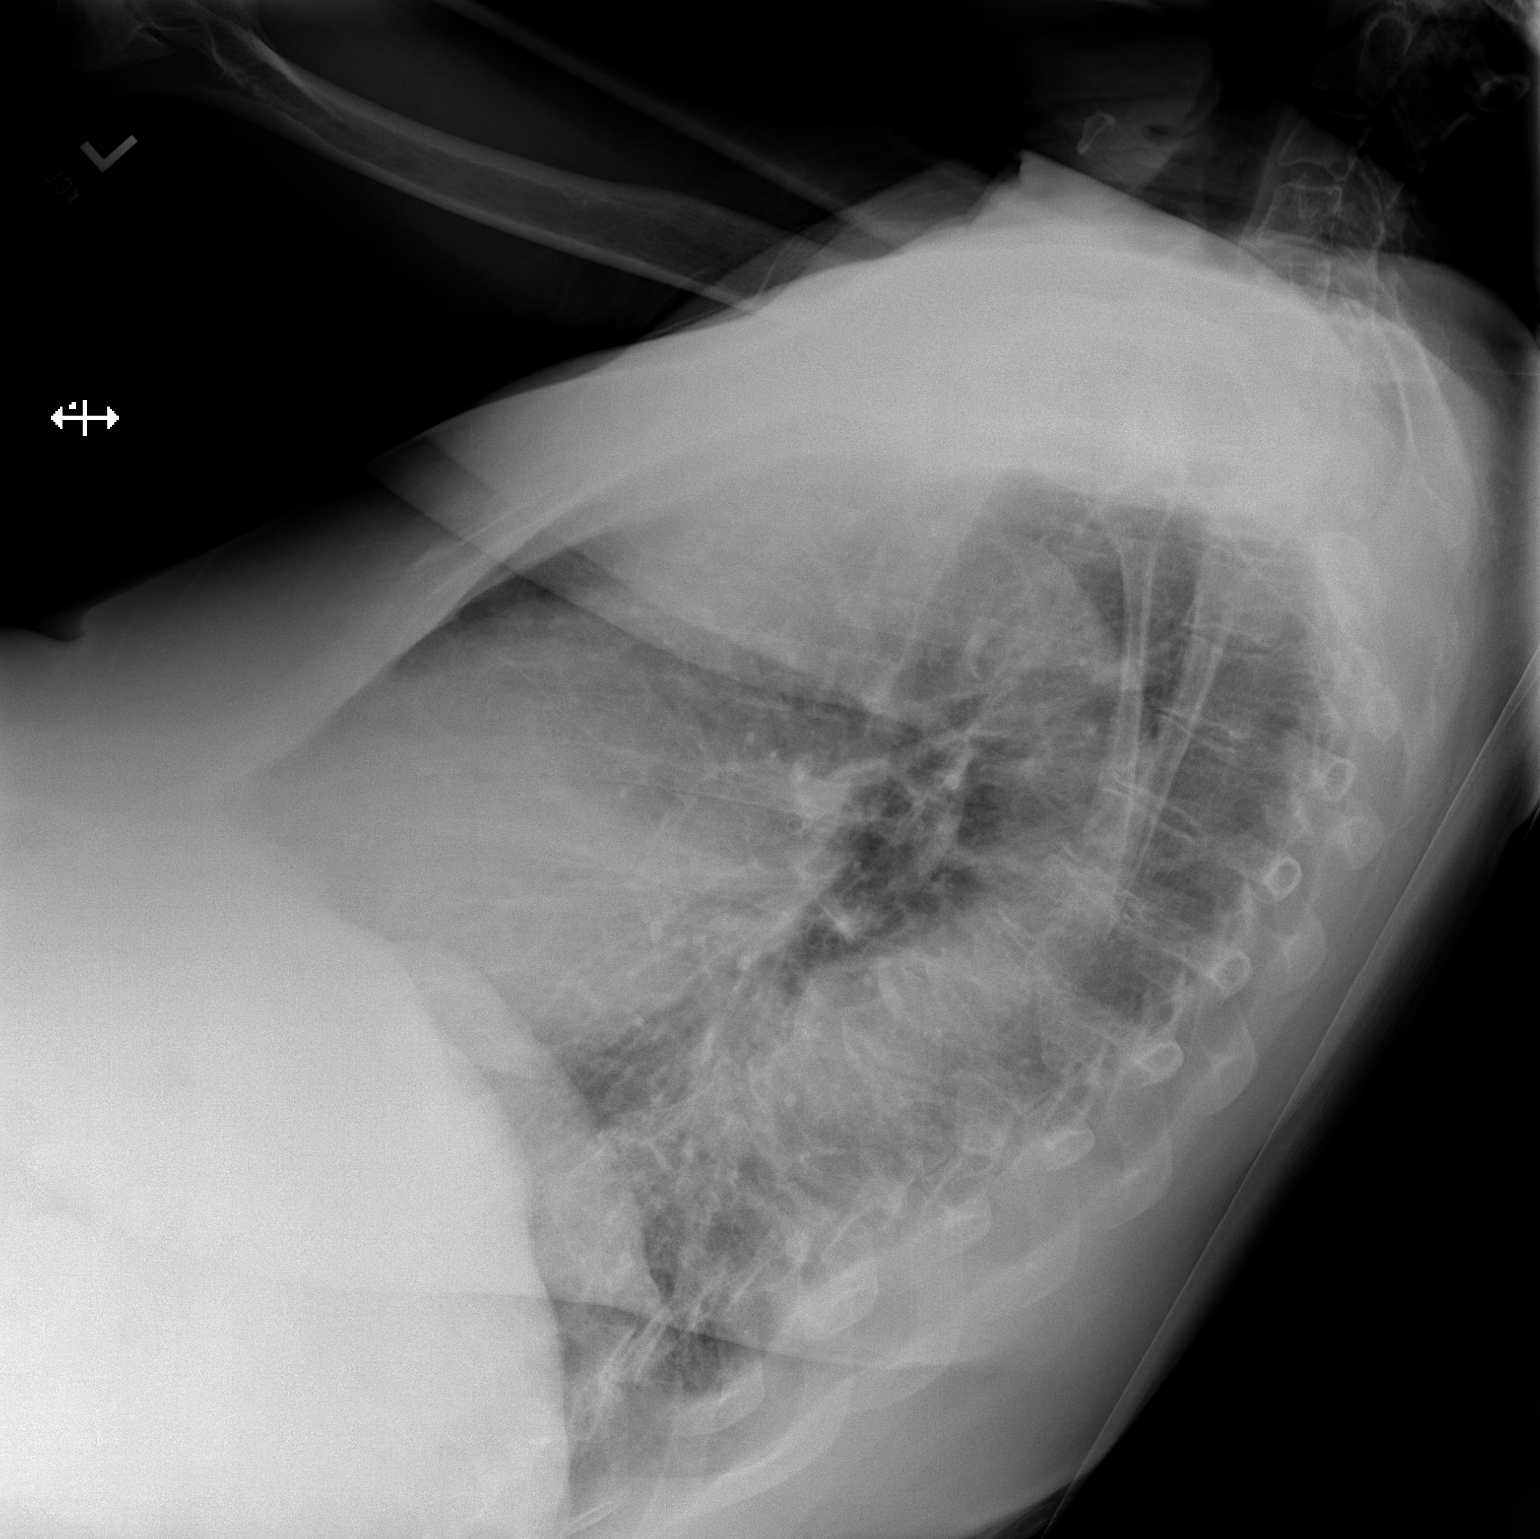

[x chest ap]
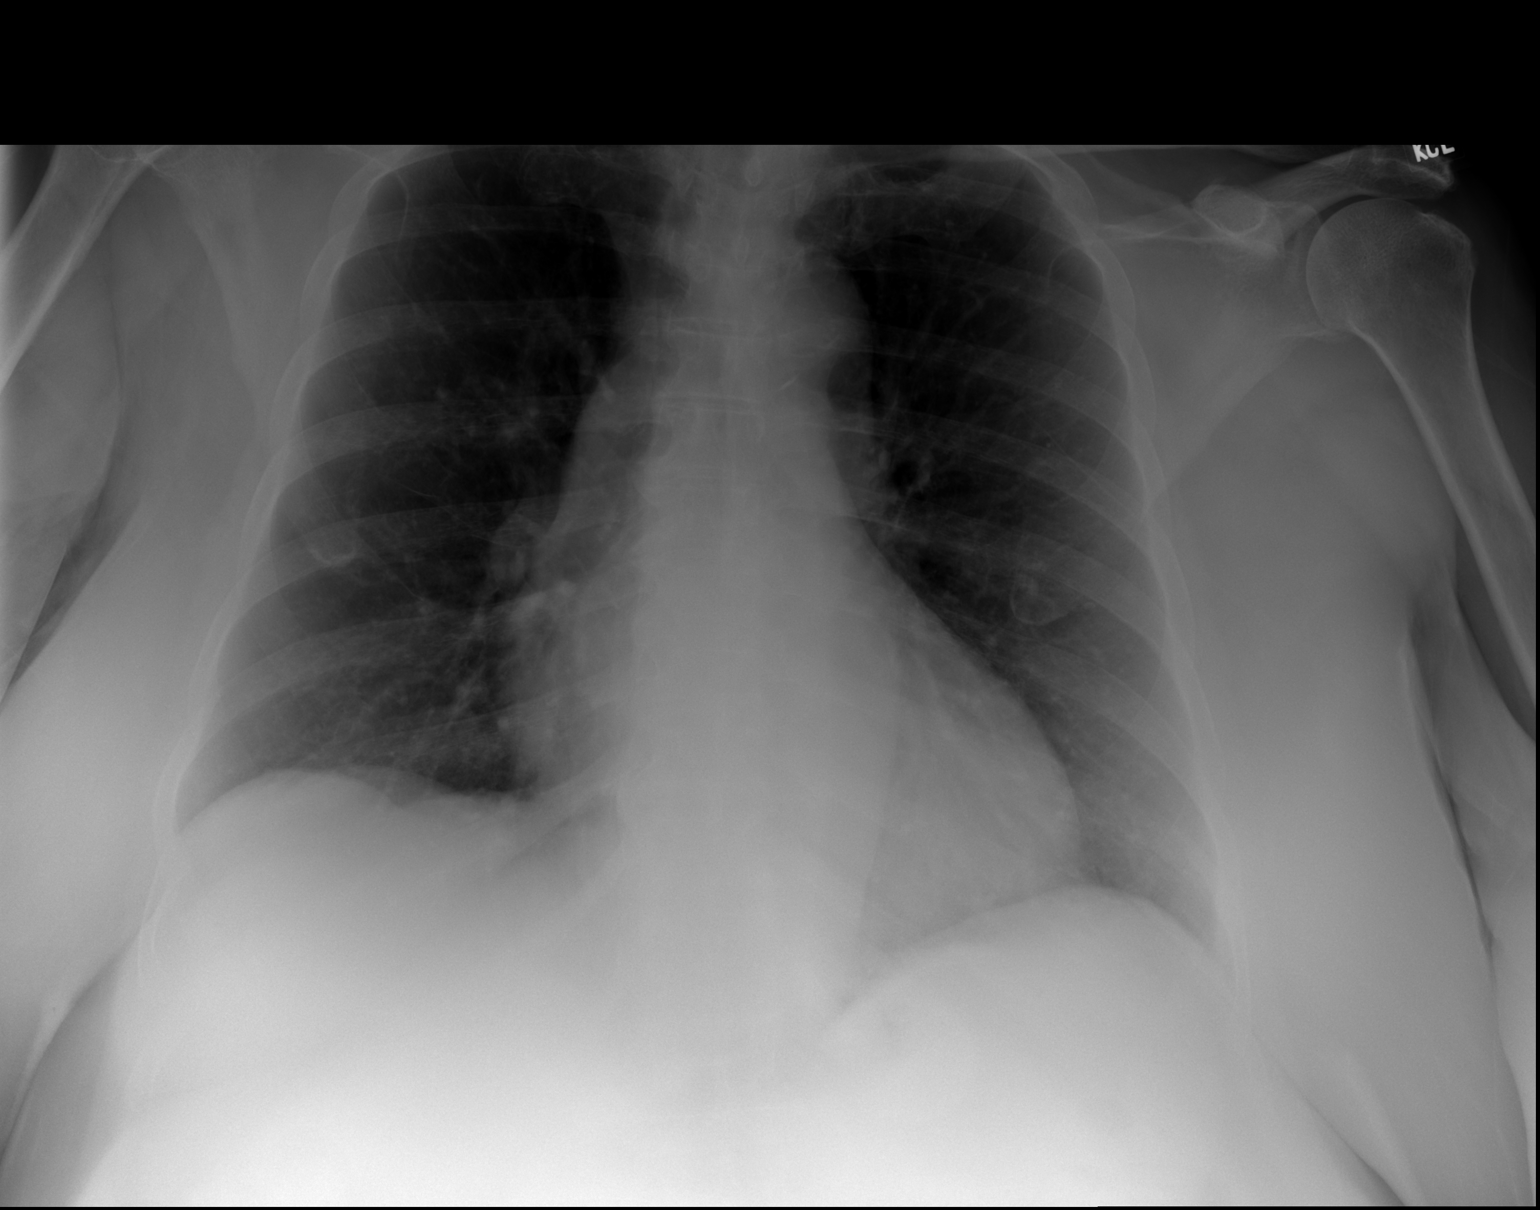

[2 of 2 positions shown; findings below may reference images not displayed]

FINDINGS: No acute pulmonary infiltrate or effusion. Mild cardiomegaly with
aortic atherosclerosis. No pneumothorax. Degenerative changes of the
spine.
IMPRESSION: No active cardiopulmonary disease.  Mild cardiomegaly.
